# Patient Record
Sex: Female | Born: 1935 | Race: White | Hispanic: No | Smoking: Never smoker
Health system: Southern US, Community
[De-identification: ages and names within clinical notes are randomized; demographics above are authoritative.]

## PROBLEM LIST (undated history)

## (undated) DIAGNOSIS — R519 Headache, unspecified: Secondary | ICD-10-CM

## (undated) DIAGNOSIS — F419 Anxiety disorder, unspecified: Secondary | ICD-10-CM

## (undated) DIAGNOSIS — I10 Essential (primary) hypertension: Secondary | ICD-10-CM

## (undated) DIAGNOSIS — R51 Headache: Secondary | ICD-10-CM

## (undated) DIAGNOSIS — M1990 Unspecified osteoarthritis, unspecified site: Secondary | ICD-10-CM

## (undated) DIAGNOSIS — E039 Hypothyroidism, unspecified: Secondary | ICD-10-CM

## (undated) DIAGNOSIS — N189 Chronic kidney disease, unspecified: Secondary | ICD-10-CM

## (undated) DIAGNOSIS — R0600 Dyspnea, unspecified: Secondary | ICD-10-CM

## (undated) DIAGNOSIS — D649 Anemia, unspecified: Secondary | ICD-10-CM

## (undated) HISTORY — PX: APPENDECTOMY: SHX54

## (undated) HISTORY — PX: EYE SURGERY: SHX253

## (undated) HISTORY — PX: TONSILLECTOMY: SUR1361

---

## 1998-09-25 ENCOUNTER — Other Ambulatory Visit: Admission: RE | Admit: 1998-09-25 | Discharge: 1998-09-25 | Payer: Self-pay | Admitting: Family Medicine

## 1999-04-28 ENCOUNTER — Other Ambulatory Visit: Admission: RE | Admit: 1999-04-28 | Discharge: 1999-04-28 | Payer: Self-pay | Admitting: Obstetrics and Gynecology

## 1999-07-18 ENCOUNTER — Other Ambulatory Visit: Admission: RE | Admit: 1999-07-18 | Discharge: 1999-07-18 | Payer: Self-pay | Admitting: Obstetrics and Gynecology

## 1999-07-18 ENCOUNTER — Encounter (INDEPENDENT_AMBULATORY_CARE_PROVIDER_SITE_OTHER): Payer: Self-pay

## 1999-10-08 ENCOUNTER — Other Ambulatory Visit: Admission: RE | Admit: 1999-10-08 | Discharge: 1999-10-08 | Payer: Self-pay | Admitting: Family Medicine

## 2000-01-28 ENCOUNTER — Encounter: Admission: RE | Admit: 2000-01-28 | Discharge: 2000-01-28 | Payer: Self-pay | Admitting: Family Medicine

## 2000-01-28 ENCOUNTER — Encounter: Payer: Self-pay | Admitting: Family Medicine

## 2000-10-08 ENCOUNTER — Encounter: Admission: RE | Admit: 2000-10-08 | Discharge: 2000-10-08 | Payer: Self-pay | Admitting: Family Medicine

## 2000-10-08 ENCOUNTER — Other Ambulatory Visit: Admission: RE | Admit: 2000-10-08 | Discharge: 2000-10-08 | Payer: Self-pay | Admitting: Family Medicine

## 2000-10-08 ENCOUNTER — Encounter: Payer: Self-pay | Admitting: Family Medicine

## 2000-12-16 ENCOUNTER — Ambulatory Visit (HOSPITAL_COMMUNITY): Admission: RE | Admit: 2000-12-16 | Discharge: 2000-12-16 | Payer: Self-pay | Admitting: *Deleted

## 2001-01-28 ENCOUNTER — Encounter: Payer: Self-pay | Admitting: Family Medicine

## 2001-01-28 ENCOUNTER — Encounter: Admission: RE | Admit: 2001-01-28 | Discharge: 2001-01-28 | Payer: Self-pay | Admitting: Family Medicine

## 2001-11-14 ENCOUNTER — Other Ambulatory Visit: Admission: RE | Admit: 2001-11-14 | Discharge: 2001-11-14 | Payer: Self-pay | Admitting: Family Medicine

## 2002-01-31 ENCOUNTER — Encounter: Admission: RE | Admit: 2002-01-31 | Discharge: 2002-01-31 | Payer: Self-pay | Admitting: Family Medicine

## 2002-01-31 ENCOUNTER — Encounter: Payer: Self-pay | Admitting: Family Medicine

## 2002-04-13 ENCOUNTER — Encounter: Admission: RE | Admit: 2002-04-13 | Discharge: 2002-04-13 | Payer: Self-pay | Admitting: Family Medicine

## 2002-04-13 ENCOUNTER — Encounter: Payer: Self-pay | Admitting: Family Medicine

## 2002-12-06 ENCOUNTER — Other Ambulatory Visit: Admission: RE | Admit: 2002-12-06 | Discharge: 2002-12-06 | Payer: Self-pay | Admitting: Family Medicine

## 2003-04-18 ENCOUNTER — Encounter: Payer: Self-pay | Admitting: Family Medicine

## 2003-04-18 ENCOUNTER — Encounter: Admission: RE | Admit: 2003-04-18 | Discharge: 2003-04-18 | Payer: Self-pay | Admitting: Family Medicine

## 2004-04-24 ENCOUNTER — Encounter: Admission: RE | Admit: 2004-04-24 | Discharge: 2004-04-24 | Payer: Self-pay | Admitting: Family Medicine

## 2004-07-14 ENCOUNTER — Other Ambulatory Visit: Admission: RE | Admit: 2004-07-14 | Discharge: 2004-07-14 | Payer: Self-pay | Admitting: Family Medicine

## 2005-01-02 ENCOUNTER — Emergency Department (HOSPITAL_COMMUNITY): Admission: EM | Admit: 2005-01-02 | Discharge: 2005-01-02 | Payer: Self-pay

## 2005-05-11 ENCOUNTER — Encounter: Admission: RE | Admit: 2005-05-11 | Discharge: 2005-05-11 | Payer: Self-pay | Admitting: Family Medicine

## 2005-05-19 ENCOUNTER — Encounter: Admission: RE | Admit: 2005-05-19 | Discharge: 2005-05-19 | Payer: Self-pay | Admitting: Family Medicine

## 2006-06-10 ENCOUNTER — Encounter: Admission: RE | Admit: 2006-06-10 | Discharge: 2006-06-10 | Payer: Self-pay | Admitting: Family Medicine

## 2007-03-24 ENCOUNTER — Ambulatory Visit (HOSPITAL_COMMUNITY): Admission: RE | Admit: 2007-03-24 | Discharge: 2007-03-24 | Payer: Self-pay | Admitting: Orthopedic Surgery

## 2007-04-15 ENCOUNTER — Encounter: Admission: RE | Admit: 2007-04-15 | Discharge: 2007-07-14 | Payer: Self-pay | Admitting: Orthopedic Surgery

## 2007-06-13 ENCOUNTER — Encounter: Admission: RE | Admit: 2007-06-13 | Discharge: 2007-06-13 | Payer: Self-pay | Admitting: Family Medicine

## 2008-06-21 ENCOUNTER — Encounter: Admission: RE | Admit: 2008-06-21 | Discharge: 2008-06-21 | Payer: Self-pay | Admitting: Family Medicine

## 2008-06-29 ENCOUNTER — Encounter: Admission: RE | Admit: 2008-06-29 | Discharge: 2008-06-29 | Payer: Self-pay | Admitting: Family Medicine

## 2009-07-18 ENCOUNTER — Encounter: Admission: RE | Admit: 2009-07-18 | Discharge: 2009-07-18 | Payer: Self-pay | Admitting: Family Medicine

## 2010-07-21 ENCOUNTER — Encounter: Admission: RE | Admit: 2010-07-21 | Discharge: 2010-07-21 | Payer: Self-pay | Admitting: Family Medicine

## 2010-07-24 ENCOUNTER — Encounter: Admission: RE | Admit: 2010-07-24 | Discharge: 2010-07-24 | Payer: Self-pay | Admitting: Family Medicine

## 2010-12-28 ENCOUNTER — Encounter: Payer: Self-pay | Admitting: Family Medicine

## 2010-12-29 ENCOUNTER — Encounter: Payer: Self-pay | Admitting: Family Medicine

## 2011-04-24 NOTE — Op Note (Signed)
NAMESHELAGH, BROUSE                 ACCOUNT NO.:  [id number]   MEDICAL RECORD NO.:  [id number]          PATIENT TYPE:  AMB   LOCATION:  DAY                          FACILITY:  [facility]   PHYSICIAN:  Pietro Cassis. Alvan Dame, M.D.  DATE OF BIRTH:  1929-09-05   DATE OF PROCEDURE:  03/24/2007  DATE OF DISCHARGE:                               OPERATIVE REPORT   PREOPERATIVE DIAGNOSIS:  Right knee medial and lateral meniscal tear  associated with chondromalacia medially.   POSTOPERATIVE DIAGNOSES/FINDINGS:  1. Some grade 2-3 changes noted to the distal weightbearing surface of      the medial femoral condyle, not extensive and no evidence of      eburnated bone, tibial plateau surface appeared to be relatively      intact, perhaps some grade 2 changes at best.  2. Posterior horn medial meniscal tear.  3. Anterior horn mid body junction lateral meniscal tear.  4. Anterior medial synovitis.   PROCEDURES:  Right knee diagnostic and operative arthroscopy with a  1. Medial and lateral partial meniscectomies.  2. Limited synovectomy.  3. Medial chondroplasty.   SURGEON:  Pietro Cassis. Alvan Dame, M.D.   ASSISTANT:  None.   ANESTHESIA:  General plus locally administered lidocaine preoperatively  and postop Marcaine.   BLOOD LOSS:  None.   COMPLICATIONS:  None.   INDICATIONS:  Alicia Sellers is a very pleasant 77 year old female who I had  seen over year ago with right knee discomfort.  Based on radiographic  appearance of preserved joint spaces, she wished to identify the certain  pathology and this did indicate meniscal pathology.  We reviewed this  treatment options and she had tried conservative measures for some time  but had persistent and recurring mechanical type symptoms.  Given the  MRI findings, she wished to proceed with surgical intervention.  We  reviewed arthroscopic surgery versus knee replacement as an option.  She  wished to proceed that way.  Risks of persistent or progression of her  arthritis as well as recurring tears or symptoms inside the knee were  all reviewed as well as the  postop course and expectations, consent  obtained.   PROCEDURE IN DETAIL:  The patient was brought to the operative theater.  Once adequate anesthesia and appropriate preoperative antibiotics  administered, the patient was positioned supine with the right leg in a  leg holder, right lower extremity was prepped and draped in sterile  fashion.  Standard inferomedial, superomedial, and inferolateral portals  were utilized.  Diagnostic evaluation of the knee was carried out  revealing the above-noted findings.  Probe examination of the posterior  horn medial meniscal tear was identified.  Small biting basket was then  used to debride this area and then further contoured with a 3.5 shaver.  The 3.5 shaver was also used to debride the anterior medial synovium,  further exposing some anteromedial degenerative changes noted on the  femoral condyle.  This was debrided back primarily some of these flaps.  There was no microfracture carried out due to the fact there was some  preserved cartilage underlying this.  Laterally, the 3.5 shaver was used for debridement initially but I then  used a biting basket to trim out some of the junction of the mid body  anterior horn to further stabilize this.  The fragments were removed  with the shaver.  Anterior medial synovectomy was carried out revealing  a relatively intact patellofemoral compartment.   Final diagnosis for Alicia Sellers included relatively intact patellofemoral  lateral compartment, great 2-3 changes over the distal weightbearing  aspect and the anterior aspect of the femoral condyle with a relatively  intact tibial plateau surface at this time.   Following these findings, the instrumentation was removed.  Portals were  reapproximated using a 4-0 nylon.  I then injected the knee with 0.25%  Marcaine with epinephrine.  The patient entered had  her knee cleaned,  dried and dressed sterilely with a sterile bulky wrap.  She was brought  to the recovery room in stable condition.   I will see her back in the office in 10 days for routine follow-up.      Pietro Cassis Alvan Dame, M.D.  Electronically Signed     MDO/MEDQ  D:  03/24/2007  T:  03/24/2007  Job:  [id number]

## 2011-08-14 ENCOUNTER — Other Ambulatory Visit: Payer: Self-pay | Admitting: Family Medicine

## 2011-08-14 DIAGNOSIS — Z1231 Encounter for screening mammogram for malignant neoplasm of breast: Secondary | ICD-10-CM

## 2011-08-28 ENCOUNTER — Ambulatory Visit
Admission: RE | Admit: 2011-08-28 | Discharge: 2011-08-28 | Disposition: A | Discharge status: Home / Self Care | Payer: Managed Care, Other (non HMO) | Source: Ambulatory Visit | Attending: Family Medicine | Admitting: Family Medicine

## 2011-08-28 DIAGNOSIS — Z1231 Encounter for screening mammogram for malignant neoplasm of breast: Secondary | ICD-10-CM

## 2012-01-06 ENCOUNTER — Ambulatory Visit (INDEPENDENT_AMBULATORY_CARE_PROVIDER_SITE_OTHER): Payer: Managed Care, Other (non HMO) | Admitting: Family Medicine

## 2012-01-06 VITALS — BP 138/70 | Ht 61.0 in | Wt 163.0 lb

## 2012-01-06 DIAGNOSIS — M129 Arthropathy, unspecified: Secondary | ICD-10-CM

## 2012-01-06 DIAGNOSIS — M1990 Unspecified osteoarthritis, unspecified site: Secondary | ICD-10-CM | POA: Insufficient documentation

## 2012-01-06 MED ORDER — KETOPROFEN POWD: Status: DC

## 2012-01-06 NOTE — Patient Instructions (Signed)
1.  You can pick prescription at [facility] in [facility].  2.  You can use this gel on any of the places that you have arthritis, including your knee.  3. Follow up with me in 3-4 weeks.  4. Don't forget to do your hand squeezes everyday.

## 2012-01-06 NOTE — Progress Notes (Signed)
  Subjective:    Patient ID: Alicia Sellers, female    DOB: 1929-01-03, 78 y.o.   MRN: [id number]  HPI 78 y/o female is here c/o intermittent pain in the right thumb since October.  She has a history of arthritis in her hand but has not had pain in this particular area.  She is wondering if she injured herself using an exercise band last October because that is when the pain started.  She hasn't tried anything in particular for treatment.  She doesn't take any medication specifically for her arthritis because it doesn't bother her much over all    Review of Systems     Objective:   Physical Exam  There are changes c/w arthritis on multiple IP joints of both hands There is squaring of the CMC bilaterally, right greater than left. There is tenderness to palpation of the [facility] on the right that reproduces the pain in her chief complaint She has good range of motion at the wrist and hand The strength is normal PNVI  Ultrasound shows calcifications and spurring at the [facility].  There is a small fluid collection noted as well      Assessment & Plan:

## 2012-01-06 NOTE — Assessment & Plan Note (Signed)
We will start with topical ketoprofen.  Discussed other treatment options including oral anti-inflammatory meds and injection therapy.  She would like to try topicals first.  Additionally she will do hand ROM daily.

## 2012-02-01 ENCOUNTER — Ambulatory Visit (INDEPENDENT_AMBULATORY_CARE_PROVIDER_SITE_OTHER): Payer: Medicare Other | Admitting: Sports Medicine

## 2012-02-01 VITALS — BP 128/70

## 2012-02-01 DIAGNOSIS — M129 Arthropathy, unspecified: Secondary | ICD-10-CM

## 2012-02-01 DIAGNOSIS — M1990 Unspecified osteoarthritis, unspecified site: Secondary | ICD-10-CM

## 2012-02-01 NOTE — Progress Notes (Signed)
  Subjective:    Patient ID: Alicia Sellers, female    DOB: [birth year], 78 y.o.   MRN: [id number]  HPI 78 y/o female is here for follow up for arthritis.  She is improved with the ketaprofen gel and ROM HEP.  She has more good days than bad.   She is able to do normal activity  Pain is basically over thumbs and 1st CMC - distal hand feels OK  Not using heat although this feels good to her   Review of Systems     Objective:   Physical Exam Pleasant, NAD  There are changes c/w arthritis on multiple IP joints of both hands  There is squaring of the CMC bilaterally, right greater than left.  There is tenderness to palpation of the [facility] on the right that reproduces the pain in her chief complaint  She has good range of motion at the wrist and hand considering her arthritic changes        Assessment & Plan:

## 2012-02-01 NOTE — Assessment & Plan Note (Signed)
We will continue with ketoprofen gel and ROM, adding once daily hand therapy with warm water.  If her pain worsens we can consider other treatment options.

## 2012-02-01 NOTE — Patient Instructions (Signed)
1. Keep doing your hand motion with your stuffed toy.  2. Once a day do your hand motion in warm water.  3. You can use gel on any joint that bothers you.  4. Come back to see [place] if you have any other joint or muscle problems.

## 2012-08-17 ENCOUNTER — Other Ambulatory Visit: Payer: Self-pay | Admitting: Family Medicine

## 2012-08-17 DIAGNOSIS — Z1231 Encounter for screening mammogram for malignant neoplasm of breast: Secondary | ICD-10-CM

## 2012-08-29 ENCOUNTER — Ambulatory Visit: Payer: Medicare Other

## 2012-09-05 ENCOUNTER — Ambulatory Visit
Admission: RE | Admit: 2012-09-05 | Discharge: 2012-09-05 | Disposition: A | Discharge status: Home / Self Care | Payer: Medicare Other | Source: Ambulatory Visit | Attending: Family Medicine | Admitting: Family Medicine

## 2012-09-05 DIAGNOSIS — Z1231 Encounter for screening mammogram for malignant neoplasm of breast: Secondary | ICD-10-CM

## 2012-12-13 DIAGNOSIS — L608 Other nail disorders: Secondary | ICD-10-CM | POA: Diagnosis not present

## 2012-12-14 DIAGNOSIS — M949 Disorder of cartilage, unspecified: Secondary | ICD-10-CM | POA: Diagnosis not present

## 2012-12-14 DIAGNOSIS — N184 Chronic kidney disease, stage 4 (severe): Secondary | ICD-10-CM | POA: Diagnosis not present

## 2012-12-14 DIAGNOSIS — I10 Essential (primary) hypertension: Secondary | ICD-10-CM | POA: Diagnosis not present

## 2012-12-14 DIAGNOSIS — Z1331 Encounter for screening for depression: Secondary | ICD-10-CM | POA: Diagnosis not present

## 2012-12-14 DIAGNOSIS — E7800 Pure hypercholesterolemia, unspecified: Secondary | ICD-10-CM | POA: Diagnosis not present

## 2012-12-14 DIAGNOSIS — Z79899 Other long term (current) drug therapy: Secondary | ICD-10-CM | POA: Diagnosis not present

## 2012-12-14 DIAGNOSIS — R42 Dizziness and giddiness: Secondary | ICD-10-CM | POA: Diagnosis not present

## 2012-12-14 DIAGNOSIS — M899 Disorder of bone, unspecified: Secondary | ICD-10-CM | POA: Diagnosis not present

## 2012-12-16 DIAGNOSIS — E875 Hyperkalemia: Secondary | ICD-10-CM | POA: Diagnosis not present

## 2012-12-19 ENCOUNTER — Other Ambulatory Visit: Payer: Self-pay | Admitting: Family Medicine

## 2012-12-19 ENCOUNTER — Ambulatory Visit
Admission: RE | Admit: 2012-12-19 | Discharge: 2012-12-19 | Disposition: A | Discharge status: Home / Self Care | Payer: Medicare Other | Source: Ambulatory Visit | Attending: Family Medicine | Admitting: Family Medicine

## 2012-12-19 DIAGNOSIS — N184 Chronic kidney disease, stage 4 (severe): Secondary | ICD-10-CM

## 2012-12-19 DIAGNOSIS — N281 Cyst of kidney, acquired: Secondary | ICD-10-CM | POA: Diagnosis not present

## 2012-12-19 DIAGNOSIS — I129 Hypertensive chronic kidney disease with stage 1 through stage 4 chronic kidney disease, or unspecified chronic kidney disease: Secondary | ICD-10-CM | POA: Diagnosis not present

## 2012-12-20 ENCOUNTER — Other Ambulatory Visit: Payer: Medicare Other

## 2013-03-14 DIAGNOSIS — L608 Other nail disorders: Secondary | ICD-10-CM | POA: Diagnosis not present

## 2013-03-20 DIAGNOSIS — R109 Unspecified abdominal pain: Secondary | ICD-10-CM | POA: Diagnosis not present

## 2013-05-19 DIAGNOSIS — I10 Essential (primary) hypertension: Secondary | ICD-10-CM | POA: Diagnosis not present

## 2013-05-22 DIAGNOSIS — R350 Frequency of micturition: Secondary | ICD-10-CM | POA: Diagnosis not present

## 2013-05-22 DIAGNOSIS — I10 Essential (primary) hypertension: Secondary | ICD-10-CM | POA: Diagnosis not present

## 2013-06-08 DIAGNOSIS — D485 Neoplasm of uncertain behavior of skin: Secondary | ICD-10-CM | POA: Diagnosis not present

## 2013-06-08 DIAGNOSIS — M899 Disorder of bone, unspecified: Secondary | ICD-10-CM | POA: Diagnosis not present

## 2013-06-08 DIAGNOSIS — N184 Chronic kidney disease, stage 4 (severe): Secondary | ICD-10-CM | POA: Diagnosis not present

## 2013-06-08 DIAGNOSIS — Z79899 Other long term (current) drug therapy: Secondary | ICD-10-CM | POA: Diagnosis not present

## 2013-06-08 DIAGNOSIS — I10 Essential (primary) hypertension: Secondary | ICD-10-CM | POA: Diagnosis not present

## 2013-06-08 DIAGNOSIS — D492 Neoplasm of unspecified behavior of bone, soft tissue, and skin: Secondary | ICD-10-CM | POA: Diagnosis not present

## 2013-06-08 DIAGNOSIS — E7800 Pure hypercholesterolemia, unspecified: Secondary | ICD-10-CM | POA: Diagnosis not present

## 2013-06-13 DIAGNOSIS — L608 Other nail disorders: Secondary | ICD-10-CM | POA: Diagnosis not present

## 2013-07-10 DIAGNOSIS — H2510 Age-related nuclear cataract, unspecified eye: Secondary | ICD-10-CM | POA: Diagnosis not present

## 2013-08-31 ENCOUNTER — Other Ambulatory Visit: Payer: Self-pay

## 2013-08-31 DIAGNOSIS — Z1231 Encounter for screening mammogram for malignant neoplasm of breast: Secondary | ICD-10-CM

## 2013-09-15 DIAGNOSIS — Z23 Encounter for immunization: Secondary | ICD-10-CM | POA: Diagnosis not present

## 2013-09-18 ENCOUNTER — Ambulatory Visit
Admission: RE | Admit: 2013-09-18 | Discharge: 2013-09-18 | Disposition: A | Discharge status: Home / Self Care | Payer: Medicare Other | Source: Ambulatory Visit

## 2013-09-18 DIAGNOSIS — Z1231 Encounter for screening mammogram for malignant neoplasm of breast: Secondary | ICD-10-CM | POA: Diagnosis not present

## 2013-09-24 DIAGNOSIS — L2089 Other atopic dermatitis: Secondary | ICD-10-CM | POA: Diagnosis not present

## 2013-10-18 DIAGNOSIS — R51 Headache: Secondary | ICD-10-CM | POA: Diagnosis not present

## 2013-10-18 DIAGNOSIS — N184 Chronic kidney disease, stage 4 (severe): Secondary | ICD-10-CM | POA: Diagnosis not present

## 2013-10-18 DIAGNOSIS — I129 Hypertensive chronic kidney disease with stage 1 through stage 4 chronic kidney disease, or unspecified chronic kidney disease: Secondary | ICD-10-CM | POA: Diagnosis not present

## 2013-10-24 DIAGNOSIS — H8110 Benign paroxysmal vertigo, unspecified ear: Secondary | ICD-10-CM | POA: Diagnosis not present

## 2013-10-24 DIAGNOSIS — H6120 Impacted cerumen, unspecified ear: Secondary | ICD-10-CM | POA: Diagnosis not present

## 2013-10-31 DIAGNOSIS — H6980 Other specified disorders of Eustachian tube, unspecified ear: Secondary | ICD-10-CM | POA: Diagnosis not present

## 2013-10-31 DIAGNOSIS — H6120 Impacted cerumen, unspecified ear: Secondary | ICD-10-CM | POA: Diagnosis not present

## 2013-11-14 DIAGNOSIS — H6980 Other specified disorders of Eustachian tube, unspecified ear: Secondary | ICD-10-CM | POA: Diagnosis not present

## 2013-12-07 HISTORY — PX: BREAST BIOPSY: SHX20

## 2013-12-15 DIAGNOSIS — M949 Disorder of cartilage, unspecified: Secondary | ICD-10-CM | POA: Diagnosis not present

## 2013-12-15 DIAGNOSIS — Z79899 Other long term (current) drug therapy: Secondary | ICD-10-CM | POA: Diagnosis not present

## 2013-12-15 DIAGNOSIS — E7800 Pure hypercholesterolemia, unspecified: Secondary | ICD-10-CM | POA: Diagnosis not present

## 2013-12-15 DIAGNOSIS — M899 Disorder of bone, unspecified: Secondary | ICD-10-CM | POA: Diagnosis not present

## 2013-12-15 DIAGNOSIS — N184 Chronic kidney disease, stage 4 (severe): Secondary | ICD-10-CM | POA: Diagnosis not present

## 2013-12-15 DIAGNOSIS — I129 Hypertensive chronic kidney disease with stage 1 through stage 4 chronic kidney disease, or unspecified chronic kidney disease: Secondary | ICD-10-CM | POA: Diagnosis not present

## 2013-12-26 DIAGNOSIS — M949 Disorder of cartilage, unspecified: Secondary | ICD-10-CM | POA: Diagnosis not present

## 2013-12-26 DIAGNOSIS — M899 Disorder of bone, unspecified: Secondary | ICD-10-CM | POA: Diagnosis not present

## 2014-01-01 DIAGNOSIS — E875 Hyperkalemia: Secondary | ICD-10-CM | POA: Diagnosis not present

## 2014-01-17 DIAGNOSIS — D631 Anemia in chronic kidney disease: Secondary | ICD-10-CM | POA: Diagnosis not present

## 2014-01-17 DIAGNOSIS — N2581 Secondary hyperparathyroidism of renal origin: Secondary | ICD-10-CM | POA: Diagnosis not present

## 2014-01-17 DIAGNOSIS — N183 Chronic kidney disease, stage 3 unspecified: Secondary | ICD-10-CM | POA: Diagnosis not present

## 2014-01-17 DIAGNOSIS — N039 Chronic nephritic syndrome with unspecified morphologic changes: Secondary | ICD-10-CM | POA: Diagnosis not present

## 2014-02-05 DIAGNOSIS — E875 Hyperkalemia: Secondary | ICD-10-CM | POA: Diagnosis not present

## 2014-03-23 DIAGNOSIS — M76899 Other specified enthesopathies of unspecified lower limb, excluding foot: Secondary | ICD-10-CM | POA: Diagnosis not present

## 2014-04-24 DIAGNOSIS — N039 Chronic nephritic syndrome with unspecified morphologic changes: Secondary | ICD-10-CM | POA: Diagnosis not present

## 2014-04-24 DIAGNOSIS — N183 Chronic kidney disease, stage 3 unspecified: Secondary | ICD-10-CM | POA: Diagnosis not present

## 2014-04-24 DIAGNOSIS — D631 Anemia in chronic kidney disease: Secondary | ICD-10-CM | POA: Diagnosis not present

## 2014-04-24 DIAGNOSIS — N2581 Secondary hyperparathyroidism of renal origin: Secondary | ICD-10-CM | POA: Diagnosis not present

## 2014-06-14 DIAGNOSIS — I129 Hypertensive chronic kidney disease with stage 1 through stage 4 chronic kidney disease, or unspecified chronic kidney disease: Secondary | ICD-10-CM | POA: Diagnosis not present

## 2014-06-14 DIAGNOSIS — I998 Other disorder of circulatory system: Secondary | ICD-10-CM | POA: Diagnosis not present

## 2014-06-14 DIAGNOSIS — M899 Disorder of bone, unspecified: Secondary | ICD-10-CM | POA: Diagnosis not present

## 2014-06-14 DIAGNOSIS — Z23 Encounter for immunization: Secondary | ICD-10-CM | POA: Diagnosis not present

## 2014-06-14 DIAGNOSIS — M949 Disorder of cartilage, unspecified: Secondary | ICD-10-CM | POA: Diagnosis not present

## 2014-06-14 DIAGNOSIS — Z1331 Encounter for screening for depression: Secondary | ICD-10-CM | POA: Diagnosis not present

## 2014-06-14 DIAGNOSIS — E7800 Pure hypercholesterolemia, unspecified: Secondary | ICD-10-CM | POA: Diagnosis not present

## 2014-06-14 DIAGNOSIS — N184 Chronic kidney disease, stage 4 (severe): Secondary | ICD-10-CM | POA: Diagnosis not present

## 2014-08-01 DIAGNOSIS — M461 Sacroiliitis, not elsewhere classified: Secondary | ICD-10-CM | POA: Diagnosis not present

## 2014-08-09 DIAGNOSIS — M533 Sacrococcygeal disorders, not elsewhere classified: Secondary | ICD-10-CM | POA: Diagnosis not present

## 2014-08-10 DIAGNOSIS — M533 Sacrococcygeal disorders, not elsewhere classified: Secondary | ICD-10-CM | POA: Diagnosis not present

## 2014-08-15 DIAGNOSIS — M533 Sacrococcygeal disorders, not elsewhere classified: Secondary | ICD-10-CM | POA: Diagnosis not present

## 2014-08-17 DIAGNOSIS — M533 Sacrococcygeal disorders, not elsewhere classified: Secondary | ICD-10-CM | POA: Diagnosis not present

## 2014-08-20 DIAGNOSIS — M533 Sacrococcygeal disorders, not elsewhere classified: Secondary | ICD-10-CM | POA: Diagnosis not present

## 2014-08-24 ENCOUNTER — Other Ambulatory Visit: Payer: Self-pay

## 2014-08-24 DIAGNOSIS — M533 Sacrococcygeal disorders, not elsewhere classified: Secondary | ICD-10-CM | POA: Diagnosis not present

## 2014-08-24 DIAGNOSIS — Z1231 Encounter for screening mammogram for malignant neoplasm of breast: Secondary | ICD-10-CM

## 2014-08-29 DIAGNOSIS — M533 Sacrococcygeal disorders, not elsewhere classified: Secondary | ICD-10-CM | POA: Diagnosis not present

## 2014-08-31 DIAGNOSIS — M533 Sacrococcygeal disorders, not elsewhere classified: Secondary | ICD-10-CM | POA: Diagnosis not present

## 2014-09-03 DIAGNOSIS — M533 Sacrococcygeal disorders, not elsewhere classified: Secondary | ICD-10-CM | POA: Diagnosis not present

## 2014-09-07 DIAGNOSIS — M545 Low back pain: Secondary | ICD-10-CM | POA: Diagnosis not present

## 2014-09-12 DIAGNOSIS — M545 Low back pain: Secondary | ICD-10-CM | POA: Diagnosis not present

## 2014-09-18 DIAGNOSIS — M545 Low back pain: Secondary | ICD-10-CM | POA: Diagnosis not present

## 2014-09-20 DIAGNOSIS — H2513 Age-related nuclear cataract, bilateral: Secondary | ICD-10-CM | POA: Diagnosis not present

## 2014-09-21 ENCOUNTER — Ambulatory Visit
Admission: RE | Admit: 2014-09-21 | Discharge: 2014-09-21 | Disposition: A | Discharge status: Home / Self Care | Payer: Medicare Other | Source: Ambulatory Visit

## 2014-09-21 ENCOUNTER — Encounter (INDEPENDENT_AMBULATORY_CARE_PROVIDER_SITE_OTHER): Payer: Self-pay

## 2014-09-21 DIAGNOSIS — Z1231 Encounter for screening mammogram for malignant neoplasm of breast: Secondary | ICD-10-CM | POA: Diagnosis not present

## 2014-09-21 DIAGNOSIS — Z23 Encounter for immunization: Secondary | ICD-10-CM | POA: Diagnosis not present

## 2014-09-26 ENCOUNTER — Other Ambulatory Visit: Payer: Self-pay | Admitting: Family Medicine

## 2014-09-26 DIAGNOSIS — R928 Other abnormal and inconclusive findings on diagnostic imaging of breast: Secondary | ICD-10-CM

## 2014-10-10 ENCOUNTER — Ambulatory Visit
Admission: RE | Admit: 2014-10-10 | Discharge: 2014-10-10 | Disposition: A | Discharge status: Home / Self Care | Payer: Medicare Other | Source: Ambulatory Visit | Attending: Family Medicine | Admitting: Family Medicine

## 2014-10-10 ENCOUNTER — Other Ambulatory Visit: Payer: Self-pay | Admitting: Family Medicine

## 2014-10-10 DIAGNOSIS — R928 Other abnormal and inconclusive findings on diagnostic imaging of breast: Secondary | ICD-10-CM

## 2014-10-10 DIAGNOSIS — R921 Mammographic calcification found on diagnostic imaging of breast: Secondary | ICD-10-CM

## 2014-10-19 ENCOUNTER — Ambulatory Visit
Admission: RE | Admit: 2014-10-19 | Discharge: 2014-10-19 | Disposition: A | Discharge status: Home / Self Care | Payer: Medicare Other | Source: Ambulatory Visit | Attending: Family Medicine | Admitting: Family Medicine

## 2014-10-19 DIAGNOSIS — R921 Mammographic calcification found on diagnostic imaging of breast: Secondary | ICD-10-CM | POA: Diagnosis not present

## 2014-10-19 DIAGNOSIS — N6012 Diffuse cystic mastopathy of left breast: Secondary | ICD-10-CM | POA: Diagnosis not present

## 2014-12-18 DIAGNOSIS — M1990 Unspecified osteoarthritis, unspecified site: Secondary | ICD-10-CM | POA: Diagnosis not present

## 2014-12-18 DIAGNOSIS — R634 Abnormal weight loss: Secondary | ICD-10-CM | POA: Diagnosis not present

## 2014-12-18 DIAGNOSIS — D649 Anemia, unspecified: Secondary | ICD-10-CM | POA: Diagnosis not present

## 2014-12-18 DIAGNOSIS — N184 Chronic kidney disease, stage 4 (severe): Secondary | ICD-10-CM | POA: Diagnosis not present

## 2014-12-18 DIAGNOSIS — I129 Hypertensive chronic kidney disease with stage 1 through stage 4 chronic kidney disease, or unspecified chronic kidney disease: Secondary | ICD-10-CM | POA: Diagnosis not present

## 2014-12-18 DIAGNOSIS — E780 Pure hypercholesterolemia: Secondary | ICD-10-CM | POA: Diagnosis not present

## 2015-03-20 DIAGNOSIS — E039 Hypothyroidism, unspecified: Secondary | ICD-10-CM | POA: Diagnosis not present

## 2015-05-22 DIAGNOSIS — D631 Anemia in chronic kidney disease: Secondary | ICD-10-CM | POA: Diagnosis not present

## 2015-05-22 DIAGNOSIS — N189 Chronic kidney disease, unspecified: Secondary | ICD-10-CM | POA: Diagnosis not present

## 2015-05-22 DIAGNOSIS — N183 Chronic kidney disease, stage 3 (moderate): Secondary | ICD-10-CM | POA: Diagnosis not present

## 2015-05-22 DIAGNOSIS — N2581 Secondary hyperparathyroidism of renal origin: Secondary | ICD-10-CM | POA: Diagnosis not present

## 2015-05-23 DIAGNOSIS — D631 Anemia in chronic kidney disease: Secondary | ICD-10-CM | POA: Diagnosis not present

## 2015-08-28 DIAGNOSIS — E780 Pure hypercholesterolemia: Secondary | ICD-10-CM | POA: Diagnosis not present

## 2015-08-28 DIAGNOSIS — Z0000 Encounter for general adult medical examination without abnormal findings: Secondary | ICD-10-CM | POA: Diagnosis not present

## 2015-08-28 DIAGNOSIS — E039 Hypothyroidism, unspecified: Secondary | ICD-10-CM | POA: Diagnosis not present

## 2015-08-28 DIAGNOSIS — N184 Chronic kidney disease, stage 4 (severe): Secondary | ICD-10-CM | POA: Diagnosis not present

## 2015-08-28 DIAGNOSIS — D692 Other nonthrombocytopenic purpura: Secondary | ICD-10-CM | POA: Diagnosis not present

## 2015-08-28 DIAGNOSIS — M1990 Unspecified osteoarthritis, unspecified site: Secondary | ICD-10-CM | POA: Diagnosis not present

## 2015-08-28 DIAGNOSIS — I129 Hypertensive chronic kidney disease with stage 1 through stage 4 chronic kidney disease, or unspecified chronic kidney disease: Secondary | ICD-10-CM | POA: Diagnosis not present

## 2015-08-28 DIAGNOSIS — M8588 Other specified disorders of bone density and structure, other site: Secondary | ICD-10-CM | POA: Diagnosis not present

## 2015-09-26 ENCOUNTER — Other Ambulatory Visit: Payer: Self-pay

## 2015-09-26 DIAGNOSIS — Z1231 Encounter for screening mammogram for malignant neoplasm of breast: Secondary | ICD-10-CM

## 2015-10-21 DIAGNOSIS — H2512 Age-related nuclear cataract, left eye: Secondary | ICD-10-CM | POA: Diagnosis not present

## 2015-10-21 DIAGNOSIS — H2513 Age-related nuclear cataract, bilateral: Secondary | ICD-10-CM | POA: Diagnosis not present

## 2015-10-23 DIAGNOSIS — H2512 Age-related nuclear cataract, left eye: Secondary | ICD-10-CM | POA: Diagnosis not present

## 2015-10-25 ENCOUNTER — Ambulatory Visit
Admission: RE | Admit: 2015-10-25 | Discharge: 2015-10-25 | Disposition: A | Discharge status: Home / Self Care | Payer: Medicare Other | Source: Ambulatory Visit

## 2015-10-25 DIAGNOSIS — Z1231 Encounter for screening mammogram for malignant neoplasm of breast: Secondary | ICD-10-CM

## 2015-11-06 DIAGNOSIS — H2512 Age-related nuclear cataract, left eye: Secondary | ICD-10-CM | POA: Diagnosis not present

## 2015-11-11 DIAGNOSIS — H2511 Age-related nuclear cataract, right eye: Secondary | ICD-10-CM | POA: Diagnosis not present

## 2015-11-13 DIAGNOSIS — H2511 Age-related nuclear cataract, right eye: Secondary | ICD-10-CM | POA: Diagnosis not present

## 2015-11-13 DIAGNOSIS — H5703 Miosis: Secondary | ICD-10-CM | POA: Diagnosis not present

## 2015-11-13 DIAGNOSIS — H21561 Pupillary abnormality, right eye: Secondary | ICD-10-CM | POA: Diagnosis not present

## 2016-02-24 DIAGNOSIS — E039 Hypothyroidism, unspecified: Secondary | ICD-10-CM | POA: Diagnosis not present

## 2016-02-24 DIAGNOSIS — N184 Chronic kidney disease, stage 4 (severe): Secondary | ICD-10-CM | POA: Diagnosis not present

## 2016-02-24 DIAGNOSIS — M8580 Other specified disorders of bone density and structure, unspecified site: Secondary | ICD-10-CM | POA: Diagnosis not present

## 2016-02-24 DIAGNOSIS — M1990 Unspecified osteoarthritis, unspecified site: Secondary | ICD-10-CM | POA: Diagnosis not present

## 2016-02-24 DIAGNOSIS — I129 Hypertensive chronic kidney disease with stage 1 through stage 4 chronic kidney disease, or unspecified chronic kidney disease: Secondary | ICD-10-CM | POA: Diagnosis not present

## 2016-02-24 DIAGNOSIS — E7800 Pure hypercholesterolemia, unspecified: Secondary | ICD-10-CM | POA: Diagnosis not present

## 2016-02-24 DIAGNOSIS — D692 Other nonthrombocytopenic purpura: Secondary | ICD-10-CM | POA: Diagnosis not present

## 2016-03-19 DIAGNOSIS — Z961 Presence of intraocular lens: Secondary | ICD-10-CM | POA: Diagnosis not present

## 2016-03-31 DIAGNOSIS — M810 Age-related osteoporosis without current pathological fracture: Secondary | ICD-10-CM | POA: Diagnosis not present

## 2016-04-09 DIAGNOSIS — M810 Age-related osteoporosis without current pathological fracture: Secondary | ICD-10-CM | POA: Diagnosis not present

## 2016-04-22 DIAGNOSIS — E039 Hypothyroidism, unspecified: Secondary | ICD-10-CM | POA: Diagnosis not present

## 2016-04-30 DIAGNOSIS — M810 Age-related osteoporosis without current pathological fracture: Secondary | ICD-10-CM | POA: Diagnosis not present

## 2016-07-29 DIAGNOSIS — D631 Anemia in chronic kidney disease: Secondary | ICD-10-CM | POA: Diagnosis not present

## 2016-07-29 DIAGNOSIS — N183 Chronic kidney disease, stage 3 (moderate): Secondary | ICD-10-CM | POA: Diagnosis not present

## 2016-07-29 DIAGNOSIS — N2581 Secondary hyperparathyroidism of renal origin: Secondary | ICD-10-CM | POA: Diagnosis not present

## 2016-07-29 DIAGNOSIS — N189 Chronic kidney disease, unspecified: Secondary | ICD-10-CM | POA: Diagnosis not present

## 2016-07-30 DIAGNOSIS — Z23 Encounter for immunization: Secondary | ICD-10-CM | POA: Diagnosis not present

## 2016-09-01 DIAGNOSIS — N183 Chronic kidney disease, stage 3 (moderate): Secondary | ICD-10-CM | POA: Diagnosis not present

## 2016-09-11 DIAGNOSIS — E7800 Pure hypercholesterolemia, unspecified: Secondary | ICD-10-CM | POA: Diagnosis not present

## 2016-09-11 DIAGNOSIS — D692 Other nonthrombocytopenic purpura: Secondary | ICD-10-CM | POA: Diagnosis not present

## 2016-09-11 DIAGNOSIS — E039 Hypothyroidism, unspecified: Secondary | ICD-10-CM | POA: Diagnosis not present

## 2016-09-11 DIAGNOSIS — M1990 Unspecified osteoarthritis, unspecified site: Secondary | ICD-10-CM | POA: Diagnosis not present

## 2016-09-11 DIAGNOSIS — M810 Age-related osteoporosis without current pathological fracture: Secondary | ICD-10-CM | POA: Diagnosis not present

## 2016-09-11 DIAGNOSIS — D492 Neoplasm of unspecified behavior of bone, soft tissue, and skin: Secondary | ICD-10-CM | POA: Diagnosis not present

## 2016-09-11 DIAGNOSIS — Z1389 Encounter for screening for other disorder: Secondary | ICD-10-CM | POA: Diagnosis not present

## 2016-09-11 DIAGNOSIS — I129 Hypertensive chronic kidney disease with stage 1 through stage 4 chronic kidney disease, or unspecified chronic kidney disease: Secondary | ICD-10-CM | POA: Diagnosis not present

## 2016-09-11 DIAGNOSIS — N184 Chronic kidney disease, stage 4 (severe): Secondary | ICD-10-CM | POA: Diagnosis not present

## 2016-09-30 ENCOUNTER — Other Ambulatory Visit: Payer: Self-pay | Admitting: Family Medicine

## 2016-09-30 DIAGNOSIS — Z1231 Encounter for screening mammogram for malignant neoplasm of breast: Secondary | ICD-10-CM

## 2016-10-01 ENCOUNTER — Other Ambulatory Visit: Payer: Self-pay | Admitting: Family Medicine

## 2016-10-01 DIAGNOSIS — D0462 Carcinoma in situ of skin of left upper limb, including shoulder: Secondary | ICD-10-CM | POA: Diagnosis not present

## 2016-10-21 ENCOUNTER — Other Ambulatory Visit: Payer: Self-pay | Admitting: Family Medicine

## 2016-10-21 DIAGNOSIS — D0462 Carcinoma in situ of skin of left upper limb, including shoulder: Secondary | ICD-10-CM | POA: Diagnosis not present

## 2016-10-21 DIAGNOSIS — L905 Scar conditions and fibrosis of skin: Secondary | ICD-10-CM | POA: Diagnosis not present

## 2016-10-26 ENCOUNTER — Ambulatory Visit
Admission: RE | Admit: 2016-10-26 | Discharge: 2016-10-26 | Disposition: A | Discharge status: Home / Self Care | Payer: Medicare Other | Source: Ambulatory Visit | Attending: Family Medicine | Admitting: Family Medicine

## 2016-10-26 DIAGNOSIS — Z1231 Encounter for screening mammogram for malignant neoplasm of breast: Secondary | ICD-10-CM | POA: Diagnosis not present

## 2016-11-02 DIAGNOSIS — D049 Carcinoma in situ of skin, unspecified: Secondary | ICD-10-CM | POA: Diagnosis not present

## 2016-11-02 DIAGNOSIS — M810 Age-related osteoporosis without current pathological fracture: Secondary | ICD-10-CM | POA: Diagnosis not present

## 2017-03-22 DIAGNOSIS — H524 Presbyopia: Secondary | ICD-10-CM | POA: Diagnosis not present

## 2017-03-22 DIAGNOSIS — Z961 Presence of intraocular lens: Secondary | ICD-10-CM | POA: Diagnosis not present

## 2017-04-02 DIAGNOSIS — E7800 Pure hypercholesterolemia, unspecified: Secondary | ICD-10-CM | POA: Diagnosis not present

## 2017-04-02 DIAGNOSIS — N184 Chronic kidney disease, stage 4 (severe): Secondary | ICD-10-CM | POA: Diagnosis not present

## 2017-04-02 DIAGNOSIS — M1990 Unspecified osteoarthritis, unspecified site: Secondary | ICD-10-CM | POA: Diagnosis not present

## 2017-04-02 DIAGNOSIS — I129 Hypertensive chronic kidney disease with stage 1 through stage 4 chronic kidney disease, or unspecified chronic kidney disease: Secondary | ICD-10-CM | POA: Diagnosis not present

## 2017-04-02 DIAGNOSIS — M810 Age-related osteoporosis without current pathological fracture: Secondary | ICD-10-CM | POA: Diagnosis not present

## 2017-04-02 DIAGNOSIS — E039 Hypothyroidism, unspecified: Secondary | ICD-10-CM | POA: Diagnosis not present

## 2017-04-02 DIAGNOSIS — D692 Other nonthrombocytopenic purpura: Secondary | ICD-10-CM | POA: Diagnosis not present

## 2017-04-15 DIAGNOSIS — I129 Hypertensive chronic kidney disease with stage 1 through stage 4 chronic kidney disease, or unspecified chronic kidney disease: Secondary | ICD-10-CM | POA: Diagnosis not present

## 2017-04-15 DIAGNOSIS — N184 Chronic kidney disease, stage 4 (severe): Secondary | ICD-10-CM | POA: Diagnosis not present

## 2017-04-20 DIAGNOSIS — N183 Chronic kidney disease, stage 3 (moderate): Secondary | ICD-10-CM | POA: Diagnosis not present

## 2017-04-20 DIAGNOSIS — D631 Anemia in chronic kidney disease: Secondary | ICD-10-CM | POA: Diagnosis not present

## 2017-04-20 DIAGNOSIS — N2581 Secondary hyperparathyroidism of renal origin: Secondary | ICD-10-CM | POA: Diagnosis not present

## 2017-05-11 DIAGNOSIS — M810 Age-related osteoporosis without current pathological fracture: Secondary | ICD-10-CM | POA: Diagnosis not present

## 2017-05-11 DIAGNOSIS — I129 Hypertensive chronic kidney disease with stage 1 through stage 4 chronic kidney disease, or unspecified chronic kidney disease: Secondary | ICD-10-CM | POA: Diagnosis not present

## 2017-06-22 DIAGNOSIS — I129 Hypertensive chronic kidney disease with stage 1 through stage 4 chronic kidney disease, or unspecified chronic kidney disease: Secondary | ICD-10-CM | POA: Diagnosis not present

## 2017-07-16 DIAGNOSIS — N184 Chronic kidney disease, stage 4 (severe): Secondary | ICD-10-CM | POA: Diagnosis not present

## 2017-08-03 DIAGNOSIS — Z23 Encounter for immunization: Secondary | ICD-10-CM | POA: Diagnosis not present

## 2017-09-21 ENCOUNTER — Other Ambulatory Visit: Payer: Self-pay | Admitting: Family Medicine

## 2017-09-21 DIAGNOSIS — Z1231 Encounter for screening mammogram for malignant neoplasm of breast: Secondary | ICD-10-CM

## 2017-10-07 DIAGNOSIS — M810 Age-related osteoporosis without current pathological fracture: Secondary | ICD-10-CM | POA: Diagnosis not present

## 2017-10-07 DIAGNOSIS — Z1389 Encounter for screening for other disorder: Secondary | ICD-10-CM | POA: Diagnosis not present

## 2017-10-07 DIAGNOSIS — M1990 Unspecified osteoarthritis, unspecified site: Secondary | ICD-10-CM | POA: Diagnosis not present

## 2017-10-07 DIAGNOSIS — N184 Chronic kidney disease, stage 4 (severe): Secondary | ICD-10-CM | POA: Diagnosis not present

## 2017-10-07 DIAGNOSIS — E039 Hypothyroidism, unspecified: Secondary | ICD-10-CM | POA: Diagnosis not present

## 2017-10-07 DIAGNOSIS — E7800 Pure hypercholesterolemia, unspecified: Secondary | ICD-10-CM | POA: Diagnosis not present

## 2017-10-07 DIAGNOSIS — I129 Hypertensive chronic kidney disease with stage 1 through stage 4 chronic kidney disease, or unspecified chronic kidney disease: Secondary | ICD-10-CM | POA: Diagnosis not present

## 2017-10-26 DIAGNOSIS — E039 Hypothyroidism, unspecified: Secondary | ICD-10-CM | POA: Diagnosis not present

## 2017-10-26 DIAGNOSIS — M1990 Unspecified osteoarthritis, unspecified site: Secondary | ICD-10-CM | POA: Diagnosis not present

## 2017-10-26 DIAGNOSIS — N184 Chronic kidney disease, stage 4 (severe): Secondary | ICD-10-CM | POA: Diagnosis not present

## 2017-10-26 DIAGNOSIS — I129 Hypertensive chronic kidney disease with stage 1 through stage 4 chronic kidney disease, or unspecified chronic kidney disease: Secondary | ICD-10-CM | POA: Diagnosis not present

## 2017-10-26 DIAGNOSIS — M810 Age-related osteoporosis without current pathological fracture: Secondary | ICD-10-CM | POA: Diagnosis not present

## 2017-11-03 DIAGNOSIS — J069 Acute upper respiratory infection, unspecified: Secondary | ICD-10-CM | POA: Diagnosis not present

## 2017-11-11 ENCOUNTER — Ambulatory Visit
Admission: RE | Admit: 2017-11-11 | Discharge: 2017-11-11 | Disposition: A | Discharge status: Home / Self Care | Payer: Medicare Other | Source: Ambulatory Visit | Attending: Family Medicine | Admitting: Family Medicine

## 2017-11-11 DIAGNOSIS — M810 Age-related osteoporosis without current pathological fracture: Secondary | ICD-10-CM | POA: Diagnosis not present

## 2017-11-11 DIAGNOSIS — Z1231 Encounter for screening mammogram for malignant neoplasm of breast: Secondary | ICD-10-CM | POA: Diagnosis not present

## 2017-11-24 DIAGNOSIS — N184 Chronic kidney disease, stage 4 (severe): Secondary | ICD-10-CM | POA: Diagnosis not present

## 2017-11-24 DIAGNOSIS — I129 Hypertensive chronic kidney disease with stage 1 through stage 4 chronic kidney disease, or unspecified chronic kidney disease: Secondary | ICD-10-CM | POA: Diagnosis not present

## 2017-11-24 DIAGNOSIS — E039 Hypothyroidism, unspecified: Secondary | ICD-10-CM | POA: Diagnosis not present

## 2017-11-24 DIAGNOSIS — M1990 Unspecified osteoarthritis, unspecified site: Secondary | ICD-10-CM | POA: Diagnosis not present

## 2017-11-24 DIAGNOSIS — M810 Age-related osteoporosis without current pathological fracture: Secondary | ICD-10-CM | POA: Diagnosis not present

## 2018-03-17 DIAGNOSIS — Z9622 Myringotomy tube(s) status: Secondary | ICD-10-CM | POA: Diagnosis not present

## 2018-03-17 DIAGNOSIS — H6122 Impacted cerumen, left ear: Secondary | ICD-10-CM | POA: Diagnosis not present

## 2018-03-17 DIAGNOSIS — H9113 Presbycusis, bilateral: Secondary | ICD-10-CM | POA: Diagnosis not present

## 2018-03-31 DIAGNOSIS — H6522 Chronic serous otitis media, left ear: Secondary | ICD-10-CM | POA: Diagnosis not present

## 2018-03-31 DIAGNOSIS — H918X3 Other specified hearing loss, bilateral: Secondary | ICD-10-CM | POA: Diagnosis not present

## 2018-03-31 DIAGNOSIS — H6983 Other specified disorders of Eustachian tube, bilateral: Secondary | ICD-10-CM | POA: Diagnosis not present

## 2018-03-31 DIAGNOSIS — H90A12 Conductive hearing loss, unilateral, left ear with restricted hearing on the contralateral side: Secondary | ICD-10-CM | POA: Diagnosis not present

## 2018-04-04 DIAGNOSIS — H524 Presbyopia: Secondary | ICD-10-CM | POA: Diagnosis not present

## 2018-04-04 DIAGNOSIS — H52201 Unspecified astigmatism, right eye: Secondary | ICD-10-CM | POA: Diagnosis not present

## 2018-04-04 DIAGNOSIS — H5211 Myopia, right eye: Secondary | ICD-10-CM | POA: Diagnosis not present

## 2018-04-04 DIAGNOSIS — Z961 Presence of intraocular lens: Secondary | ICD-10-CM | POA: Diagnosis not present

## 2018-04-04 DIAGNOSIS — H538 Other visual disturbances: Secondary | ICD-10-CM | POA: Diagnosis not present

## 2018-04-15 ENCOUNTER — Other Ambulatory Visit: Payer: Self-pay | Admitting: Family Medicine

## 2018-04-15 DIAGNOSIS — Z0000 Encounter for general adult medical examination without abnormal findings: Secondary | ICD-10-CM | POA: Diagnosis not present

## 2018-04-15 DIAGNOSIS — R0989 Other specified symptoms and signs involving the circulatory and respiratory systems: Secondary | ICD-10-CM

## 2018-04-15 DIAGNOSIS — G720 Drug-induced myopathy: Secondary | ICD-10-CM | POA: Diagnosis not present

## 2018-04-15 DIAGNOSIS — M1990 Unspecified osteoarthritis, unspecified site: Secondary | ICD-10-CM | POA: Diagnosis not present

## 2018-04-15 DIAGNOSIS — E039 Hypothyroidism, unspecified: Secondary | ICD-10-CM | POA: Diagnosis not present

## 2018-04-15 DIAGNOSIS — E7800 Pure hypercholesterolemia, unspecified: Secondary | ICD-10-CM | POA: Diagnosis not present

## 2018-04-15 DIAGNOSIS — N184 Chronic kidney disease, stage 4 (severe): Secondary | ICD-10-CM | POA: Diagnosis not present

## 2018-04-15 DIAGNOSIS — M810 Age-related osteoporosis without current pathological fracture: Secondary | ICD-10-CM | POA: Diagnosis not present

## 2018-04-15 DIAGNOSIS — I129 Hypertensive chronic kidney disease with stage 1 through stage 4 chronic kidney disease, or unspecified chronic kidney disease: Secondary | ICD-10-CM | POA: Diagnosis not present

## 2018-04-19 ENCOUNTER — Ambulatory Visit
Admission: RE | Admit: 2018-04-19 | Discharge: 2018-04-19 | Disposition: A | Discharge status: Home / Self Care | Payer: Medicare Other | Source: Ambulatory Visit | Attending: Family Medicine | Admitting: Family Medicine

## 2018-04-19 DIAGNOSIS — I6523 Occlusion and stenosis of bilateral carotid arteries: Secondary | ICD-10-CM | POA: Diagnosis not present

## 2018-04-19 DIAGNOSIS — R0989 Other specified symptoms and signs involving the circulatory and respiratory systems: Secondary | ICD-10-CM

## 2018-05-13 DIAGNOSIS — M810 Age-related osteoporosis without current pathological fracture: Secondary | ICD-10-CM | POA: Diagnosis not present

## 2018-05-31 DIAGNOSIS — M8588 Other specified disorders of bone density and structure, other site: Secondary | ICD-10-CM | POA: Diagnosis not present

## 2018-05-31 DIAGNOSIS — M810 Age-related osteoporosis without current pathological fracture: Secondary | ICD-10-CM | POA: Diagnosis not present

## 2018-06-24 DIAGNOSIS — R69 Illness, unspecified: Secondary | ICD-10-CM | POA: Diagnosis not present

## 2018-06-24 DIAGNOSIS — R110 Nausea: Secondary | ICD-10-CM | POA: Diagnosis not present

## 2018-06-24 DIAGNOSIS — R05 Cough: Secondary | ICD-10-CM | POA: Diagnosis not present

## 2018-07-15 ENCOUNTER — Other Ambulatory Visit: Payer: Self-pay | Admitting: Family Medicine

## 2018-07-15 DIAGNOSIS — R69 Illness, unspecified: Secondary | ICD-10-CM | POA: Diagnosis not present

## 2018-07-15 DIAGNOSIS — R110 Nausea: Secondary | ICD-10-CM

## 2018-07-19 DIAGNOSIS — D631 Anemia in chronic kidney disease: Secondary | ICD-10-CM | POA: Diagnosis not present

## 2018-07-19 DIAGNOSIS — E039 Hypothyroidism, unspecified: Secondary | ICD-10-CM | POA: Diagnosis not present

## 2018-07-19 DIAGNOSIS — N183 Chronic kidney disease, stage 3 (moderate): Secondary | ICD-10-CM | POA: Diagnosis not present

## 2018-07-19 DIAGNOSIS — I129 Hypertensive chronic kidney disease with stage 1 through stage 4 chronic kidney disease, or unspecified chronic kidney disease: Secondary | ICD-10-CM | POA: Diagnosis not present

## 2018-07-19 DIAGNOSIS — N2581 Secondary hyperparathyroidism of renal origin: Secondary | ICD-10-CM | POA: Diagnosis not present

## 2018-07-19 DIAGNOSIS — E785 Hyperlipidemia, unspecified: Secondary | ICD-10-CM | POA: Diagnosis not present

## 2018-07-19 DIAGNOSIS — N189 Chronic kidney disease, unspecified: Secondary | ICD-10-CM | POA: Diagnosis not present

## 2018-07-19 DIAGNOSIS — N184 Chronic kidney disease, stage 4 (severe): Secondary | ICD-10-CM | POA: Diagnosis not present

## 2018-07-20 DIAGNOSIS — M810 Age-related osteoporosis without current pathological fracture: Secondary | ICD-10-CM | POA: Diagnosis not present

## 2018-07-20 DIAGNOSIS — N184 Chronic kidney disease, stage 4 (severe): Secondary | ICD-10-CM | POA: Diagnosis not present

## 2018-07-20 DIAGNOSIS — J309 Allergic rhinitis, unspecified: Secondary | ICD-10-CM | POA: Diagnosis not present

## 2018-07-20 DIAGNOSIS — G8929 Other chronic pain: Secondary | ICD-10-CM | POA: Diagnosis not present

## 2018-07-20 DIAGNOSIS — I129 Hypertensive chronic kidney disease with stage 1 through stage 4 chronic kidney disease, or unspecified chronic kidney disease: Secondary | ICD-10-CM | POA: Diagnosis not present

## 2018-07-20 DIAGNOSIS — E785 Hyperlipidemia, unspecified: Secondary | ICD-10-CM | POA: Diagnosis not present

## 2018-07-20 DIAGNOSIS — R69 Illness, unspecified: Secondary | ICD-10-CM | POA: Diagnosis not present

## 2018-07-20 DIAGNOSIS — K08409 Partial loss of teeth, unspecified cause, unspecified class: Secondary | ICD-10-CM | POA: Diagnosis not present

## 2018-07-20 DIAGNOSIS — E039 Hypothyroidism, unspecified: Secondary | ICD-10-CM | POA: Diagnosis not present

## 2018-07-20 DIAGNOSIS — M1990 Unspecified osteoarthritis, unspecified site: Secondary | ICD-10-CM | POA: Diagnosis not present

## 2018-07-22 ENCOUNTER — Ambulatory Visit
Admission: RE | Admit: 2018-07-22 | Discharge: 2018-07-22 | Disposition: A | Discharge status: Home / Self Care | Payer: Medicare HMO | Source: Ambulatory Visit | Attending: Family Medicine | Admitting: Family Medicine

## 2018-07-22 DIAGNOSIS — R110 Nausea: Secondary | ICD-10-CM | POA: Diagnosis not present

## 2018-07-28 DIAGNOSIS — D649 Anemia, unspecified: Secondary | ICD-10-CM | POA: Diagnosis not present

## 2018-07-28 DIAGNOSIS — R69 Illness, unspecified: Secondary | ICD-10-CM | POA: Diagnosis not present

## 2018-07-28 DIAGNOSIS — R110 Nausea: Secondary | ICD-10-CM | POA: Diagnosis not present

## 2018-08-11 DIAGNOSIS — N189 Chronic kidney disease, unspecified: Secondary | ICD-10-CM | POA: Diagnosis not present

## 2018-09-12 DIAGNOSIS — R69 Illness, unspecified: Secondary | ICD-10-CM | POA: Diagnosis not present

## 2018-10-03 ENCOUNTER — Other Ambulatory Visit: Payer: Self-pay | Admitting: Family Medicine

## 2018-10-03 DIAGNOSIS — Z1231 Encounter for screening mammogram for malignant neoplasm of breast: Secondary | ICD-10-CM

## 2018-10-18 DIAGNOSIS — M8580 Other specified disorders of bone density and structure, unspecified site: Secondary | ICD-10-CM | POA: Diagnosis not present

## 2018-10-18 DIAGNOSIS — E7800 Pure hypercholesterolemia, unspecified: Secondary | ICD-10-CM | POA: Diagnosis not present

## 2018-10-18 DIAGNOSIS — M1990 Unspecified osteoarthritis, unspecified site: Secondary | ICD-10-CM | POA: Diagnosis not present

## 2018-10-18 DIAGNOSIS — D649 Anemia, unspecified: Secondary | ICD-10-CM | POA: Diagnosis not present

## 2018-10-18 DIAGNOSIS — I129 Hypertensive chronic kidney disease with stage 1 through stage 4 chronic kidney disease, or unspecified chronic kidney disease: Secondary | ICD-10-CM | POA: Diagnosis not present

## 2018-10-18 DIAGNOSIS — N184 Chronic kidney disease, stage 4 (severe): Secondary | ICD-10-CM | POA: Diagnosis not present

## 2018-10-18 DIAGNOSIS — E039 Hypothyroidism, unspecified: Secondary | ICD-10-CM | POA: Diagnosis not present

## 2018-10-18 DIAGNOSIS — R0989 Other specified symptoms and signs involving the circulatory and respiratory systems: Secondary | ICD-10-CM | POA: Diagnosis not present

## 2018-10-18 DIAGNOSIS — G720 Drug-induced myopathy: Secondary | ICD-10-CM | POA: Diagnosis not present

## 2018-10-18 DIAGNOSIS — R69 Illness, unspecified: Secondary | ICD-10-CM | POA: Diagnosis not present

## 2018-11-09 ENCOUNTER — Other Ambulatory Visit (HOSPITAL_COMMUNITY): Payer: Self-pay | Admitting: *Deleted

## 2018-11-10 ENCOUNTER — Ambulatory Visit (HOSPITAL_COMMUNITY)
Admission: RE | Admit: 2018-11-10 | Discharge: 2018-11-10 | Disposition: A | Discharge status: Home / Self Care | Payer: Medicare HMO | Source: Ambulatory Visit | Attending: Nephrology | Admitting: Nephrology

## 2018-11-10 DIAGNOSIS — Z79899 Other long term (current) drug therapy: Secondary | ICD-10-CM | POA: Insufficient documentation

## 2018-11-10 DIAGNOSIS — N184 Chronic kidney disease, stage 4 (severe): Secondary | ICD-10-CM | POA: Diagnosis present

## 2018-11-10 DIAGNOSIS — D631 Anemia in chronic kidney disease: Secondary | ICD-10-CM | POA: Diagnosis not present

## 2018-11-10 DIAGNOSIS — Z5181 Encounter for therapeutic drug level monitoring: Secondary | ICD-10-CM | POA: Insufficient documentation

## 2018-11-10 LAB — POCT HEMOGLOBIN-HEMACUE: Hemoglobin: 7.8 g/dL — ABNORMAL LOW (ref 12.0–15.0)

## 2018-11-10 MED ORDER — EPOETIN ALFA-EPBX 10000 UNIT/ML IJ SOLN: 5000.0000 [IU] | Freq: Once | INTRAMUSCULAR | Status: DC

## 2018-11-10 MED ORDER — EPOETIN ALFA-EPBX 3000 UNIT/ML IJ SOLN
3000.0000 [IU] | Freq: Once | INTRAMUSCULAR | Status: AC
  Administered 2018-11-10: 3000 [IU] via SUBCUTANEOUS
  Filled 2018-11-10: qty 1

## 2018-11-10 MED ORDER — EPOETIN ALFA-EPBX 2000 UNIT/ML IJ SOLN
2000.0000 [IU] | Freq: Once | INTRAMUSCULAR | Status: AC
  Administered 2018-11-10: 2000 [IU] via SUBCUTANEOUS
  Filled 2018-11-10: qty 1

## 2018-11-10 NOTE — Discharge Instructions (Signed)
Epoetin Alfa injection °What is this medicine? °EPOETIN ALFA (e POE e tin AL fa) helps your body make more red blood cells. This medicine is used to treat anemia caused by chronic kidney failure, cancer chemotherapy, or HIV-therapy. It may also be used before surgery if you have anemia. °This medicine may be used for other purposes; ask your health care provider or pharmacist if you have questions. °COMMON BRAND NAME(S): Epogen, Procrit °What should I tell my health care provider before I take this medicine? °They need to know if you have any of these conditions: °-blood clotting disorders °-cancer patient not on chemotherapy °-cystic fibrosis °-heart disease, such as angina or heart failure °-hemoglobin level of 12 g/dL or greater °-high blood pressure °-low levels of folate, iron, or vitamin B12 °-seizures °-an unusual or allergic reaction to erythropoietin, albumin, benzyl alcohol, hamster proteins, other medicines, foods, dyes, or preservatives °-pregnant or trying to get pregnant °-breast-feeding °How should I use this medicine? °This medicine is for injection into a vein or under the skin. It is usually given by a health care professional in a hospital or clinic setting. °If you get this medicine at home, you will be taught how to prepare and give this medicine. Use exactly as directed. Take your medicine at regular intervals. Do not take your medicine more often than directed. °It is important that you put your used needles and syringes in a special sharps container. Do not put them in a trash can. If you do not have a sharps container, call your pharmacist or healthcare provider to get one. °A special MedGuide will be given to you by the pharmacist with each prescription and refill. Be sure to read this information carefully each time. °Talk to your pediatrician regarding the use of this medicine in children. While this drug may be prescribed for selected conditions, precautions do apply. °Overdosage: If you  think you have taken too much of this medicine contact a poison control center or emergency room at once. °NOTE: This medicine is only for you. Do not share this medicine with others. °What if I miss a dose? °If you miss a dose, take it as soon as you can. If it is almost time for your next dose, take only that dose. Do not take double or extra doses. °What may interact with this medicine? °Do not take this medicine with any of the following medications: °-darbepoetin alfa °This list may not describe all possible interactions. Give your health care provider a list of all the medicines, herbs, non-prescription drugs, or dietary supplements you use. Also tell them if you smoke, drink alcohol, or use illegal drugs. Some items may interact with your medicine. °What should I watch for while using this medicine? °Your condition will be monitored carefully while you are receiving this medicine. °You may need blood work done while you are taking this medicine. °What side effects may I notice from receiving this medicine? °Side effects that you should report to your doctor or health care professional as soon as possible: °-allergic reactions like skin rash, itching or hives, swelling of the face, lips, or tongue °-breathing problems °-changes in vision °-chest pain °-confusion, trouble speaking or understanding °-feeling faint or lightheaded, falls °-high blood pressure °-muscle aches or pains °-pain, swelling, warmth in the leg °-rapid weight gain °-severe headaches °-sudden numbness or weakness of the face, arm or leg °-trouble walking, dizziness, loss of balance or coordination °-seizures (convulsions) °-swelling of the ankles, feet, hands °-unusually weak or tired °  Side effects that usually do not require medical attention (report to your doctor or health care professional if they continue or are bothersome): °-diarrhea °-fever, chills (flu-like symptoms) °-headaches °-nausea, vomiting °-redness, stinging, or swelling at  site where injected °This list may not describe all possible side effects. Call your doctor for medical advice about side effects. You may report side effects to FDA at 1-800-FDA-1088. °Where should I keep my medicine? °Keep out of the reach of children. °Store in a refrigerator between 2 and 8 degrees C (36 and 46 degrees F). Do not freeze or shake. Throw away any unused portion if using a single-dose vial. Multi-dose vials can be kept in the refrigerator for up to 21 days after the initial dose. Throw away unused medicine. °NOTE: This sheet is a summary. It may not cover all possible information. If you have questions about this medicine, talk to your doctor, pharmacist, or health care provider. °© 2018 Elsevier/Gold Standard (2016-07-13 19:42:31) ° °

## 2018-11-14 DIAGNOSIS — M810 Age-related osteoporosis without current pathological fracture: Secondary | ICD-10-CM | POA: Diagnosis not present

## 2018-11-15 ENCOUNTER — Ambulatory Visit
Admission: RE | Admit: 2018-11-15 | Discharge: 2018-11-15 | Disposition: A | Discharge status: Home / Self Care | Payer: Medicare HMO | Source: Ambulatory Visit | Attending: Family Medicine | Admitting: Family Medicine

## 2018-11-15 DIAGNOSIS — Z1231 Encounter for screening mammogram for malignant neoplasm of breast: Secondary | ICD-10-CM | POA: Diagnosis not present

## 2018-11-17 ENCOUNTER — Ambulatory Visit (HOSPITAL_COMMUNITY)
Admission: RE | Admit: 2018-11-17 | Discharge: 2018-11-17 | Disposition: A | Discharge status: Home / Self Care | Payer: Medicare HMO | Source: Ambulatory Visit | Attending: Nephrology | Admitting: Nephrology

## 2018-11-17 DIAGNOSIS — N184 Chronic kidney disease, stage 4 (severe): Secondary | ICD-10-CM | POA: Insufficient documentation

## 2018-11-17 DIAGNOSIS — D631 Anemia in chronic kidney disease: Secondary | ICD-10-CM | POA: Insufficient documentation

## 2018-11-17 LAB — POCT HEMOGLOBIN-HEMACUE: Hemoglobin: 7.9 g/dL — ABNORMAL LOW (ref 12.0–15.0)

## 2018-11-17 MED ORDER — EPOETIN ALFA-EPBX 10000 UNIT/ML IJ SOLN: 5000.0000 [IU] | INTRAMUSCULAR | Status: DC

## 2018-11-17 MED ORDER — EPOETIN ALFA-EPBX 2000 UNIT/ML IJ SOLN
2000.0000 [IU] | Freq: Once | INTRAMUSCULAR | Status: AC
  Administered 2018-11-17: 2000 [IU] via SUBCUTANEOUS
  Filled 2018-11-17: qty 1

## 2018-11-17 MED ORDER — EPOETIN ALFA-EPBX 3000 UNIT/ML IJ SOLN
3000.0000 [IU] | Freq: Once | INTRAMUSCULAR | Status: AC
  Administered 2018-11-17: 3000 [IU] via SUBCUTANEOUS
  Filled 2018-11-17: qty 1

## 2018-11-24 ENCOUNTER — Ambulatory Visit (HOSPITAL_COMMUNITY)
Admission: RE | Admit: 2018-11-24 | Discharge: 2018-11-24 | Disposition: A | Discharge status: Home / Self Care | Payer: Medicare HMO | Source: Ambulatory Visit | Attending: Nephrology | Admitting: Nephrology

## 2018-11-24 DIAGNOSIS — N184 Chronic kidney disease, stage 4 (severe): Secondary | ICD-10-CM | POA: Insufficient documentation

## 2018-11-24 DIAGNOSIS — D631 Anemia in chronic kidney disease: Secondary | ICD-10-CM | POA: Diagnosis present

## 2018-11-24 DIAGNOSIS — B349 Viral infection, unspecified: Secondary | ICD-10-CM | POA: Diagnosis not present

## 2018-11-24 LAB — POCT HEMOGLOBIN-HEMACUE: Hemoglobin: 7.9 g/dL — ABNORMAL LOW (ref 12.0–15.0)

## 2018-11-24 MED ORDER — EPOETIN ALFA-EPBX 10000 UNIT/ML IJ SOLN: 5000.0000 [IU] | Freq: Once | INTRAMUSCULAR | Status: DC

## 2018-11-24 MED ORDER — EPOETIN ALFA-EPBX 3000 UNIT/ML IJ SOLN
3000.0000 [IU] | Freq: Once | INTRAMUSCULAR | Status: AC
  Administered 2018-11-24: 3000 [IU] via SUBCUTANEOUS
  Filled 2018-11-24: qty 1

## 2018-11-24 MED ORDER — EPOETIN ALFA-EPBX 2000 UNIT/ML IJ SOLN
2000.0000 [IU] | Freq: Once | INTRAMUSCULAR | Status: AC
  Administered 2018-11-24: 2000 [IU] via SUBCUTANEOUS
  Filled 2018-11-24: qty 1

## 2018-12-01 ENCOUNTER — Inpatient Hospital Stay (HOSPITAL_COMMUNITY)
Admission: EM | Admit: 2018-12-01 | Discharge: 2018-12-05 | DRG: 291 | Disposition: A | Discharge status: Home / Self Care | Payer: Medicare HMO | Attending: Internal Medicine | Admitting: Internal Medicine

## 2018-12-01 ENCOUNTER — Emergency Department (HOSPITAL_COMMUNITY): Payer: Medicare HMO

## 2018-12-01 ENCOUNTER — Other Ambulatory Visit: Payer: Self-pay

## 2018-12-01 ENCOUNTER — Other Ambulatory Visit (HOSPITAL_COMMUNITY): Payer: Medicare HMO

## 2018-12-01 ENCOUNTER — Encounter (HOSPITAL_COMMUNITY): Payer: Medicare HMO

## 2018-12-01 ENCOUNTER — Encounter (HOSPITAL_COMMUNITY): Payer: Self-pay

## 2018-12-01 DIAGNOSIS — E039 Hypothyroidism, unspecified: Secondary | ICD-10-CM | POA: Diagnosis not present

## 2018-12-01 DIAGNOSIS — I130 Hypertensive heart and chronic kidney disease with heart failure and stage 1 through stage 4 chronic kidney disease, or unspecified chronic kidney disease: Principal | ICD-10-CM | POA: Diagnosis present

## 2018-12-01 DIAGNOSIS — D631 Anemia in chronic kidney disease: Secondary | ICD-10-CM | POA: Diagnosis present

## 2018-12-01 DIAGNOSIS — Z79890 Hormone replacement therapy (postmenopausal): Secondary | ICD-10-CM

## 2018-12-01 DIAGNOSIS — F419 Anxiety disorder, unspecified: Secondary | ICD-10-CM | POA: Diagnosis not present

## 2018-12-01 DIAGNOSIS — D464 Refractory anemia, unspecified: Secondary | ICD-10-CM | POA: Diagnosis not present

## 2018-12-01 DIAGNOSIS — Z7982 Long term (current) use of aspirin: Secondary | ICD-10-CM

## 2018-12-01 DIAGNOSIS — N179 Acute kidney failure, unspecified: Secondary | ICD-10-CM | POA: Diagnosis present

## 2018-12-01 DIAGNOSIS — Z9049 Acquired absence of other specified parts of digestive tract: Secondary | ICD-10-CM

## 2018-12-01 DIAGNOSIS — Z841 Family history of disorders of kidney and ureter: Secondary | ICD-10-CM

## 2018-12-01 DIAGNOSIS — J189 Pneumonia, unspecified organism: Secondary | ICD-10-CM | POA: Diagnosis present

## 2018-12-01 DIAGNOSIS — R0602 Shortness of breath: Secondary | ICD-10-CM

## 2018-12-01 DIAGNOSIS — R0609 Other forms of dyspnea: Secondary | ICD-10-CM | POA: Diagnosis not present

## 2018-12-01 DIAGNOSIS — I340 Nonrheumatic mitral (valve) insufficiency: Secondary | ICD-10-CM | POA: Diagnosis not present

## 2018-12-01 DIAGNOSIS — E875 Hyperkalemia: Secondary | ICD-10-CM | POA: Diagnosis not present

## 2018-12-01 DIAGNOSIS — E872 Acidosis, unspecified: Secondary | ICD-10-CM | POA: Diagnosis present

## 2018-12-01 DIAGNOSIS — N184 Chronic kidney disease, stage 4 (severe): Secondary | ICD-10-CM | POA: Diagnosis present

## 2018-12-01 DIAGNOSIS — I700 Atherosclerosis of aorta: Secondary | ICD-10-CM | POA: Diagnosis present

## 2018-12-01 DIAGNOSIS — R05 Cough: Secondary | ICD-10-CM | POA: Diagnosis not present

## 2018-12-01 DIAGNOSIS — E034 Atrophy of thyroid (acquired): Secondary | ICD-10-CM | POA: Diagnosis not present

## 2018-12-01 DIAGNOSIS — Z91040 Latex allergy status: Secondary | ICD-10-CM

## 2018-12-01 DIAGNOSIS — R0600 Dyspnea, unspecified: Secondary | ICD-10-CM | POA: Diagnosis present

## 2018-12-01 DIAGNOSIS — N189 Chronic kidney disease, unspecified: Secondary | ICD-10-CM | POA: Diagnosis present

## 2018-12-01 DIAGNOSIS — I10 Essential (primary) hypertension: Secondary | ICD-10-CM | POA: Diagnosis present

## 2018-12-01 DIAGNOSIS — I5041 Acute combined systolic (congestive) and diastolic (congestive) heart failure: Secondary | ICD-10-CM | POA: Diagnosis present

## 2018-12-01 DIAGNOSIS — M1990 Unspecified osteoarthritis, unspecified site: Secondary | ICD-10-CM | POA: Diagnosis present

## 2018-12-01 DIAGNOSIS — Z9089 Acquired absence of other organs: Secondary | ICD-10-CM

## 2018-12-01 DIAGNOSIS — R69 Illness, unspecified: Secondary | ICD-10-CM | POA: Diagnosis not present

## 2018-12-01 DIAGNOSIS — Z79891 Long term (current) use of opiate analgesic: Secondary | ICD-10-CM

## 2018-12-01 DIAGNOSIS — I5021 Acute systolic (congestive) heart failure: Secondary | ICD-10-CM | POA: Diagnosis not present

## 2018-12-01 DIAGNOSIS — I272 Pulmonary hypertension, unspecified: Secondary | ICD-10-CM | POA: Diagnosis present

## 2018-12-01 DIAGNOSIS — Z880 Allergy status to penicillin: Secondary | ICD-10-CM

## 2018-12-01 DIAGNOSIS — I083 Combined rheumatic disorders of mitral, aortic and tricuspid valves: Secondary | ICD-10-CM | POA: Diagnosis present

## 2018-12-01 DIAGNOSIS — I447 Left bundle-branch block, unspecified: Secondary | ICD-10-CM | POA: Diagnosis present

## 2018-12-01 DIAGNOSIS — Z79899 Other long term (current) drug therapy: Secondary | ICD-10-CM

## 2018-12-01 HISTORY — DX: Headache: R51

## 2018-12-01 HISTORY — DX: Dyspnea, unspecified: R06.00

## 2018-12-01 HISTORY — DX: Anxiety disorder, unspecified: F41.9

## 2018-12-01 HISTORY — DX: Hypothyroidism, unspecified: E03.9

## 2018-12-01 HISTORY — DX: Unspecified osteoarthritis, unspecified site: M19.90

## 2018-12-01 HISTORY — DX: Essential (primary) hypertension: I10

## 2018-12-01 HISTORY — DX: Anemia, unspecified: D64.9

## 2018-12-01 HISTORY — DX: Chronic kidney disease, unspecified: N18.9

## 2018-12-01 HISTORY — DX: Headache, unspecified: R51.9

## 2018-12-01 LAB — CBC WITH DIFFERENTIAL/PLATELET
Abs Immature Granulocytes: 0.01 10*3/uL (ref 0.00–0.07)
Basophils Absolute: 0 10*3/uL (ref 0.0–0.1)
Basophils Relative: 1 %
Eosinophils Absolute: 0 10*3/uL (ref 0.0–0.5)
Eosinophils Relative: 1 %
HCT: 28.5 % — ABNORMAL LOW (ref 36.0–46.0)
Hemoglobin: 9.4 g/dL — ABNORMAL LOW (ref 12.0–15.0)
Immature Granulocytes: 0 %
Lymphocytes Relative: 10 %
Lymphs Abs: 0.5 10*3/uL — ABNORMAL LOW (ref 0.7–4.0)
MCH: 32.5 pg (ref 26.0–34.0)
MCHC: 33 g/dL (ref 30.0–36.0)
MCV: 98.6 fL (ref 80.0–100.0)
Monocytes Absolute: 0.3 10*3/uL (ref 0.1–1.0)
Monocytes Relative: 7 %
Neutro Abs: 3.6 10*3/uL (ref 1.7–7.7)
Neutrophils Relative %: 81 %
Platelets: 314 10*3/uL (ref 150–400)
RBC: 2.89 MIL/uL — ABNORMAL LOW (ref 3.87–5.11)
RDW: 15.4 % (ref 11.5–15.5)
WBC: 4.4 10*3/uL (ref 4.0–10.5)
nRBC: 0 % (ref 0.0–0.2)

## 2018-12-01 LAB — TROPONIN I: Troponin I: <0.03 ng/mL (ref <0.03)

## 2018-12-01 LAB — MRSA PCR SCREENING: MRSA by PCR: NEGATIVE

## 2018-12-01 LAB — IRON AND TIBC
Iron: 68 ug/dL (ref 28–170)
Saturation Ratios: 20 % (ref 10.4–31.8)
TIBC: 335 ug/dL (ref 250–450)
UIBC: 267 ug/dL

## 2018-12-01 LAB — COMPREHENSIVE METABOLIC PANEL
ALT: 19 U/L (ref 0–44)
AST: 24 U/L (ref 15–41)
Albumin: 3.4 g/dL — ABNORMAL LOW (ref 3.5–5.0)
Alkaline Phosphatase: 88 U/L (ref 38–126)
Anion gap: 13 (ref 5–15)
BUN: 34 mg/dL — ABNORMAL HIGH (ref 8–23)
CO2: 15 mmol/L — ABNORMAL LOW (ref 22–32)
Calcium: 8.7 mg/dL — ABNORMAL LOW (ref 8.9–10.3)
Chloride: 102 mmol/L (ref 98–111)
Creatinine, Ser: 2.25 mg/dL — ABNORMAL HIGH (ref 0.44–1.00)
GFR calc Af Amer: 22 mL/min — ABNORMAL LOW (ref >60)
GFR calc non Af Amer: 19 mL/min — ABNORMAL LOW (ref >60)
Glucose, Bld: 135 mg/dL — ABNORMAL HIGH (ref 70–99)
Potassium: 4.2 mmol/L (ref 3.5–5.1)
Sodium: 130 mmol/L — ABNORMAL LOW (ref 135–145)
Total Bilirubin: 1.4 mg/dL — ABNORMAL HIGH (ref 0.3–1.2)
Total Protein: 6.8 g/dL (ref 6.5–8.1)

## 2018-12-01 LAB — BRAIN NATRIURETIC PEPTIDE: B Natriuretic Peptide: 4053.3 pg/mL — ABNORMAL HIGH (ref 0.0–100.0)

## 2018-12-01 LAB — FERRITIN: Ferritin: 48 ng/mL (ref 11–307)

## 2018-12-01 LAB — INFLUENZA PANEL BY PCR (TYPE A & B)
Influenza A By PCR: NEGATIVE
Influenza B By PCR: NEGATIVE

## 2018-12-01 LAB — TSH: TSH: 2.769 u[IU]/mL (ref 0.350–4.500)

## 2018-12-01 MED ORDER — SENNOSIDES-DOCUSATE SODIUM 8.6-50 MG PO TABS
1.0000 | ORAL_TABLET | Freq: Every evening | ORAL | Status: DC | PRN
  Administered 2018-12-03: 1 via ORAL
  Filled 2018-12-01: qty 1

## 2018-12-01 MED ORDER — CITALOPRAM HYDROBROMIDE 20 MG PO TABS
10.0000 mg | ORAL_TABLET | Freq: Every day | ORAL | Status: DC
  Administered 2018-12-01 – 2018-12-05 (×5): 10 mg via ORAL
  Filled 2018-12-01 (×5): qty 1

## 2018-12-01 MED ORDER — FERROUS SULFATE 325 (65 FE) MG PO TABS
325.0000 mg | ORAL_TABLET | Freq: Every day | ORAL | Status: DC
  Administered 2018-12-02 – 2018-12-05 (×4): 325 mg via ORAL
  Filled 2018-12-01 (×4): qty 1

## 2018-12-01 MED ORDER — ONDANSETRON HCL 4 MG/2ML IJ SOLN
4.0000 mg | Freq: Four times a day (QID) | INTRAMUSCULAR | Status: DC | PRN
  Administered 2018-12-01: 4 mg via INTRAVENOUS
  Filled 2018-12-01: qty 2

## 2018-12-01 MED ORDER — TRAMADOL HCL 50 MG PO TABS
50.0000 mg | ORAL_TABLET | Freq: Four times a day (QID) | ORAL | Status: DC | PRN
  Administered 2018-12-04: 50 mg via ORAL
  Filled 2018-12-01: qty 1

## 2018-12-01 MED ORDER — MECLIZINE HCL 25 MG PO TABS: 25.0000 mg | ORAL_TABLET | Freq: Three times a day (TID) | ORAL | Status: DC | PRN

## 2018-12-01 MED ORDER — SODIUM CHLORIDE 0.9 % IV SOLN
500.0000 mg | INTRAVENOUS | Status: DC
  Administered 2018-12-02 – 2018-12-04 (×3): 500 mg via INTRAVENOUS
  Filled 2018-12-01 (×4): qty 500

## 2018-12-01 MED ORDER — SODIUM CHLORIDE 0.9 % IV SOLN
1.0000 g | INTRAVENOUS | Status: DC
  Administered 2018-12-02 – 2018-12-04 (×3): 1 g via INTRAVENOUS
  Filled 2018-12-01 (×4): qty 10

## 2018-12-01 MED ORDER — ASPIRIN 81 MG PO CHEW
81.0000 mg | CHEWABLE_TABLET | Freq: Every day | ORAL | Status: DC
  Administered 2018-12-01 – 2018-12-04 (×4): 81 mg via ORAL
  Filled 2018-12-01 (×4): qty 1

## 2018-12-01 MED ORDER — METOPROLOL SUCCINATE ER 25 MG PO TB24
25.0000 mg | ORAL_TABLET | Freq: Every day | ORAL | Status: DC
  Administered 2018-12-01 – 2018-12-04 (×4): 25 mg via ORAL
  Filled 2018-12-01 (×4): qty 1

## 2018-12-01 MED ORDER — LEVOFLOXACIN IN D5W 750 MG/150ML IV SOLN
750.0000 mg | Freq: Once | INTRAVENOUS | Status: AC
  Administered 2018-12-01: 750 mg via INTRAVENOUS
  Filled 2018-12-01: qty 150

## 2018-12-01 MED ORDER — HYDRALAZINE HCL 25 MG PO TABS
25.0000 mg | ORAL_TABLET | Freq: Three times a day (TID) | ORAL | Status: DC
  Administered 2018-12-01 – 2018-12-05 (×12): 25 mg via ORAL
  Filled 2018-12-01 (×13): qty 1

## 2018-12-01 MED ORDER — AMLODIPINE BESYLATE 5 MG PO TABS
5.0000 mg | ORAL_TABLET | Freq: Every day | ORAL | Status: DC
  Administered 2018-12-02 – 2018-12-05 (×4): 5 mg via ORAL
  Filled 2018-12-01 (×4): qty 1

## 2018-12-01 MED ORDER — LEVOTHYROXINE SODIUM 50 MCG PO TABS
50.0000 ug | ORAL_TABLET | Freq: Every day | ORAL | Status: DC
  Administered 2018-12-02 – 2018-12-05 (×4): 50 ug via ORAL
  Filled 2018-12-01 (×4): qty 1

## 2018-12-01 MED ORDER — ONDANSETRON HCL 4 MG/2ML IJ SOLN
4.0000 mg | Freq: Once | INTRAMUSCULAR | Status: AC
  Administered 2018-12-01: 4 mg via INTRAVENOUS
  Filled 2018-12-01: qty 2

## 2018-12-01 MED ORDER — ONDANSETRON HCL 4 MG PO TABS: 4.0000 mg | ORAL_TABLET | Freq: Four times a day (QID) | ORAL | Status: DC | PRN

## 2018-12-01 MED ORDER — LORAZEPAM 0.5 MG PO TABS: 0.5000 mg | ORAL_TABLET | Freq: Two times a day (BID) | ORAL | Status: DC | PRN

## 2018-12-01 MED ORDER — FUROSEMIDE 10 MG/ML IJ SOLN
40.0000 mg | Freq: Every day | INTRAMUSCULAR | Status: DC
  Administered 2018-12-02: 40 mg via INTRAVENOUS
  Filled 2018-12-01: qty 4

## 2018-12-01 MED ORDER — AMLODIPINE BENZOATE 1 MG/ML PO SUSP: 5.0000 mg | Freq: Every day | ORAL | Status: DC

## 2018-12-01 MED ORDER — HEPARIN SODIUM (PORCINE) 5000 UNIT/ML IJ SOLN
5000.0000 [IU] | Freq: Three times a day (TID) | INTRAMUSCULAR | Status: DC
  Administered 2018-12-01 – 2018-12-05 (×12): 5000 [IU] via SUBCUTANEOUS
  Filled 2018-12-01 (×12): qty 1

## 2018-12-01 MED ORDER — GEMFIBROZIL 600 MG PO TABS
600.0000 mg | ORAL_TABLET | Freq: Two times a day (BID) | ORAL | Status: DC
  Administered 2018-12-01 – 2018-12-05 (×8): 600 mg via ORAL
  Filled 2018-12-01 (×10): qty 1

## 2018-12-01 MED ORDER — FUROSEMIDE 10 MG/ML IJ SOLN
40.0000 mg | Freq: Once | INTRAMUSCULAR | Status: AC
  Administered 2018-12-01: 40 mg via INTRAVENOUS
  Filled 2018-12-01: qty 4

## 2018-12-01 NOTE — H&P (Signed)
[facility] History and Physical  Alicia Sellers [id number] DOB: November 11, 1929 DOA: 12/01/2018  Referring physician: Dr. Laverta Baltimore PCP: Gaynelle Arabian, MD   Chief Complaint: worsening shortness of breath  HPI: Alicia Sellers is a 84 y.o. female with medical history significant for CKD, Stage 4, HTN, anxiety, hypothyroidism who presents on 12/01/2018 with worsening dyspnea on exertion that has progressed to dyspnea at rest.      Dry cough x 3 weeks. Worsening shortness of breath. Three weeks ago could walk from admissionsarea to treatment area ( for prokit) but now to do the same distance she needs a wheel chair.  Last two days got worse with worsening cough and now dyspnea with minimal exertion ( putting on her shoes or even sitting in chair) denies any sick contacts, recent illness.  Denies any PND, orthopnea or edema.      CKD. Dr. Edrick Oh is her nephrologist ([facility]). Last saw about 4 months ago. At that time she reports she was moved from stage 3 to stage 4. No problems with urination, no hematuria.   HTN. Takes amlodipine and hydralazine adherently.  It typically runs 150s/50-60s.       ED Course:  In the ED she was afebrile, hemodynamically stable with blood pressure range 136/60-154/61.  CMP was notable for creatinine of 2.25, GFR 19, BUN 34, CO2 15.  Hemoglobin 9.4.  BNP 4053.3.  Troponin less than 0.03. Chest x-ray showed left lower lobe opacity consistent with pneumonia and small left pleural effusion Cardiomegaly with mild pulmonary vascular congestion.  Patient was given IV Levaquin IV Lasix 40 mg x 1. [facility] was called for further management   Review of Systems:  Constitutional:  No weight loss, night sweats, Fevers, chills, fatigue.  HEENT:  No headaches, Difficulty swallowing,Tooth/dental problems,Sore throat,  No sneezing, itching, ear ache, nasal congestion, post nasal drip,  Cardio-vascular:  No chest pain, Orthopnea, PND, swelling in  lower extremities, anasarca, dizziness, palpitations  GI:  No heartburn, indigestion, abdominal pain, nausea, vomiting, diarrhea, change in bowel habits, loss of appetite  Resp:  shortness of breath with exertion and at rest. No excess mucus, no productive cough, No non-productive cough, No coughing up of blood.No change in color of mucus.No wheezing.No chest wall deformity  Skin:  no rash or lesions.  GU:  no dysuria, change in color of urine, no urgency or frequency. No flank pain.  Musculoskeletal:  No joint pain or swelling. No decreased range of motion. No back pain.  Psych:  No change in mood or affect. No depression or anxiety. No memory loss.   No past medical history on file. Past Surgical History:  Procedure Laterality Date  . BREAST BIOPSY Left 2015   benign   Social History:  has no history on file for tobacco, alcohol, and drug.  Allergies  Allergen Reactions  . Latex Rash  . Penicillins Rash    DID THE REACTION INVOLVE: Swelling of the face/tongue/throat, SOB, or low BP? No Sudden or severe rash/hives, skin peeling, or the inside of the mouth or nose? No Did it require medical treatment? No When did it last happen? If all above answers are "NO", may proceed with cephalosporin use.    Family History  Problem Relation Age of Onset  . Breast cancer Neg Hx       Prior to Admission medications   Medication Sig Start Date End Date Taking? Authorizing Provider  AMLODIPINE BENZOATE PO Take 5 mg by mouth daily.   Yes  [provider]  aspirin 81 MG chewable tablet Chew 81 mg by mouth daily.   Yes [provider]  Calcium Carbonate-Vitamin D (CALCIUM 500 + D) 500-125 MG-UNIT TABS Take 1 tablet by mouth 2 (two) times daily. Taking 600-400   Yes [provider]  citalopram (CELEXA) 10 MG tablet Take 10 mg by mouth daily.   Yes [provider]  denosumab (PROLIA) 60 MG/ML SOSY injection Inject 60 mg into the skin every 6 (six)  months.   Yes [provider]  epoetin alfa-epbx (RETACRIT) 22979 UNIT/ML injection Inject 10,000 Units into the skin once a week.   Yes [provider]  fexofenadine (ALLEGRA) 180 MG tablet Take 180 mg by mouth daily.   Yes [provider]  gemfibrozil (LOPID) 600 MG tablet Take 600 mg by mouth 2 (two) times daily before a meal.   Yes [provider]  hydrALAZINE (APRESOLINE) 25 MG tablet Take 25 mg by mouth 3 (three) times daily.   Yes [provider]  Iron Combinations (IRON COMPLEX) CAPS Take 1 capsule by mouth daily.   Yes [provider]  levothyroxine (SYNTHROID, LEVOTHROID) 50 MCG tablet Take 50 mcg by mouth daily before breakfast.   Yes [provider]  LORazepam (ATIVAN) 0.5 MG tablet Take 0.5 mg by mouth 2 (two) times daily.   Yes [provider]  meclizine (ANTIVERT) 25 MG tablet Take 25 mg by mouth 3 (three) times daily as needed for dizziness.   Yes [provider]  metaxalone (SKELAXIN) 800 MG tablet Take 800 mg by mouth 2 (two) times daily.   Yes [provider]  metoprolol succinate (TOPROL-XL) 25 MG 24 hr tablet Take 25 mg by mouth daily.   Yes [provider]  Multiple Vitamins-Minerals (MULTI COMPLETE) CAPS Take 1 tablet by mouth daily.   Yes [provider]  Omega-3 Fatty Acids (FISH OIL) 1000 MG CAPS Take 1,000 capsules by mouth 2 (two) times daily with a meal.   Yes [provider]  Red Yeast Rice 600 MG TABS Take 600 tablets by mouth 2 (two) times daily.   Yes [provider]  traMADol (ULTRAM) 50 MG tablet Take by mouth every 6 (six) hours as needed.   Yes [provider]  vitamin E 400 UNIT capsule Take 400 Units by mouth daily.   Yes [provider]  Ketoprofen POWD Ketoprofen 20% gel aaa tid-qid Patient not taking: Reported on 12/01/2018 01/06/12   Claris Gower, MD   Physical Exam: Vitals:   12/01/18 0946 12/01/18 1000  12/01/18 1208  BP: (!) 159/60 (!) 159/64 (!) 154/61  Pulse: 71 72 65  Resp: 20 18 17   Temp: (!) 97.3 F (36.3 C)    TempSrc: Oral    SpO2: 99% 98% 95%    Wt Readings from Last 3 Encounters:  01/06/12 73.9 kg    Constitutional normal appearing elderly female, no distress Eyes: EOMI, anicteric, normal conjunctivae ENMT: Oropharynx with moist mucous membranes, Neck: FROM,  Cardiovascular: RRR no MRGs, with no peripheral edema Respiratory: Normal respiratory effort on room air, diffuse crackles up to mid lung bilaterally Abdomen: Soft,non-tender, normal bowel sounds Musculoskeletal: no clubbing / cyanosis. No joint deformity upper and lower extremities. Good ROM, no contractures. Normal muscle tone. Skin: No rash ulcers, or lesions. Without skin tenting  Neurologic: Grossly no focal neuro deficit. Psychiatric:Appropriate affect, and mood. Mental status AAOx3          Labs on Admission:  Basic Metabolic Panel: Recent Labs  Lab 12/01/18 1014  NA 130*  K 4.2  CL 102  CO2 15*  GLUCOSE 135*  BUN 34*  CREATININE 2.25*  CALCIUM 8.7*   Liver Function Tests: Recent Labs  Lab 12/01/18 1014  AST 24  ALT 19  ALKPHOS 88  BILITOT 1.4*  PROT 6.8  ALBUMIN 3.4*   No results for input(s): LIPASE, AMYLASE in the last 168 hours. No results for input(s): AMMONIA in the last 168 hours. CBC: Recent Labs  Lab 12/01/18 1014  WBC 4.4  NEUTROABS 3.6  HGB 9.4*  HCT 28.5*  MCV 98.6  PLT 314   Cardiac Enzymes: Recent Labs  Lab 12/01/18 1014  TROPONINI <0.03    BNP (last 3 results) Recent Labs    12/01/18 1014  BNP 4,053.3*    ProBNP (last 3 results) No results for input(s): PROBNP in the last 8760 hours.  CBG: No results for input(s): GLUCAP in the last 168 hours.  Radiological Exams on Admission: Dg Chest 2 View  Result Date: 12/01/2018 CLINICAL DATA:  Short of breath, cough for 2 days EXAM: CHEST - 2 VIEW COMPARISON:  Chest x-ray 03/24/2007 FINDINGS: There is  opacity at the left lung base posteriorly most consistent with patchy left lower lobe pneumonia and small left pleural effusion. A small right pleural effusion and be excluded. There is cardiomegaly present and there may be superimposed mild pulmonary vascular congestion. Curvature of the thoracic spine is unchanged. IMPRESSION: 1. Left lower lobe opacity most consistent with pneumonia and small left pleural effusion. 2. Cardiomegaly. Can not exclude very mild pulmonary vascular congestion. Electronically Signed   By: Ivar Drape M.D.   On: 12/01/2018 11:00    EKG: Independently reviewed.  normal sinus rhythm, unchanged from previous tracings, normal sinus rhythm, RBBB.  Assessment/Plan Active Problems:   Dyspnea   HTN (hypertension)   CKD (chronic kidney disease), stage IV (HCC)   Hypothyroidism   Anxiety   1. Dyspnea with exertion and at rest.  Progressive dyspnea over several weeks with chest x-ray showing cardiomegaly, mild pulmonary vascular congestion, elevated BNP most concerning for CHF, plan to check TTE, strict intake and output, continue IV Lasix.  It also mentions left lower lobe opacity, I am less concerned for infection given patient has remained afebrile, with no white count, no fevers or chills at home, no recent sick contacts and symptoms seem more consistent with a potential CHF exacerbation; however, given elderly age and opacity mentioned on CXR agree with empiric Levaquin (switched to IV ceftriaxone and azithromycin on 12/27), monitor blood cultures.  Another consideration is her CKD stage IV aside from vascular congestion on chest x-ray she has no signs of hypervolemia on exam.  Will need to obtain recent blood work from her outpatient nephrologist ([place]) for comparison.  2. Hypertension, not quite at goal.  SBP's in the 150s on arrival.  Continue home amlodipine, Toprol, hydralazine 3 times daily.  Monitor on telemetry.  IV hydralazine PRN.   3. CKD stage IV with non anion gap  metabolic acidosis.  Unclear baseline.  Followed by Dr. Edrick Oh as outpatient, will need to obtain recent blood work for comparison.  Creatinine on admission 2.25.  Avoid nephrotoxins, monitor BMP  4. Anemia of chronic disease, most likely due to CKD.  Patient also takes oral iron this can also be contributing factor.  Will check iron panel, closely monitor CBC.  5. Hypothyroidism, stable.  Continue Synthroid, check TSH  6. Anxiety.  Continue PRN lorazepam.   Consultants: None Code Status: Full code, discussed on day of admission DVT Prophylaxis: Heparin subcutaneous Family Communication: Family updated at bedside Disposition Plan: Admitted as observation, patient currently not requiring any oxygen and brought in for observation.  Due to significant dyspnea presume related to potential CHF flare which will need further evaluation.  Has been given IV Lasix but may be able to transition to oral the next 24 hours.    Desiree Hane MD [facility]  Pager [phone]  If 7PM-7AM, please contact night-coverage [email or web address] Password [email or web address]  12/01/2018, 1:00 PM

## 2018-12-01 NOTE — ED Notes (Signed)
Patient transported to X-ray 

## 2018-12-01 NOTE — Progress Notes (Signed)
Patient-reported Beta-Lactam Allergy Assessment  Specific drug that caused reaction: Penicillin Reaction(s) that occurred: Rash, self-limiting   Antibiotics tolerated in the past: No cephalosporins or other beta-lactams documented in past  Due to reported unknown reaction, the following questions were asked about the Penicillin allergy:  How long after taking the drug did the reaction occur?  Unknown/ cannot recall  How long ago did the reaction occur?  Over 10 years ago  What was done to manage the reaction?  No management was needed, mild rash per Pt report  Based on the above interview, it has been deemed appropriate for patient to be administered Ceftriaxone as the risk for cross-reactivity to documented reaction is low.  Bertis Ruddy, PharmD Clinical Pharmacist Please check AMION for all [place] numbers 12/01/2018 12:49 PM

## 2018-12-01 NOTE — ED Triage Notes (Signed)
Pt reports increased shortness of breath over the last two days. Pt does have a dry cough and states her shortness of breath is worth with exertions. Pt also reports nausea with no vomiting.

## 2018-12-01 NOTE — ED Provider Notes (Signed)
Emergency Department Provider Note   I have reviewed the triage vital signs and the nursing notes.   HISTORY  Chief Complaint Shortness of Breath   HPI Alicia Sellers is a 84 y.o. female with PMH of CKD, anemia, and OA presents to the emergency department for evaluation of progressively worsening fatigue, shortness of breath.  Times have been progressively worsening over the past 2 to 3 days.  Patient has followed with her primary care physician with no clear diagnosis at that time.  She is followed by nephrology, Dr. Justin Mend, for anemia and chronic kidney disease.  Patient has been taking iron supplementation and following with hemoglobin checks frequently. No blood transfusions. Patient denies significant lower extremity swelling.  She denies chest pain, congestion, body aches, fevers.  No productive cough.  Symptoms are significantly worse with exertion.  She does not smoke tobacco or live with a smoker.    No past medical history on file.  Patient Active Problem List   Diagnosis Date Noted  . Dyspnea 12/01/2018  . HTN (hypertension) 12/01/2018  . CKD (chronic kidney disease), stage IV ([place]) 12/01/2018  . Hypothyroidism 12/01/2018  . Anxiety 12/01/2018  . Arthritis 01/06/2012    Past Surgical History:  Procedure Laterality Date  . BREAST BIOPSY Left 2015   benign   Allergies Latex and Penicillins  Family History  Problem Relation Age of Onset  . Breast cancer Neg Hx     Social History Social History   Tobacco Use  . Smoking status: Not on file  Substance Use Topics  . Alcohol use: Not on file  . Drug use: Not on file    Review of Systems  Constitutional: No fever/chills. Positive fatigue.  Eyes: No visual changes. ENT: No sore throat. Cardiovascular: Denies chest pain. Respiratory: Positive shortness of breath. Gastrointestinal: No abdominal pain.  No nausea, no vomiting.  No diarrhea.  No constipation. Genitourinary: Negative for  dysuria. Musculoskeletal: Negative for back pain. Skin: Negative for rash. Neurological: Negative for headaches, focal weakness or numbness.  10-point ROS otherwise negative.  ____________________________________________   PHYSICAL EXAM:  VITAL SIGNS: ED Triage Vitals [12/01/18 0946]  Enc Vitals Group     BP (!) 159/60     Pulse Rate 71     Resp 20     Temp (!) 97.3 F (36.3 C)     Temp Source Oral     SpO2 99 %   Constitutional: Alert and oriented. Well appearing and in no acute distress. Eyes: Conjunctivae are normal.  Head: Atraumatic. Nose: No congestion/rhinnorhea. Mouth/Throat: Mucous membranes are moist.  Oropharynx non-erythematous. Neck: No stridor. Cardiovascular: Normal rate, regular rhythm. Good peripheral circulation. Grossly normal heart sounds.   Respiratory: Normal respiratory effort.  No retractions. Lungs CTAB. Gastrointestinal: Soft and nontender. No distention.  Musculoskeletal: No lower extremity tenderness with trace edema. No gross deformities of extremities. Neurologic:  Normal speech and language. No gross focal neurologic deficits are appreciated.  Skin:  Skin is warm, dry and intact. No rash noted.  ____________________________________________   LABS (all labs ordered are listed, but only abnormal results are displayed)  Labs Reviewed  COMPREHENSIVE METABOLIC PANEL - Abnormal; Notable for the following components:      Result Value   Sodium 130 (*)    CO2 15 (*)    Glucose, Bld 135 (*)    BUN 34 (*)    Creatinine, Ser 2.25 (*)    Calcium 8.7 (*)    Albumin 3.4 (*)  Total Bilirubin 1.4 (*)    GFR calc non Af Amer 19 (*)    GFR calc Af Amer 22 (*)    All other components within normal limits  CBC WITH DIFFERENTIAL/PLATELET - Abnormal; Notable for the following components:   RBC 2.89 (*)    Hemoglobin 9.4 (*)    HCT 28.5 (*)    Lymphs Abs 0.5 (*)    All other components within normal limits  BRAIN NATRIURETIC PEPTIDE - Abnormal;  Notable for the following components:   B Natriuretic Peptide 4,053.3 (*)    All other components within normal limits  CULTURE, BLOOD (ROUTINE X 2)  CULTURE, BLOOD (ROUTINE X 2)  TROPONIN I  INFLUENZA PANEL BY PCR (TYPE A & B)  POC OCCULT BLOOD, ED   ____________________________________________  EKG   EKG Interpretation  Date/Time:  Thursday December 01 2018 09:46:11 EST Ventricular Rate:  71 PR Interval:    QRS Duration: 149 QT Interval:  462 QTC Calculation: 503 R Axis:   -45 Text Interpretation:  Sinus rhythm Prolonged PR interval RBBB and LAFB No STEMI.  No recent EKG for comparison.  Confirmed by Nanda Quinton [phone]) on 12/01/2018 10:13:10 AM       ____________________________________________  RADIOLOGY  Dg Chest 2 View  Result Date: 12/01/2018 CLINICAL DATA:  Short of breath, cough for 2 days EXAM: CHEST - 2 VIEW COMPARISON:  Chest x-ray 03/24/2007 FINDINGS: There is opacity at the left lung base posteriorly most consistent with patchy left lower lobe pneumonia and small left pleural effusion. A small right pleural effusion and be excluded. There is cardiomegaly present and there may be superimposed mild pulmonary vascular congestion. Curvature of the thoracic spine is unchanged. IMPRESSION: 1. Left lower lobe opacity most consistent with pneumonia and small left pleural effusion. 2. Cardiomegaly. Can not exclude very mild pulmonary vascular congestion. Electronically Signed   By: Ivar Drape M.D.   On: 12/01/2018 11:00    ____________________________________________   PROCEDURES  Procedure(s) performed:   Procedures  None ____________________________________________   INITIAL IMPRESSION / ASSESSMENT AND PLAN / ED COURSE  Pertinent labs & imaging results that were available during my care of the patient were reviewed by me and considered in my medical decision making (see chart for details).  Patient presents to the emergency department for evaluation of  primarily exertional dyspnea with generalized weakness.  No infection or flulike symptoms.  Patient does have anemia and is followed by nephrology for associated CKD.  She is receiving an unknown injection to increase her hemoglobin levels.  She has had a discussion with nephrology that she is potential to require dialysis.   Labs reviewed with BNP severely elevated. Unknown CKD baseline but Hb is up-trending. Defer rectal exam for now. Patient CXR shows infiltrate. Question PNA vs asymmetric edema. Plan for Lasix and abx coverage. Sending flu swab for testing as well. No hypoxemia at rest but significant SOB with exertion here in the ED.   Discussed patient's case with Hospitalist to request admission. Patient and family (if present) updated with plan. Care transferred to Hospitalist service.  I reviewed all nursing notes, vitals, pertinent old records, EKGs, labs, imaging (as available).  ____________________________________________  FINAL CLINICAL IMPRESSION(S) / ED DIAGNOSES  Final diagnoses:  Community acquired pneumonia, unspecified laterality  SOB (shortness of breath)    MEDICATIONS GIVEN DURING THIS VISIT:  Medications  levofloxacin (LEVAQUIN) IVPB 750 mg (750 mg Intravenous New Bag/Given 12/01/18 1217)  azithromycin (ZITHROMAX) 500 mg in sodium chloride 0.9 %  250 mL IVPB (has no administration in time range)  cefTRIAXone (ROCEPHIN) 1 g in sodium chloride 0.9 % 100 mL IVPB (has no administration in time range)  ondansetron (ZOFRAN) injection 4 mg (4 mg Intravenous Given 12/01/18 1016)  furosemide (LASIX) injection 40 mg (40 mg Intravenous Given 12/01/18 1212)    Note:  This document was prepared using Dragon voice recognition software and may include unintentional dictation errors.  Nanda Quinton, MD Emergency Medicine    Long, [facility], MD 12/01/18 [phone]

## 2018-12-02 ENCOUNTER — Encounter (HOSPITAL_COMMUNITY): Payer: Self-pay

## 2018-12-02 ENCOUNTER — Observation Stay (HOSPITAL_BASED_OUTPATIENT_CLINIC_OR_DEPARTMENT_OTHER): Payer: Medicare HMO

## 2018-12-02 DIAGNOSIS — E872 Acidosis, unspecified: Secondary | ICD-10-CM | POA: Diagnosis present

## 2018-12-02 DIAGNOSIS — R0609 Other forms of dyspnea: Secondary | ICD-10-CM

## 2018-12-02 DIAGNOSIS — I340 Nonrheumatic mitral (valve) insufficiency: Secondary | ICD-10-CM

## 2018-12-02 DIAGNOSIS — F419 Anxiety disorder, unspecified: Secondary | ICD-10-CM

## 2018-12-02 LAB — BASIC METABOLIC PANEL
Anion gap: 10 (ref 5–15)
BUN: 34 mg/dL — ABNORMAL HIGH (ref 8–23)
CO2: 19 mmol/L — ABNORMAL LOW (ref 22–32)
Calcium: 8.5 mg/dL — ABNORMAL LOW (ref 8.9–10.3)
Chloride: 101 mmol/L (ref 98–111)
Creatinine, Ser: 2.44 mg/dL — ABNORMAL HIGH (ref 0.44–1.00)
GFR calc Af Amer: 20 mL/min — ABNORMAL LOW (ref >60)
GFR calc non Af Amer: 17 mL/min — ABNORMAL LOW (ref >60)
Glucose, Bld: 88 mg/dL (ref 70–99)
Potassium: 5.4 mmol/L — ABNORMAL HIGH (ref 3.5–5.1)
Sodium: 130 mmol/L — ABNORMAL LOW (ref 135–145)

## 2018-12-02 LAB — ECHOCARDIOGRAM COMPLETE
Height: 61 in
Weight: 2057.6 oz

## 2018-12-02 LAB — CBC
HCT: 22.9 % — ABNORMAL LOW (ref 36.0–46.0)
Hemoglobin: 7.7 g/dL — ABNORMAL LOW (ref 12.0–15.0)
MCH: 32.8 pg (ref 26.0–34.0)
MCHC: 33.6 g/dL (ref 30.0–36.0)
MCV: 97.4 fL (ref 80.0–100.0)
Platelets: 283 10*3/uL (ref 150–400)
RBC: 2.35 MIL/uL — ABNORMAL LOW (ref 3.87–5.11)
RDW: 15 % (ref 11.5–15.5)
WBC: 3.3 10*3/uL — ABNORMAL LOW (ref 4.0–10.5)
nRBC: 0 % (ref 0.0–0.2)

## 2018-12-02 LAB — GLUCOSE, CAPILLARY: Glucose-Capillary: 84 mg/dL (ref 70–99)

## 2018-12-02 MED ORDER — SODIUM POLYSTYRENE SULFONATE 15 GM/60ML PO SUSP
15.0000 g | Freq: Once | ORAL | Status: AC
  Administered 2018-12-02: 15 g via ORAL
  Filled 2018-12-02: qty 60

## 2018-12-02 NOTE — Plan of Care (Signed)

## 2018-12-02 NOTE — Progress Notes (Signed)
Progress Notes       PROGRESS NOTE  Alicia Sellers EXN:[id number] DOB: 1929/01/03 DOA: 12/01/2018 PCP: Gaynelle Arabian, MD   LOS: 0 days   Brief Narrative / Interim history: 84 year old female with chronic kidney disease stage IV, hypertension, anxiety, hypothyroidism who was admitted on 12/26 with worsening dyspnea on exertion.   Subjective: Feeling a little bit better this morning, less short of breath, was able to ambulate to the bathroom and while still dyspneic felt a better than at home but still far off from her baseline.  No chest pain  Assessment & Plan: Active Problems:   Dyspnea   HTN (hypertension)   CKD (chronic kidney disease), stage IV ([facility])   Hypothyroidism   Anxiety   Anemia due to chronic kidney disease   Metabolic acidosis, normal anion gap (NAG)   Principal Problem Dyspnea on exertion and at rest, multifactorial concern for CHF versus pneumonia -Chest x-ray on admission with fluid overload with cardiomegaly, slight pulmonary vascular congestion elevated BNP -2D echo pending -Has received IV Lasix yesterday, she does not look extremely fluid overloaded this morning, given slight bump in creatinine I will hold further diuresis -Chest x-ray was also concerning for pneumonia, she did have a cough but no fever or chills.  Started on ceftriaxone and azithromycin, continue -Remains quite dyspneic continue to require ongoing inpatient care  Additional Problems Hypertension -Continue Norvasc, hydralazine, metoprolol  Hypothyroidism -Continue levothyroxine  Chronic kidney disease stage IV -I am not clear about baseline, creatinine on admission 2.25, worsening today to 2.44 after receiving Lasix, hold further Lasix for this afternoon and repeat renal function in the morning, if it continues to get worse may need nephrology input  Anemia of chronic disease -Likely in the setting of chronic kidney disease, continue oral iron, downtrending this morning we will  recheck tomorrow morning -No bleeding  Anxiety -Continue PRN lorazepam  Scheduled Meds: . amLODipine  5 mg Oral Daily  . aspirin  81 mg Oral Q2000  . citalopram  10 mg Oral Daily  . ferrous sulfate  325 mg Oral Daily  . gemfibrozil  600 mg Oral BID AC  . heparin  5,000 Units Subcutaneous Q8H  . hydrALAZINE  25 mg Oral TID  . levothyroxine  50 mcg Oral Q0600  . metoprolol succinate  25 mg Oral Q2000   Continuous Infusions: . azithromycin    . cefTRIAXone (ROCEPHIN)  IV     PRN Meds:.LORazepam, meclizine, ondansetron **OR** ondansetron (ZOFRAN) IV, senna-docusate, traMADol  DVT prophylaxis: heparin Code Status: Full code Family Communication: no family at bedside  Disposition Plan: home when ready   Consultants:   None   Procedures:   2D echo: pending  Antimicrobials:  Ceftriaxone / Azithromycin 12/16 >>   Objective: Vitals:   12/01/18 1547 12/01/18 2325 12/02/18 0538 12/02/18 0750  BP: (!) 156/56 (!) 148/62  (!) 169/62  Pulse: 63 65  61  Resp: 12 15  16   Temp: 98 F (36.7 C) 98.4 F (36.9 C)  97.7 F (36.5 C)  TempSrc: Oral Oral  Oral  SpO2: 95% 94%  97%  Weight:   58.3 kg   Height:        Intake/Output Summary (Last 24 hours) at 12/02/2018 1314 Last data filed at 12/02/2018 1143 Gross per 24 hour  Intake 150 ml  Output 1950 ml  Net -1800 ml   Filed Weights   12/01/18 1400 12/02/18 0538  Weight: 73.9 kg 58.3 kg    Examination:  Constitutional:  NAD Eyes:  lids and conjunctivae normal ENMT: Mucous membranes are moist. Neck: normal, supple Respiratory: clear to auscultation bilaterally, no wheezing, diminished sounds at the bases.  Normal respiratory effort at rest Cardiovascular: Regular rate and rhythm, no murmurs / rubs / gallops. No LE edema.  Abdomen: no tenderness. Bowel sounds positive.  Musculoskeletal: no clubbing / cyanosis. Skin: no rashes seen Neurologic: CN 2-12 grossly intact. Strength 5/5 in all 4.  Psychiatric: Normal  judgment and insight. Alert and oriented x 3. Normal mood.    Data Reviewed: I have independently reviewed following labs and imaging studies   CBC: Recent Labs  Lab 12/01/18 1014 12/02/18 0225  WBC 4.4 3.3*  NEUTROABS 3.6  --   HGB 9.4* 7.7*  HCT 28.5* 22.9*  MCV 98.6 97.4  PLT 314 161   Basic Metabolic Panel: Recent Labs  Lab 12/01/18 1014 12/02/18 0225  NA 130* 130*  K 4.2 5.4*  CL 102 101  CO2 15* 19*  GLUCOSE 135* 88  BUN 34* 34*  CREATININE 2.25* 2.44*  CALCIUM 8.7* 8.5*   GFR: Estimated Creatinine Clearance: 13.3 mL/min (A) (by C-G formula based on SCr of 2.44 mg/dL (H)). Liver Function Tests: Recent Labs  Lab 12/01/18 1014  AST 24  ALT 19  ALKPHOS 88  BILITOT 1.4*  PROT 6.8  ALBUMIN 3.4*   No results for input(s): LIPASE, AMYLASE in the last 168 hours. No results for input(s): AMMONIA in the last 168 hours. Coagulation Profile: No results for input(s): INR, PROTIME in the last 168 hours. Cardiac Enzymes: Recent Labs  Lab 12/01/18 1014  TROPONINI <0.03   BNP (last 3 results) No results for input(s): PROBNP in the last 8760 hours. HbA1C: No results for input(s): HGBA1C in the last 72 hours. CBG: Recent Labs  Lab 12/02/18 0750  GLUCAP 84   Lipid Profile: No results for input(s): CHOL, HDL, LDLCALC, TRIG, CHOLHDL, LDLDIRECT in the last 72 hours. Thyroid Function Tests: Recent Labs    12/01/18 1501  TSH 2.769   Anemia Panel: Recent Labs    12/01/18 1501  FERRITIN 48  TIBC 335  IRON 68   Urine analysis: No results found for: COLORURINE, APPEARANCEUR, LABSPEC, PHURINE, GLUCOSEU, HGBUR, BILIRUBINUR, KETONESUR, PROTEINUR, UROBILINOGEN, NITRITE, LEUKOCYTESUR Sepsis Labs: Invalid input(s): PROCALCITONIN, LACTICIDVEN  Recent Results (from the past 240 hour(s))  Culture, blood (routine x 2)     Status: None (Preliminary result)   Collection Time: 12/01/18  3:00 PM  Result Value Ref Range Status   Specimen Description BLOOD RIGHT  ANTECUBITAL  Final   Special Requests   Final    BOTTLES DRAWN AEROBIC ONLY Blood Culture adequate volume   Culture   Final    NO GROWTH < 24 HOURS Performed at [facility], [address], [place], [place] [address]    Report Status PENDING  Incomplete  MRSA PCR Screening     Status: None   Collection Time: 12/01/18  3:00 PM  Result Value Ref Range Status   MRSA by PCR NEGATIVE NEGATIVE Final    Comment:        The GeneXpert MRSA Assay (FDA approved for NASAL specimens only), is one component of a comprehensive MRSA colonization surveillance program. It is not intended to diagnose MRSA infection nor to guide or monitor treatment for MRSA infections. Performed at [facility], [place] [address], [place], [place] [address]   Culture, blood (routine x 2)     Status: None (Preliminary result)  Collection Time: 12/01/18  3:09 PM  Result Value Ref Range Status   Specimen Description BLOOD LEFT ANTECUBITAL  Final   Special Requests   Final    BOTTLES DRAWN AEROBIC ONLY Blood Culture results may not be optimal due to an inadequate volume of blood received in culture bottles   Culture   Final    NO GROWTH < 24 HOURS Performed at [facility] [address], [place], [place] [address]    Report Status PENDING  Incomplete      Radiology Studies: Dg Chest 2 View  Result Date: 12/01/2018 CLINICAL DATA:  Short of breath, cough for 2 days EXAM: CHEST - 2 VIEW COMPARISON:  Chest x-ray 03/24/2007 FINDINGS: There is opacity at the left lung base posteriorly most consistent with patchy left lower lobe pneumonia and small left pleural effusion. A small right pleural effusion and be excluded. There is cardiomegaly present and there may be superimposed mild pulmonary vascular congestion. Curvature of the thoracic spine is unchanged. IMPRESSION: 1. Left lower lobe opacity most consistent with pneumonia and small left pleural effusion. 2. Cardiomegaly. Can not exclude very mild  pulmonary vascular congestion. Electronically Signed   By: Ivar Drape M.D.   On: 12/01/2018 11:00     Marzetta Board, MD, PhD [facility] Pager [phone]  If 7PM-7AM, please contact night-coverage [email or web address] Password [email or web address] 12/02/2018, 1:14 PM

## 2018-12-02 NOTE — Evaluation (Signed)
Physical Therapy Evaluation Patient Details Name: Alicia Sellers MRN: [id number] DOB: 04-16-1929 Today's Date: 12/02/2018   History of Present Illness  84 y.o. female with PMH of CKD, anemia, and OA presented to the emergency department for evaluation of progressively worsening fatigue, shortness of breath.     Clinical Impression  Pt admitted with above diagnosis. Pt currently with functional limitations due to the deficits listed below (see PT Problem List). On eval, pt required supervision transfers and min guard assist ambulation 180 feet without AD. SpO2 95% on RA during mobility.  Pt will benefit from skilled PT to increase their independence and safety with mobility to allow discharge home.  PT to follow acutely. No follow up services indicated.      Follow Up Recommendations No PT follow up;Supervision - Intermittent    Equipment Recommendations  None recommended by PT    Recommendations for Other Services       Precautions / Restrictions Precautions Precautions: Fall Restrictions Weight Bearing Restrictions: No      Mobility  Bed Mobility Overal bed mobility: Modified Independent                Transfers Overall transfer level: Needs assistance Equipment used: None Transfers: Sit to/from Stand;Stand Pivot Transfers Sit to Stand: Supervision Stand pivot transfers: Supervision       General transfer comment: supervision for safety  Ambulation/Gait Ambulation/Gait assistance: Min guard Gait Distance (Feet): 180 Feet Assistive device: None Gait Pattern/deviations: Step-through pattern;Decreased stride length Gait velocity: decreased Gait velocity interpretation: <1.31 ft/sec, indicative of household ambulator General Gait Details: slow, steady gait. Pt with 7.7 Hgb but assymptomatic. SpO2 95% on RA.   Stairs            Wheelchair Mobility    Modified Rankin (Stroke Patients Only)       Balance Overall balance assessment: Mild deficits  observed, not formally tested                                           Pertinent Vitals/Pain Pain Assessment: No/denies pain    Home Living Family/patient expects to be discharged to:: Private residence Living Arrangements: Other relatives(granddaughter) Available Help at Discharge: Family;Available 24 hours/day Type of Home: House Home Access: Ramped entrance     Home Layout: One level Home Equipment: None      Prior Function Level of Independence: Independent         Comments: Drives. Does cooking and cleaning.     Hand Dominance        Extremity/Trunk Assessment   Upper Extremity Assessment Upper Extremity Assessment: Generalized weakness    Lower Extremity Assessment Lower Extremity Assessment: Generalized weakness    Cervical / Trunk Assessment Cervical / Trunk Assessment: Kyphotic  Communication   Communication: No difficulties  Cognition Arousal/Alertness: Awake/alert Behavior During Therapy: WFL for tasks assessed/performed Overall Cognitive Status: Within Functional Limits for tasks assessed                                        General Comments      Exercises     Assessment/Plan    PT Assessment Patient needs continued PT services  PT Problem List Decreased strength;Decreased balance;Decreased mobility;Decreased activity tolerance       PT Treatment Interventions Functional mobility training;Balance  training;Patient/family education;Gait training;Therapeutic activities;Therapeutic exercise    PT Goals (Current goals can be found in the Care Plan section)  Acute Rehab PT Goals Patient Stated Goal: home PT Goal Formulation: With patient Time For Goal Achievement: 12/09/18 Potential to Achieve Goals: Good    Frequency Min 3X/week   Barriers to discharge        Co-evaluation               AM-PAC PT "6 Clicks" Mobility  Outcome Measure Help needed turning from your back to your side  while in a flat bed without using bedrails?: None Help needed moving from lying on your back to sitting on the side of a flat bed without using bedrails?: None Help needed moving to and from a bed to a chair (including a wheelchair)?: None Help needed standing up from a chair using your arms (e.g., wheelchair or bedside chair)?: None Help needed to walk in hospital room?: A Little Help needed climbing 3-5 steps with a railing? : A Little 6 Click Score: 22    End of Session Equipment Utilized During Treatment: Gait belt Activity Tolerance: Patient tolerated treatment well Patient left: in chair;with call bell/phone within reach;with family/visitor present Nurse Communication: Mobility status PT Visit Diagnosis: Muscle weakness (generalized) (M62.81)    Time: 1211-1227 PT Time Calculation (min) (ACUTE ONLY): 16 min   Charges:   PT Evaluation $PT Eval Low Complexity: 1 Low          Lorrin Goodell, PT  Office # [phone] Pager [phone]   Alicia Sellers 12/02/2018, 12:37 PM

## 2018-12-03 DIAGNOSIS — Z9049 Acquired absence of other specified parts of digestive tract: Secondary | ICD-10-CM | POA: Diagnosis not present

## 2018-12-03 DIAGNOSIS — Z79890 Hormone replacement therapy (postmenopausal): Secondary | ICD-10-CM | POA: Diagnosis not present

## 2018-12-03 DIAGNOSIS — I130 Hypertensive heart and chronic kidney disease with heart failure and stage 1 through stage 4 chronic kidney disease, or unspecified chronic kidney disease: Secondary | ICD-10-CM | POA: Diagnosis not present

## 2018-12-03 DIAGNOSIS — R0602 Shortness of breath: Secondary | ICD-10-CM | POA: Diagnosis not present

## 2018-12-03 DIAGNOSIS — E039 Hypothyroidism, unspecified: Secondary | ICD-10-CM | POA: Diagnosis not present

## 2018-12-03 DIAGNOSIS — Z79899 Other long term (current) drug therapy: Secondary | ICD-10-CM | POA: Diagnosis not present

## 2018-12-03 DIAGNOSIS — I10 Essential (primary) hypertension: Secondary | ICD-10-CM | POA: Diagnosis not present

## 2018-12-03 DIAGNOSIS — Z880 Allergy status to penicillin: Secondary | ICD-10-CM | POA: Diagnosis not present

## 2018-12-03 DIAGNOSIS — E872 Acidosis: Secondary | ICD-10-CM | POA: Diagnosis not present

## 2018-12-03 DIAGNOSIS — R0609 Other forms of dyspnea: Secondary | ICD-10-CM | POA: Diagnosis not present

## 2018-12-03 DIAGNOSIS — F419 Anxiety disorder, unspecified: Secondary | ICD-10-CM | POA: Diagnosis present

## 2018-12-03 DIAGNOSIS — N179 Acute kidney failure, unspecified: Secondary | ICD-10-CM | POA: Diagnosis present

## 2018-12-03 DIAGNOSIS — I700 Atherosclerosis of aorta: Secondary | ICD-10-CM | POA: Diagnosis present

## 2018-12-03 DIAGNOSIS — Z841 Family history of disorders of kidney and ureter: Secondary | ICD-10-CM | POA: Diagnosis not present

## 2018-12-03 DIAGNOSIS — Z79891 Long term (current) use of opiate analgesic: Secondary | ICD-10-CM | POA: Diagnosis not present

## 2018-12-03 DIAGNOSIS — I272 Pulmonary hypertension, unspecified: Secondary | ICD-10-CM | POA: Diagnosis present

## 2018-12-03 DIAGNOSIS — D464 Refractory anemia, unspecified: Secondary | ICD-10-CM | POA: Diagnosis present

## 2018-12-03 DIAGNOSIS — Z7982 Long term (current) use of aspirin: Secondary | ICD-10-CM | POA: Diagnosis not present

## 2018-12-03 DIAGNOSIS — I083 Combined rheumatic disorders of mitral, aortic and tricuspid valves: Secondary | ICD-10-CM | POA: Diagnosis present

## 2018-12-03 DIAGNOSIS — N184 Chronic kidney disease, stage 4 (severe): Secondary | ICD-10-CM | POA: Diagnosis not present

## 2018-12-03 DIAGNOSIS — I5041 Acute combined systolic (congestive) and diastolic (congestive) heart failure: Secondary | ICD-10-CM | POA: Diagnosis not present

## 2018-12-03 DIAGNOSIS — E034 Atrophy of thyroid (acquired): Secondary | ICD-10-CM | POA: Diagnosis not present

## 2018-12-03 DIAGNOSIS — M1990 Unspecified osteoarthritis, unspecified site: Secondary | ICD-10-CM | POA: Diagnosis present

## 2018-12-03 DIAGNOSIS — D631 Anemia in chronic kidney disease: Secondary | ICD-10-CM | POA: Diagnosis not present

## 2018-12-03 DIAGNOSIS — I447 Left bundle-branch block, unspecified: Secondary | ICD-10-CM | POA: Diagnosis present

## 2018-12-03 DIAGNOSIS — Z9089 Acquired absence of other organs: Secondary | ICD-10-CM | POA: Diagnosis not present

## 2018-12-03 DIAGNOSIS — R69 Illness, unspecified: Secondary | ICD-10-CM | POA: Diagnosis not present

## 2018-12-03 DIAGNOSIS — J189 Pneumonia, unspecified organism: Secondary | ICD-10-CM | POA: Diagnosis not present

## 2018-12-03 DIAGNOSIS — E875 Hyperkalemia: Secondary | ICD-10-CM | POA: Diagnosis not present

## 2018-12-03 DIAGNOSIS — I5021 Acute systolic (congestive) heart failure: Secondary | ICD-10-CM | POA: Diagnosis not present

## 2018-12-03 LAB — CBC
HCT: 23.1 % — ABNORMAL LOW (ref 36.0–46.0)
Hemoglobin: 7.5 g/dL — ABNORMAL LOW (ref 12.0–15.0)
MCH: 31 pg (ref 26.0–34.0)
MCHC: 32.5 g/dL (ref 30.0–36.0)
MCV: 95.5 fL (ref 80.0–100.0)
Platelets: 262 10*3/uL (ref 150–400)
RBC: 2.42 MIL/uL — ABNORMAL LOW (ref 3.87–5.11)
RDW: 14.7 % (ref 11.5–15.5)
WBC: 2.6 10*3/uL — ABNORMAL LOW (ref 4.0–10.5)
nRBC: 0 % (ref 0.0–0.2)

## 2018-12-03 LAB — BASIC METABOLIC PANEL
Anion gap: 12 (ref 5–15)
BUN: 36 mg/dL — ABNORMAL HIGH (ref 8–23)
CO2: 18 mmol/L — ABNORMAL LOW (ref 22–32)
Calcium: 8.2 mg/dL — ABNORMAL LOW (ref 8.9–10.3)
Chloride: 102 mmol/L (ref 98–111)
Creatinine, Ser: 2.66 mg/dL — ABNORMAL HIGH (ref 0.44–1.00)
GFR calc Af Amer: 18 mL/min — ABNORMAL LOW (ref >60)
GFR calc non Af Amer: 16 mL/min — ABNORMAL LOW (ref >60)
Glucose, Bld: 86 mg/dL (ref 70–99)
Potassium: 4.2 mmol/L (ref 3.5–5.1)
Sodium: 132 mmol/L — ABNORMAL LOW (ref 135–145)

## 2018-12-03 LAB — GLUCOSE, CAPILLARY: Glucose-Capillary: 83 mg/dL (ref 70–99)

## 2018-12-03 LAB — URIC ACID: Uric Acid, Serum: 4.2 mg/dL (ref 2.5–7.1)

## 2018-12-03 MED ORDER — ALLOPURINOL 100 MG PO TABS
100.0000 mg | ORAL_TABLET | Freq: Every day | ORAL | Status: DC
  Administered 2018-12-03 – 2018-12-05 (×3): 100 mg via ORAL
  Filled 2018-12-03 (×2): qty 1

## 2018-12-03 MED ORDER — MILRINONE LACTATE IN DEXTROSE 20-5 MG/100ML-% IV SOLN
0.1250 ug/kg/min | INTRAVENOUS | Status: DC
  Administered 2018-12-03: 0.125 ug/kg/min via INTRAVENOUS
  Filled 2018-12-03: qty 100

## 2018-12-03 NOTE — Progress Notes (Signed)
Progress Notes       PROGRESS NOTE  Alicia Sellers VCB:[id number] DOB: [birth year] DOA: 12/01/2018 PCP: Gaynelle Arabian, MD   LOS: 0 days   Brief Narrative / Interim history: 84 year old female with chronic kidney disease stage IV, hypertension, anxiety, hypothyroidism who was admitted on 12/26 with worsening dyspnea on exertion.  She was found to have evidence of fluid overload on the chest x-ray and was given Lasix x1 on admission.  She was also placed on antibiotics out of concern for pneumonia  Subjective: -Improved, still dyspneic with activities but much better than when she was at home  Assessment & Plan: Active Problems:   Dyspnea   HTN (hypertension)   CKD (chronic kidney disease), stage IV (HCC)   Hypothyroidism   Anxiety   Anemia due to chronic kidney disease   Metabolic acidosis, normal anion gap (NAG)   Principal Problem Dyspnea on exertion and at rest, multifactorial concern for CHF versus pneumonia -Chest x-ray on admission with fluid overload with cardiomegaly, slight pulmonary vascular congestion elevated BNP -Has received IV Lasix admission, she appears euvolemic clinically and hold further diuresis -Chest x-ray was also concerning for pneumonia, she did have a cough but no fever or chills.  Started on ceftriaxone and azithromycin, continue, will narrow on discharge  Additional Problems Acute combined systolic and diastolic CHF -2D echo done 12/27 showed an EF of 40-45% with global hypokinesis, grade 3 diastolic dysfunction.  This is new, she has no history of CHF, I have consulted cardiology, appreciate input  Hypertension -Continue Norvasc, hydralazine, metoprolol.  Blood pressure stable  Hypothyroidism -Continue levothyroxine  Chronic kidney disease stage IV -I am not clear about baseline, creatinine on admission 2.25, worsening to 2.4 now 2.6 after just 1 dose of Lasix.  She is also been complaining of intermittent nausea over the last 1 to 2 weeks.   She and her family tells me that her kidney function has gradually gotten worse in the last month and she is now stage IV, prior to this month being stage III. -Given presentation with fluid overload, hyperkalemia yesterday, I have consulted nephrology, appreciate input.  I wonder whether she needs diuretics at home  Anemia of chronic disease -Likely in the setting of chronic kidney disease, continue oral iron, no bleeding  Anxiety -Continue PRN lorazepam  Scheduled Meds: . amLODipine  5 mg Oral Daily  . aspirin  81 mg Oral Q2000  . citalopram  10 mg Oral Daily  . ferrous sulfate  325 mg Oral Daily  . gemfibrozil  600 mg Oral BID AC  . heparin  5,000 Units Subcutaneous Q8H  . hydrALAZINE  25 mg Oral TID  . levothyroxine  50 mcg Oral Q0600  . metoprolol succinate  25 mg Oral Q2000   Continuous Infusions: . azithromycin 500 mg (12/03/18 1311)  . cefTRIAXone (ROCEPHIN)  IV 1 g (12/02/18 1336)   PRN Meds:.LORazepam, meclizine, ondansetron **OR** ondansetron (ZOFRAN) IV, senna-docusate, traMADol  DVT prophylaxis: heparin Code Status: Full code Family Communication: no family at bedside  Disposition Plan: home when ready   Consultants:   None   Procedures:   2D echo Impressions: - LVEF 40-45%, global hypokinesis, moderate LVH, grade 3 DD, very high LV filling pressure, aortic sclerosis, trivial AI, mild to moderate MR, severe LAE, mild RVE with mild systolic dysfunction, moderate TR, RVSP 56 mmHg, dilated IVC.  Antimicrobials:  Ceftriaxone / Azithromycin 12/16 >>   Objective: Vitals:   12/02/18 1711 12/02/18 2324 12/03/18 0500 12/03/18 0818  BP: (!) 160/60 (!) 144/58  (!) 154/56  Pulse: 67 61  61  Resp: 15 18  12   Temp: (!) 97.5 F (36.4 C) 97.9 F (36.6 C)  98.3 F (36.8 C)  TempSrc: Oral Oral  Oral  SpO2: 99% 96%  98%  Weight:   60.4 kg   Height:        Intake/Output Summary (Last 24 hours) at 12/03/2018 1317 Last data filed at 12/03/2018 1200 Gross per 24  hour  Intake 590 ml  Output 1100 ml  Net -510 ml   Filed Weights   12/01/18 1400 12/02/18 0538 12/03/18 0500  Weight: 73.9 kg 58.3 kg 60.4 kg    Examination:  Constitutional: No distress Eyes: No scleral icterus ENMT: Moist mucous membranes Neck: normal, supple Respiratory: Clear bilaterally without wheezing or significant crackles, diminished sounds at the bases, normal respiratory effort Cardiovascular: Regular rate and rhythm, no murmurs appreciated.  No peripheral edema Abdomen: Soft, nontender, nondistended, bowel sounds positive Musculoskeletal: no clubbing / cyanosis. Skin: No rashes appreciated Neurologic: No focal deficits Psychiatric: Normal judgment and insight. Alert and oriented x 3. Normal mood.    Data Reviewed: I have independently reviewed following labs and imaging studies   CBC: Recent Labs  Lab 12/01/18 1014 12/02/18 0225 12/03/18 0304  WBC 4.4 3.3* 2.6*  NEUTROABS 3.6  --   --   HGB 9.4* 7.7* 7.5*  HCT 28.5* 22.9* 23.1*  MCV 98.6 97.4 95.5  PLT 314 283 476   Basic Metabolic Panel: Recent Labs  Lab 12/01/18 1014 12/02/18 0225 12/03/18 0304  NA 130* 130* 132*  K 4.2 5.4* 4.2  CL 102 101 102  CO2 15* 19* 18*  GLUCOSE 135* 88 86  BUN 34* 34* 36*  CREATININE 2.25* 2.44* 2.66*  CALCIUM 8.7* 8.5* 8.2*   GFR: Estimated Creatinine Clearance: 12.4 mL/min (A) (by C-G formula based on SCr of 2.66 mg/dL (H)). Liver Function Tests: Recent Labs  Lab 12/01/18 1014  AST 24  ALT 19  ALKPHOS 88  BILITOT 1.4*  PROT 6.8  ALBUMIN 3.4*   No results for input(s): LIPASE, AMYLASE in the last 168 hours. No results for input(s): AMMONIA in the last 168 hours. Coagulation Profile: No results for input(s): INR, PROTIME in the last 168 hours. Cardiac Enzymes: Recent Labs  Lab 12/01/18 1014  TROPONINI <0.03   BNP (last 3 results) No results for input(s): PROBNP in the last 8760 hours. HbA1C: No results for input(s): HGBA1C in the last 72  hours. CBG: Recent Labs  Lab 12/02/18 0750 12/03/18 0801  GLUCAP 84 83   Lipid Profile: No results for input(s): CHOL, HDL, LDLCALC, TRIG, CHOLHDL, LDLDIRECT in the last 72 hours. Thyroid Function Tests: Recent Labs    12/01/18 1501  TSH 2.769   Anemia Panel: Recent Labs    12/01/18 1501  FERRITIN 48  TIBC 335  IRON 68   Urine analysis: No results found for: COLORURINE, APPEARANCEUR, LABSPEC, PHURINE, GLUCOSEU, HGBUR, BILIRUBINUR, KETONESUR, PROTEINUR, UROBILINOGEN, NITRITE, LEUKOCYTESUR Sepsis Labs: Invalid input(s): PROCALCITONIN, LACTICIDVEN  Recent Results (from the past 240 hour(s))  Culture, blood (routine x 2)     Status: None (Preliminary result)   Collection Time: 12/01/18  3:00 PM  Result Value Ref Range Status   Specimen Description BLOOD RIGHT ANTECUBITAL  Final   Special Requests   Final    BOTTLES DRAWN AEROBIC ONLY Blood Culture adequate volume   Culture   Final    NO GROWTH 2 DAYS Performed at  [facility], [place] [address], [place], [place] [address]    Report Status PENDING  Incomplete  MRSA PCR Screening     Status: None   Collection Time: 12/01/18  3:00 PM  Result Value Ref Range Status   MRSA by PCR NEGATIVE NEGATIVE Final    Comment:        The GeneXpert MRSA Assay (FDA approved for NASAL specimens only), is one component of a comprehensive MRSA colonization surveillance program. It is not intended to diagnose MRSA infection nor to guide or monitor treatment for MRSA infections. Performed at [facility], [place] [address] [place], [place] [address]   Culture, blood (routine x 2)     Status: None (Preliminary result)   Collection Time: 12/01/18  3:09 PM  Result Value Ref Range Status   Specimen Description BLOOD LEFT ANTECUBITAL  Final   Special Requests   Final    BOTTLES DRAWN AEROBIC ONLY Blood Culture results may not be optimal due to an inadequate volume of blood received in culture bottles   Culture   Final    NO  GROWTH 2 DAYS Performed at [facility], [place] [address] [place], [place] [address]    Report Status PENDING  Incomplete      Radiology Studies: No results found.   Marzetta Board, MD, PhD [facility] Pager [phone]  If 7PM-7AM, please contact night-coverage [email or web address] Password [email or web address] 12/03/2018, 1:17 PM

## 2018-12-03 NOTE — Consult Note (Signed)
Referring Physician: Jimmye Norman, MD  Alicia Sellers is an 84 y.o. female.                       Chief Complaint: Shortness of breath on exertion  HPI: 84 year old female with hypertension, CKD, hypothyroidism and anxiety had increasing exertional shortness of breath. She had one dose of IV lasix with significant improvement. She is on antibiotics for possible pneumonia. Her recent echocardiogram showed moderate LVH, Decreased LV systolic function to 07-37 %, grade 3 diastolic dysfunction, moderate MR and moderate pulmonary systolic hypertension. She denies chest pain. Her BNP is markedly elevated at 4053.3 and creatinine is now 2.66 from 2.25 on admission. Her Hgb is down to 7.5 g. With normal iron level.  Past Medical History:  Diagnosis Date  . Anemia   . Anxiety   . Arthritis   . Chronic kidney disease   . Dyspnea   . Headache   . Hypertension   . Hypothyroidism       Past Surgical History:  Procedure Laterality Date  . APPENDECTOMY    . BREAST BIOPSY Left 2015   benign  . EYE SURGERY    . TONSILLECTOMY      Family History  Problem Relation Age of Onset  . Breast cancer Neg Hx    Social History:  reports that she has never smoked. She has never used smokeless tobacco. She reports that she does not drink alcohol or use drugs.  Allergies:  Allergies  Allergen Reactions  . Latex Rash  . Penicillins Rash    DID THE REACTION INVOLVE: Swelling of the face/tongue/throat, SOB, or low BP? No Sudden or severe rash/hives, skin peeling, or the inside of the mouth or nose? No Did it require medical treatment? No When did it last happen? If all above answers are "NO", may proceed with cephalosporin use.    Medications Prior to Admission  Medication Sig Dispense Refill  . AMLODIPINE BENZOATE PO Take 5 mg by mouth daily.    Marland Kitchen aspirin 81 MG chewable tablet Chew 81 mg by mouth daily.    . Calcium Carbonate-Vitamin D (CALCIUM 500 + D) 500-125 MG-UNIT TABS  Take 1 tablet by mouth 2 (two) times daily. Taking 600-400    . citalopram (CELEXA) 10 MG tablet Take 10 mg by mouth daily.    Marland Kitchen denosumab (PROLIA) 60 MG/ML SOSY injection Inject 60 mg into the skin every 6 (six) months.    Marland Kitchen epoetin alfa-epbx (RETACRIT) 10626 UNIT/ML injection Inject 10,000 Units into the skin once a week.    . fexofenadine (ALLEGRA) 180 MG tablet Take 180 mg by mouth daily.    Marland Kitchen gemfibrozil (LOPID) 600 MG tablet Take 600 mg by mouth 2 (two) times daily before a meal.    . hydrALAZINE (APRESOLINE) 25 MG tablet Take 25 mg by mouth 3 (three) times daily.    . Iron Combinations (IRON COMPLEX) CAPS Take 1 capsule by mouth daily.    Marland Kitchen levothyroxine (SYNTHROID, LEVOTHROID) 50 MCG tablet Take 50 mcg by mouth daily before breakfast.    . LORazepam (ATIVAN) 0.5 MG tablet Take 0.5 mg by mouth 2 (two) times daily.    . meclizine (ANTIVERT) 25 MG tablet Take 25 mg by mouth 3 (three) times daily as needed for dizziness.    . metaxalone (SKELAXIN) 800 MG tablet Take 800 mg by mouth 2 (two) times daily.    . metoprolol succinate (TOPROL-XL) 25 MG 24 hr  tablet Take 25 mg by mouth daily.    . Multiple Vitamins-Minerals (MULTI COMPLETE) CAPS Take 1 tablet by mouth daily.    . Omega-3 Fatty Acids (FISH OIL) 1000 MG CAPS Take 1,000 capsules by mouth 2 (two) times daily with a meal.    . Red Yeast Rice 600 MG TABS Take 600 tablets by mouth 2 (two) times daily.    . traMADol (ULTRAM) 50 MG tablet Take by mouth every 6 (six) hours as needed.    . vitamin E 400 UNIT capsule Take 400 Units by mouth daily.    . Ketoprofen POWD Ketoprofen 20% gel aaa tid-qid (Patient not taking: Reported on 12/01/2018) 60 g 1    Results for orders placed or performed during the hospital encounter of 12/01/18 (from the past 48 hour(s))  Influenza panel by PCR (type A & B)     Status: None   Collection Time: 12/01/18  2:15 PM  Result Value Ref Range   Influenza A By PCR NEGATIVE NEGATIVE   Influenza B By PCR NEGATIVE  NEGATIVE    Comment: (NOTE) The Xpert Xpress Flu assay is intended as an aid in the diagnosis of  influenza and should not be used as a sole basis for treatment.  This  assay is FDA approved for nasopharyngeal swab specimens only. Nasal  washings and aspirates are unacceptable for Xpert Xpress Flu testing. Performed at [facility], [place] [place], [place] [address]   Culture, blood (routine x 2)     Status: None (Preliminary result)   Collection Time: 12/01/18  3:00 PM  Result Value Ref Range   Specimen Description BLOOD RIGHT ANTECUBITAL    Special Requests      BOTTLES DRAWN AEROBIC ONLY Blood Culture adequate volume   Culture      NO GROWTH 2 DAYS Performed at [place] [address] [place], [place] [address]    Report Status PENDING   MRSA PCR Screening     Status: None   Collection Time: 12/01/18  3:00 PM  Result Value Ref Range   MRSA by PCR NEGATIVE NEGATIVE    Comment:        The GeneXpert MRSA Assay (FDA approved for NASAL specimens only), is one component of a comprehensive MRSA colonization surveillance program. It is not intended to diagnose MRSA infection nor to guide or monitor treatment for MRSA infections. Performed at [facility], [place] [address], [place], [place] [address]   TSH     Status: None   Collection Time: 12/01/18  3:01 PM  Result Value Ref Range   TSH 2.769 0.350 - 4.500 uIU/mL    Comment: Performed by a 3rd Generation assay with a functional sensitivity of <=0.01 uIU/mL. Performed at [facility], [place] [address], [place], [place] [address]   Iron and TIBC     Status: None   Collection Time: 12/01/18  3:01 PM  Result Value Ref Range   Iron 68 28 - 170 ug/dL   TIBC 335 250 - 450 ug/dL   Saturation Ratios 20 10.4 - 31.8 %   UIBC 267 ug/dL    Comment: Performed at [facility], [place] [address], [place], [place] [address]  Ferritin     Status: None   Collection Time: 12/01/18  3:01 PM   Result Value Ref Range   Ferritin 48 11 - 307 ng/mL    Comment: Performed at [facility], [place]  [address] [place], [place] [address]  Culture, blood (routine x 2)     Status: None (Preliminary result)   Collection Time: 12/01/18  3:09 PM  Result Value Ref Range   Specimen Description BLOOD LEFT ANTECUBITAL    Special Requests      BOTTLES DRAWN AEROBIC ONLY Blood Culture results may not be optimal due to an inadequate volume of blood received in culture bottles   Culture      NO GROWTH 2 DAYS Performed at [facility] [address], [place], [place] [address]    Report Status PENDING   Basic metabolic panel     Status: Abnormal   Collection Time: 12/02/18  2:25 AM  Result Value Ref Range   Sodium 130 (L) 135 - 145 mmol/L   Potassium 5.4 (H) 3.5 - 5.1 mmol/L    Comment: DELTA CHECK NOTED   Chloride 101 98 - 111 mmol/L   CO2 19 (L) 22 - 32 mmol/L   Glucose, Bld 88 70 - 99 mg/dL   BUN 34 (H) 8 - 23 mg/dL   Creatinine, Ser 2.44 (H) 0.44 - 1.00 mg/dL   Calcium 8.5 (L) 8.9 - 10.3 mg/dL   GFR calc non Af Amer 17 (L) >60 mL/min   GFR calc Af Amer 20 (L) >60 mL/min   Anion gap 10 5 - 15    Comment: Performed at [facility], [place] [address], [place], [place] [address]  CBC     Status: Abnormal   Collection Time: 12/02/18  2:25 AM  Result Value Ref Range   WBC 3.3 (L) 4.0 - 10.5 K/uL   RBC 2.35 (L) 3.87 - 5.11 MIL/uL   Hemoglobin 7.7 (L) 12.0 - 15.0 g/dL   HCT 22.9 (L) 36.0 - 46.0 %   MCV 97.4 80.0 - 100.0 fL   MCH 32.8 26.0 - 34.0 pg   MCHC 33.6 30.0 - 36.0 g/dL   RDW 15.0 11.5 - 15.5 %   Platelets 283 150 - 400 K/uL   nRBC 0.0 0.0 - 0.2 %    Comment: Performed at [facility], [place] [address], [place], [place] [address]  Glucose, capillary     Status: None   Collection Time: 12/02/18  7:50 AM  Result Value Ref Range   Glucose-Capillary 84 70 - 99 mg/dL  CBC     Status: Abnormal   Collection Time: 12/03/18  3:04 AM  Result Value Ref Range   WBC 2.6  (L) 4.0 - 10.5 K/uL   RBC 2.42 (L) 3.87 - 5.11 MIL/uL   Hemoglobin 7.5 (L) 12.0 - 15.0 g/dL   HCT 23.1 (L) 36.0 - 46.0 %   MCV 95.5 80.0 - 100.0 fL   MCH 31.0 26.0 - 34.0 pg   MCHC 32.5 30.0 - 36.0 g/dL   RDW 14.7 11.5 - 15.5 %   Platelets 262 150 - 400 K/uL   nRBC 0.0 0.0 - 0.2 %    Comment: Performed at [facility], [place] [address] [place],  [address]  Basic metabolic panel     Status: Abnormal   Collection Time: 12/03/18  3:04 AM  Result Value Ref Range   Sodium 132 (L) 135 - 145 mmol/L   Potassium 4.2 3.5 - 5.1 mmol/L    Comment: DELTA CHECK NOTED   Chloride 102 98 - 111 mmol/L   CO2 18 (L) 22 - 32 mmol/L   Glucose, Bld 86 70 - 99 mg/dL  BUN 36 (H) 8 - 23 mg/dL   Creatinine, Ser 2.66 (H) 0.44 - 1.00 mg/dL   Calcium 8.2 (L) 8.9 - 10.3 mg/dL   GFR calc non Af Amer 16 (L) >60 mL/min   GFR calc Af Amer 18 (L) >60 mL/min   Anion gap 12 5 - 15    Comment: Performed at [facility] [address] [place], [place] [address]  Glucose, capillary     Status: None   Collection Time: 12/03/18  8:01 AM  Result Value Ref Range   Glucose-Capillary 83 70 - 99 mg/dL   No results found.  Review Of Systems Constitutional: No fever, chills, weight loss or gain. Eyes: No vision change, wears glasses. No discharge or pain. Ears: No hearing loss, No tinnitus. Respiratory: No asthma, COPD, Positive pneumonias. Positive shortness of breath. No hemoptysis. Cardiovascular: No chest pain, palpitation, leg edema. Gastrointestinal: No nausea, vomiting, diarrhea, constipation. No GI bleed. No hepatitis. Genitourinary: No dysuria, hematuria, kidney stone. No incontinance. Neurological: No headache, stroke, seizures.  Psychiatry: No psych facility admission for anxiety, depression, suicide. No detox. Skin: No rash. Musculoskeletal: Positive joint pain, no fibromyalgia. No neck pain, back pain. Lymphadenopathy: No lymphadenopathy. Hematology: Positive anemia or easy  bruising.   Blood pressure (!) 154/56, pulse 61, temperature 98.3 F (36.8 C), temperature source Oral, resp. rate 12, height 5\' 1"  (1.549 m), weight 60.4 kg, SpO2 98 %. Body mass index is 25.16 kg/m. General appearance: alert, cooperative, appears stated age and no distress Head: Normocephalic, atraumatic. Eyes: Blue eyes, pale conjunctiva, corneas clear. PERRL, EOM's intact. Neck: No adenopathy, no carotid bruit, no JVD, supple, symmetrical, trachea midline and thyroid not enlarged. Resp: Clear to auscultation bilaterally. Cardio: Regular rate and rhythm, S1, S2 normal, III/VI systolic murmur, no click, rub or gallop GI: Soft, non-tender; bowel sounds normal; no organomegaly. Extremities: No edema, cyanosis or clubbing. Skin: Warm and dry.  Neurologic: Alert and oriented X 3, normal strength. Normal coordination.  Assessment/Plan Acute systolic left heart failure, HFrEF R/O CAD HTN CKD, stage IV Hypothyroidism Anemia due to chronic disease  Discussed right and left heart catheterization V/S medical treatment. Patient agrees to medical therapy for now. She is not a candidate for ACE inhibitor or ARB. Consider low dose inotropic agent with B-blocker and vasodilators. Check uric acid level.  Birdie Riddle, MD  12/03/2018, 2:01 PM

## 2018-12-03 NOTE — Consult Note (Signed)
Alicia Sellers Renal Consultation Note  Requesting MD:  Indication for Consultation:  AKI  Chief complaint: fluid overload  HPI:  Alicia Sellers is a 84 y.o. female with a history of CKD and HTN who presented with dyspnea on exertion and fluid overload.  She received lasix IV x 2 doses and creatinine has increased from 2.25 to 2.66 at time of consult.  Cardiology has been consulted and note the addition of milrinone.  On review of clinic records, the patient follows with Dr. Justin Mend and has known CKD stage IV.  She was last seen in August 2019 in our office and she was referred for treatment options/CKD education class at that time.  Also CKD related, she has been receiving retacrit injections for anemia through our office.      Lab trends:  07/19/2018 Cr 2.26 with a GFR of 19.   07/15/18 - Creatinine 2.15 with a potassium of 5.1  04/2018 - Cr 2.13  08/2016 - Cr 1.8 08/2016 with a GFR of 25  (also note Hemoglobin was 7.9 on 11/24/2018)   Creatinine, Ser  Date/Time Value Ref Range Status  12/03/2018 03:04 AM 2.66 (H) 0.44 - 1.00 mg/dL Final  12/02/2018 02:25 AM 2.44 (H) 0.44 - 1.00 mg/dL Final  12/01/2018 10:14 AM 2.25 (H) 0.44 - 1.00 mg/dL Final     PMHx:   Past Medical History:  Diagnosis Date  . Anemia   . Anxiety   . Arthritis   . Chronic kidney disease   . Dyspnea   . Headache   . Hypertension   . Hypothyroidism     Past Surgical History:  Procedure Laterality Date  . APPENDECTOMY    . BREAST BIOPSY Left 2015   benign  . EYE SURGERY    . TONSILLECTOMY      Family Hx:  Family History  Problem Relation Age of Onset  . Breast cancer Neg Hx   sister has CKD stage IV - she is unsure of etiology  Social History:  reports that she has never smoked. She has never used smokeless tobacco. She reports that she does not drink alcohol or use drugs.  Allergies:  Allergies  Allergen Reactions  . Latex Rash  . Penicillins Rash    DID THE REACTION INVOLVE:  Swelling of the face/tongue/throat, SOB, or low BP? No Sudden or severe rash/hives, skin peeling, or the inside of the mouth or nose? No Did it require medical treatment? No When did it last happen? If all above answers are "NO", may proceed with cephalosporin use.    Medications: Prior to Admission medications   Medication Sig Start Date End Date Taking? Authorizing Provider  AMLODIPINE BENZOATE PO Take 5 mg by mouth daily.   Yes [provider]  aspirin 81 MG chewable tablet Chew 81 mg by mouth daily.   Yes [provider]  Calcium Carbonate-Vitamin D (CALCIUM 500 + D) 500-125 MG-UNIT TABS Take 1 tablet by mouth 2 (two) times daily. Taking 600-400   Yes [provider]  citalopram (CELEXA) 10 MG tablet Take 10 mg by mouth daily.   Yes [provider]  denosumab (PROLIA) 60 MG/ML SOSY injection Inject 60 mg into the skin every 6 (six) months.   Yes [provider]  epoetin alfa-epbx (RETACRIT) 98119 UNIT/ML injection Inject 10,000 Units into the skin once a week.   Yes [provider]  fexofenadine (ALLEGRA) 180 MG tablet Take 180 mg by mouth daily.   Yes [provider]  gemfibrozil (LOPID) 600 MG tablet Take 600 mg by mouth 2 (two) times daily before a meal.   Yes [provider]  hydrALAZINE (APRESOLINE) 25 MG tablet Take 25 mg by mouth 3 (three) times daily.   Yes [provider]  Iron Combinations (IRON COMPLEX) CAPS Take 1 capsule by mouth daily.   Yes [provider]  levothyroxine (SYNTHROID, LEVOTHROID) 50 MCG tablet Take 50 mcg by mouth daily before breakfast.   Yes [provider]  LORazepam (ATIVAN) 0.5 MG tablet Take 0.5 mg by mouth 2 (two) times daily.   Yes [provider]  meclizine (ANTIVERT) 25 MG tablet Take 25 mg by mouth 3 (three) times daily as needed for dizziness.   Yes [provider]  metaxalone (SKELAXIN) 800 MG tablet Take 800 mg by mouth 2  (two) times daily.   Yes [provider]  metoprolol succinate (TOPROL-XL) 25 MG 24 hr tablet Take 25 mg by mouth daily.   Yes [provider]  Multiple Vitamins-Minerals (MULTI COMPLETE) CAPS Take 1 tablet by mouth daily.   Yes [provider]  Omega-3 Fatty Acids (FISH OIL) 1000 MG CAPS Take 1,000 capsules by mouth 2 (two) times daily with a meal.   Yes [provider]  Red Yeast Rice 600 MG TABS Take 600 tablets by mouth 2 (two) times daily.   Yes [provider]  traMADol (ULTRAM) 50 MG tablet Take by mouth every 6 (six) hours as needed.   Yes [provider]  vitamin E 400 UNIT capsule Take 400 Units by mouth daily.   Yes [provider]  Ketoprofen POWD Ketoprofen 20% gel aaa tid-qid Patient not taking: Reported on 12/01/2018 01/06/12   Claris Gower, MD    I have reviewed the patient's current medications.  Labs:  BMP Latest Ref Rng & Units 12/03/2018 12/02/2018 12/01/2018  Glucose 70 - 99 mg/dL 86 88 135(H)  BUN 8 - 23 mg/dL 36(H) 34(H) 34(H)  Creatinine 0.44 - 1.00 mg/dL 2.66(H) 2.44(H) 2.25(H)  Sodium 135 - 145 mmol/L 132(L) 130(L) 130(L)  Potassium 3.5 - 5.1 mmol/L 4.2 5.4(H) 4.2  Chloride 98 - 111 mmol/L 102 101 102  CO2 22 - 32 mmol/L 18(L) 19(L) 15(L)  Calcium 8.9 - 10.3 mg/dL 8.2(L) 8.5(L) 8.7(L)    Urinalysis No results found for: COLORURINE, APPEARANCEUR, LABSPEC, PHURINE, GLUCOSEU, HGBUR, BILIRUBINUR, KETONESUR, PROTEINUR, UROBILINOGEN, NITRITE, LEUKOCYTESUR   ROS:  Pertinent items noted in HPI and remainder of comprehensive ROS otherwise negative.  Physical Exam: Vitals:   12/03/18 0818 12/03/18 1639  BP: (!) 154/56 (!) 149/81  Pulse: 61 65  Resp: 12 12  Temp: 98.3 F (36.8 C) 98.2 F (36.8 C)  SpO2: 98% 99%     General: Elderly female seated in no acute distress at rest HEENT: Normocephalic atraumatic Eyes: Ocular movements intact sclera anicteric Neck: Supple trachea midline Heart:  S1-S2 regular rate and rhythm Lungs: Clear to auscultation bilaterally normal work of breathing at rest Abdomen: Soft nontender nondistended normal bowel sounds Extremities: No pitting edema appreciated no cyanosis or clubbing Skin: No rash on extremities exposed Neuro: Alert and oriented x3 and provides a history conversant Psych normal mood and affect  Assessment/Plan:  # CKD stage IV  - Secondary to microvascular disease from HTN  - Baseline Cr around 2.2 - She is slightly higher than baseline 2/2 pre-renal insults - Would defer any scheduled lasix for now.  Will hold off on any fluids at this time,  as well - BMP in AM - For discharge planning, discussed with patient that given her CKD we would recommend no further injections of prolia  # Dyspnea - noted empiric coverage for PNA per primary team and s/p diuresis as below for CHF.  Flu negative.  Noted LLL opacity favored to represent PNA per radiology  # Acute systolic and diastolic CHF  - s/p lasix once with improvement in symptoms - noted on milrinone - would defer scheduled lasix at this time   # HTN  - improved to 149/81 most recently  - Would avoid ACE/ARB with history of hyperkalemia  # Anemia 2/2 CKD  - She is on an ESA as an outpatient through our office  Claudia Desanctis 12/03/2018, 4:58 PM

## 2018-12-04 DIAGNOSIS — I5021 Acute systolic (congestive) heart failure: Secondary | ICD-10-CM

## 2018-12-04 LAB — BASIC METABOLIC PANEL
Anion gap: 13 (ref 5–15)
BUN: 34 mg/dL — ABNORMAL HIGH (ref 8–23)
CO2: 18 mmol/L — ABNORMAL LOW (ref 22–32)
Calcium: 8.1 mg/dL — ABNORMAL LOW (ref 8.9–10.3)
Chloride: 103 mmol/L (ref 98–111)
Creatinine, Ser: 2.72 mg/dL — ABNORMAL HIGH (ref 0.44–1.00)
GFR calc Af Amer: 17 mL/min — ABNORMAL LOW (ref >60)
GFR calc non Af Amer: 15 mL/min — ABNORMAL LOW (ref >60)
Glucose, Bld: 81 mg/dL (ref 70–99)
Potassium: 3.9 mmol/L (ref 3.5–5.1)
Sodium: 134 mmol/L — ABNORMAL LOW (ref 135–145)

## 2018-12-04 LAB — CBC
HCT: 22.3 % — ABNORMAL LOW (ref 36.0–46.0)
Hemoglobin: 7.4 g/dL — ABNORMAL LOW (ref 12.0–15.0)
MCH: 31.8 pg (ref 26.0–34.0)
MCHC: 33.2 g/dL (ref 30.0–36.0)
MCV: 95.7 fL (ref 80.0–100.0)
Platelets: 263 10*3/uL (ref 150–400)
RBC: 2.33 MIL/uL — ABNORMAL LOW (ref 3.87–5.11)
RDW: 14.8 % (ref 11.5–15.5)
WBC: 3.5 10*3/uL — ABNORMAL LOW (ref 4.0–10.5)
nRBC: 0 % (ref 0.0–0.2)

## 2018-12-04 LAB — OCCULT BLOOD X 1 CARD TO LAB, STOOL: Fecal Occult Bld: NEGATIVE

## 2018-12-04 LAB — PREPARE RBC (CROSSMATCH)

## 2018-12-04 LAB — ABO/RH: ABO/RH(D): O NEG

## 2018-12-04 LAB — GLUCOSE, CAPILLARY: Glucose-Capillary: 84 mg/dL (ref 70–99)

## 2018-12-04 MED ORDER — SODIUM CHLORIDE 0.9% IV SOLUTION
Freq: Once | INTRAVENOUS | Status: AC
  Administered 2018-12-04: 15:00:00 via INTRAVENOUS

## 2018-12-04 NOTE — Progress Notes (Signed)
[facility] Progress Note  Name: Alicia Sellers MRN: [id number] DOB: Oct 24, 1929   Subjective:  Spoke with patient and her daughter.  Her breathing is much better since admission.  She is about to get a unit of blood.  Strict ins/outs not available.  Per nursing, milrinone to be stopped after current bag is infused.  Review of systems:  Reports shortness of breath better.  She does have a cough  No n/v No cp ------------------- Background on consult:  Alicia Sellers is a 84 y.o. female with a history of CKD and HTN who presented with dyspnea on exertion and fluid overload.  She received lasix IV x 2 doses and creatinine has increased from 2.25 to 2.66 at time of consult.  Cardiology has been consulted and note the addition of milrinone.  On review of clinic records, the patient follows with Dr. Justin Mend and has known CKD stage IV.  She was last seen in August 2019 in our office and she was referred for treatment options/CKD education class at that time.  Also CKD related, she has been receiving retacrit injections for anemia through our office.      Lab trends:  07/19/2018 Cr 2.26 with a GFR of 19.   07/15/18 - Creatinine 2.15 with a potassium of 5.1  04/2018 - Cr 2.13  08/2016 - Cr 1.8 08/2016 with a GFR of 25  (also note Hemoglobin was 7.9 on 11/24/2018)   Intake/Output Summary (Last 24 hours) at 12/04/2018 1503 Last data filed at 12/04/2018 0835 Gross per 24 hour  Intake 335.47 ml  Output -  Net 335.47 ml    Vitals:  Vitals:   12/03/18 2025 12/04/18 0021 12/04/18 0757 12/04/18 1455  BP: (!) 135/48 (!) 139/45 (!) 146/42 (!) 123/53  Pulse: 68 73 64 73  Resp: 16 12 18 16   Temp: 98.2 F (36.8 C) 98.3 F (36.8 C) 98.2 F (36.8 C) 97.9 F (36.6 C)  TempSrc: Oral Oral Oral Oral  SpO2: 94% 98% 96% 97%  Weight:      Height:         Physical Exam:  General elderly female in bed in no acute distress HEENT normocephalic atraumatic extraocular movements intact  sclera anicteric Neck supple trachea midline Lungs clear to auscultation bilaterally normal work of breathing at rest  Heart regular rate and rhythm no rubs or gallops appreciated Abdomen soft nontender nondistended Extremities no edema  Psych normal mood and affect   Medications reviewed   Labs:  BMP Latest Ref Rng & Units 12/04/2018 12/03/2018 12/02/2018  Glucose 70 - 99 mg/dL 81 86 88  BUN 8 - 23 mg/dL 34(H) 36(H) 34(H)  Creatinine 0.44 - 1.00 mg/dL 2.72(H) 2.66(H) 2.44(H)  Sodium 135 - 145 mmol/L 134(L) 132(L) 130(L)  Potassium 3.5 - 5.1 mmol/L 3.9 4.2 5.4(H)  Chloride 98 - 111 mmol/L 103 102 101  CO2 22 - 32 mmol/L 18(L) 18(L) 19(L)  Calcium 8.9 - 10.3 mg/dL 8.1(L) 8.2(L) 8.5(L)     Assessment/Plan:   # CKD stage IV  - Secondary to microvascular disease from HTN  - Baseline Cr around 2.2 - She is slightly higher than baseline 2/2 pre-renal insults.  Defer any lasix as euvolemic on exam  - For discharge planning, discussed with patient that given her CKD we would recommend no further injections of prolia  # Dyspnea - noted empiric coverage for PNA per primary team and s/p diuresis as below for CHF.  Flu negative.  Noted LLL opacity  favored to represent PNA per radiology  # Acute systolic and diastolic CHF  - would defer scheduled lasix at this time  - milrinone being discontinued - improved  # HTN  - Acceptable  - Would avoid ACE/ARB with history of hyperkalemia  # Anemia 2/2 CKD  - She is on an ESA as an outpatient through our office - Discussed with hospitalist.  Agree with PRBC's with refractory anemia  Claudia Desanctis, MD 12/04/2018 3:03 PM

## 2018-12-04 NOTE — Progress Notes (Signed)
Progress Notes       PROGRESS NOTE  Alicia Sellers KZS:[id number] DOB: November 22, 1929 DOA: 12/01/2018 PCP: Gaynelle Arabian, MD   LOS: 1 day   Brief Narrative / Interim history: 84 year old female with chronic kidney disease stage IV, hypertension, anxiety, hypothyroidism who was admitted on 12/26 with worsening dyspnea on exertion.  She was found to have evidence of fluid overload on the chest x-ray and was given Lasix x1 on admission.  She was also placed on antibiotics out of concern for pneumonia  Subjective: -feeling well, able to move in the room without significant dyspnea  Assessment & Plan: Active Problems:   Dyspnea   HTN (hypertension)   CKD (chronic kidney disease), stage IV (HCC)   Hypothyroidism   Anxiety   Anemia due to chronic kidney disease   Metabolic acidosis, normal anion gap (NAG)   Principal Problem Dyspnea on exertion and at rest, multifactorial concern for CHF versus pneumonia -Chest x-ray on admission with fluid overload with cardiomegaly, slight pulmonary vascular congestion elevated BNP -Has received IV Lasix admission, she appears euvolemic clinically and hold further diuresis -Chest x-ray was also concerning for pneumonia, she did have a cough but no fever or chills.  Started on ceftriaxone and azithromycin, continue, will narrow on discharge -Clinically improving  Additional Problems Acute combined systolic and diastolic CHF -2D echo done 12/27 showed an EF of 40-45% with global hypokinesis, grade 3 diastolic dysfunction.  This is new, she has no history of CHF, -Cardiology was consulted on 12/28, to augment hemodynamics cardiology recommended milrinone, this is to be discontinued today per Dr. Doylene Canard -He recommends medical management rather than aggressive work-up  Hypertension -Continue Norvasc, hydralazine, metoprolol.  Blood pressure stable  Hypothyroidism -Continue levothyroxine  Acute kidney injury on chronic kidney disease stage  IV -Given presentation with fluid overload, hyperkalemia yesterday, I have consulted nephrology, appreciate input.  -Discussed with Dr. Royce Macadamia, she is higher than her baseline of around 2.1-2.2.  Creatinine continues to climb today and is 2.7.  She will received 1 unit of packed red blood cells for persistent anemia, may help.  Repeat renal function tomorrow morning  Anemia of chronic disease -Likely in the setting of chronic kidney disease, continue oral iron, no bleeding.  Downtrending of her hemoglobin, 7.4 this morning.  Will transfuse 1 unit of packed red blood cells.  Fecal occult is negative  Anxiety -Continue PRN lorazepam  Scheduled Meds: . sodium chloride   Intravenous Once  . allopurinol  100 mg Oral Daily  . amLODipine  5 mg Oral Daily  . aspirin  81 mg Oral Q2000  . citalopram  10 mg Oral Daily  . ferrous sulfate  325 mg Oral Daily  . gemfibrozil  600 mg Oral BID AC  . heparin  5,000 Units Subcutaneous Q8H  . hydrALAZINE  25 mg Oral TID  . levothyroxine  50 mcg Oral Q0600  . metoprolol succinate  25 mg Oral Q2000   Continuous Infusions: . azithromycin 500 mg (12/04/18 1337)  . cefTRIAXone (ROCEPHIN)  IV 1 g (12/04/18 1301)   PRN Meds:.LORazepam, meclizine, ondansetron **OR** ondansetron (ZOFRAN) IV, senna-docusate, traMADol  DVT prophylaxis: heparin Code Status: Full code Family Communication: Daughter present at bedside Disposition Plan: home when ready   Consultants:   None   Procedures:   2D echo Impressions: - LVEF 40-45%, global hypokinesis, moderate LVH, grade 3 DD, very high LV filling pressure, aortic sclerosis, trivial AI, mild to moderate MR, severe LAE, mild RVE with mild systolic  dysfunction, moderate TR, RVSP 56 mmHg, dilated IVC.  Antimicrobials:  Ceftriaxone / Azithromycin 12/26 >> day 4/5  Objective: Vitals:   12/03/18 2025 12/04/18 0021 12/04/18 0757 12/04/18 1455  BP: (!) 135/48 (!) 139/45 (!) 146/42 (!) 123/53  Pulse: 68 73 64 73   Resp: 16 12 18 16   Temp: 98.2 F (36.8 C) 98.3 F (36.8 C) 98.2 F (36.8 C) 97.9 F (36.6 C)  TempSrc: Oral Oral Oral Oral  SpO2: 94% 98% 96% 97%  Weight:      Height:        Intake/Output Summary (Last 24 hours) at 12/04/2018 1506 Last data filed at 12/04/2018 0938 Gross per 24 hour  Intake 335.47 ml  Output -  Net 335.47 ml   Filed Weights   12/01/18 1400 12/02/18 0538 12/03/18 0500  Weight: 73.9 kg 58.3 kg 60.4 kg    Examination:  Constitutional: NAD Eyes: no icterus  ENMT: mmm Respiratory: Clear bilaterally without wheezing or crackles, good air movement Cardiovascular: Regular rate and rhythm, no peripheral edema Abdomen: Soft, nontender, nondistended, bowel sounds positive Musculoskeletal: no clubbing / cyanosis. Skin: No rashes seen Neurologic: No focal deficits   Data Reviewed: I have independently reviewed following labs and imaging studies   CBC: Recent Labs  Lab 12/01/18 1014 12/02/18 0225 12/03/18 0304 12/04/18 0241  WBC 4.4 3.3* 2.6* 3.5*  NEUTROABS 3.6  --   --   --   HGB 9.4* 7.7* 7.5* 7.4*  HCT 28.5* 22.9* 23.1* 22.3*  MCV 98.6 97.4 95.5 95.7  PLT 314 283 262 182   Basic Metabolic Panel: Recent Labs  Lab 12/01/18 1014 12/02/18 0225 12/03/18 0304 12/04/18 0241  NA 130* 130* 132* 134*  K 4.2 5.4* 4.2 3.9  CL 102 101 102 103  CO2 15* 19* 18* 18*  GLUCOSE 135* 88 86 81  BUN 34* 34* 36* 34*  CREATININE 2.25* 2.44* 2.66* 2.72*  CALCIUM 8.7* 8.5* 8.2* 8.1*   GFR: Estimated Creatinine Clearance: 12.1 mL/min (A) (by C-G formula based on SCr of 2.72 mg/dL (H)). Liver Function Tests: Recent Labs  Lab 12/01/18 1014  AST 24  ALT 19  ALKPHOS 88  BILITOT 1.4*  PROT 6.8  ALBUMIN 3.4*   No results for input(s): LIPASE, AMYLASE in the last 168 hours. No results for input(s): AMMONIA in the last 168 hours. Coagulation Profile: No results for input(s): INR, PROTIME in the last 168 hours. Cardiac Enzymes: Recent Labs  Lab  12/01/18 1014  TROPONINI <0.03   BNP (last 3 results) No results for input(s): PROBNP in the last 8760 hours. HbA1C: No results for input(s): HGBA1C in the last 72 hours. CBG: Recent Labs  Lab 12/02/18 0750 12/03/18 0801 12/04/18 0751  GLUCAP 84 83 84   Lipid Profile: No results for input(s): CHOL, HDL, LDLCALC, TRIG, CHOLHDL, LDLDIRECT in the last 72 hours. Thyroid Function Tests: No results for input(s): TSH, T4TOTAL, FREET4, T3FREE, THYROIDAB in the last 72 hours. Anemia Panel: No results for input(s): VITAMINB12, FOLATE, FERRITIN, TIBC, IRON, RETICCTPCT in the last 72 hours. Urine analysis: No results found for: COLORURINE, APPEARANCEUR, LABSPEC, PHURINE, GLUCOSEU, HGBUR, BILIRUBINUR, KETONESUR, PROTEINUR, UROBILINOGEN, NITRITE, LEUKOCYTESUR Sepsis Labs: Invalid input(s): PROCALCITONIN, LACTICIDVEN  Recent Results (from the past 240 hour(s))  Culture, blood (routine x 2)     Status: None (Preliminary result)   Collection Time: 12/01/18  3:00 PM  Result Value Ref Range Status   Specimen Description BLOOD RIGHT ANTECUBITAL  Final   Special Requests   Final  BOTTLES DRAWN AEROBIC ONLY Blood Culture adequate volume   Culture   Final    NO GROWTH 3 DAYS Performed at [facility], [place] [address] [place], [place] [address]    Report Status PENDING  Incomplete  MRSA PCR Screening     Status: None   Collection Time: 12/01/18  3:00 PM  Result Value Ref Range Status   MRSA by PCR NEGATIVE NEGATIVE Final    Comment:        The GeneXpert MRSA Assay (FDA approved for NASAL specimens only), is one component of a comprehensive MRSA colonization surveillance program. It is not intended to diagnose MRSA infection nor to guide or monitor treatment for MRSA infections. Performed at [facility], [place] [address] [place], [place] [address]   Culture, blood (routine x 2)     Status: None (Preliminary result)   Collection Time: 12/01/18  3:09 PM  Result Value Ref  Range Status   Specimen Description BLOOD LEFT ANTECUBITAL  Final   Special Requests   Final    BOTTLES DRAWN AEROBIC ONLY Blood Culture results may not be optimal due to an inadequate volume of blood received in culture bottles   Culture   Final    NO GROWTH 3 DAYS Performed at [facility], [place] [address] [place], [place] [address]    Report Status PENDING  Incomplete      Radiology Studies: No results found.   Marzetta Board, MD, PhD [facility] Pager [phone]  If 7PM-7AM, please contact night-coverage [email or web address] Password [email or web address] 12/04/2018, 3:06 PM

## 2018-12-04 NOTE — Consult Note (Signed)
Ref: Alicia Arabian, MD   Subjective:  Decreasing shortness of breath. Improving sodium level and BUN and Creatinine are holding with IV milrinone use. Appreciate renal consult.  Objective:  Vital Signs in the last 24 hours: Temp:  [98.2 F (36.8 C)-98.3 F (36.8 C)] 98.2 F (36.8 C) (12/29 0757) Pulse Rate:  [64-73] 64 (12/29 0757) Cardiac Rhythm: Heart block (12/29 0751) Resp:  [12-18] 18 (12/29 0757) BP: (135-149)/(42-81) 146/42 (12/29 0757) SpO2:  [94 %-99 %] 96 % (12/29 0757)  Physical Exam: BP Readings from Last 1 Encounters:  12/04/18 (!) 146/42     Wt Readings from Last 1 Encounters:  12/03/18 60.4 kg    Weight change:  Body mass index is 25.16 kg/m. HEENT: [place]/AT, Eyes-Blue, PERL, EOMI, Conjunctiva-Pale, Sclera-Non-icteric Neck: No JVD, No bruit, Trachea midline. Lungs:  Clearing, Bilateral. Cardiac:  Regular rhythm, normal S1 and S2, no S3. II/VI systolic murmur. Abdomen:  Soft, non-tender. BS present. Extremities:  No edema present. No cyanosis. No clubbing. CNS: AxOx3, Cranial nerves grossly intact, moves all 4 extremities.  Skin: Warm and dry.   Intake/Output from previous day: 12/28 0701 - 12/29 0700 In: 575.5 [P.O.:240; IV Piggyback:335.5] Out: 400 [Urine:400]    Lab Results: BMET    Component Value Date/Time   NA 134 (L) 12/04/2018 0241   NA 132 (L) 12/03/2018 0304   NA 130 (L) 12/02/2018 0225   K 3.9 12/04/2018 0241   K 4.2 12/03/2018 0304   K 5.4 (H) 12/02/2018 0225   CL 103 12/04/2018 0241   CL 102 12/03/2018 0304   CL 101 12/02/2018 0225   CO2 18 (L) 12/04/2018 0241   CO2 18 (L) 12/03/2018 0304   CO2 19 (L) 12/02/2018 0225   GLUCOSE 81 12/04/2018 0241   GLUCOSE 86 12/03/2018 0304   GLUCOSE 88 12/02/2018 0225   BUN 34 (H) 12/04/2018 0241   BUN 36 (H) 12/03/2018 0304   BUN 34 (H) 12/02/2018 0225   CREATININE 2.72 (H) 12/04/2018 0241   CREATININE 2.66 (H) 12/03/2018 0304   CREATININE 2.44 (H) 12/02/2018 0225   CALCIUM 8.1 (L)  12/04/2018 0241   CALCIUM 8.2 (L) 12/03/2018 0304   CALCIUM 8.5 (L) 12/02/2018 0225   GFRNONAA 15 (L) 12/04/2018 0241   GFRNONAA 16 (L) 12/03/2018 0304   GFRNONAA 17 (L) 12/02/2018 0225   GFRAA 17 (L) 12/04/2018 0241   GFRAA 18 (L) 12/03/2018 0304   GFRAA 20 (L) 12/02/2018 0225   CBC    Component Value Date/Time   WBC 3.5 (L) 12/04/2018 0241   RBC 2.33 (L) 12/04/2018 0241   HGB 7.4 (L) 12/04/2018 0241   HCT 22.3 (L) 12/04/2018 0241   PLT 263 12/04/2018 0241   MCV 95.7 12/04/2018 0241   MCH 31.8 12/04/2018 0241   MCHC 33.2 12/04/2018 0241   RDW 14.8 12/04/2018 0241   LYMPHSABS 0.5 (L) 12/01/2018 1014   MONOABS 0.3 12/01/2018 1014   EOSABS 0.0 12/01/2018 1014   BASOSABS 0.0 12/01/2018 1014   HEPATIC Function Panel Recent Labs    12/01/18 1014  PROT 6.8   HEMOGLOBIN A1C No components found for: HGA1C,  MPG CARDIAC ENZYMES Lab Results  Component Value Date   TROPONINI <0.03 12/01/2018   BNP No results for input(s): PROBNP in the last 8760 hours. TSH Recent Labs    12/01/18 1501  TSH 2.769   CHOLESTEROL No results for input(s): CHOL in the last 8760 hours.  Scheduled Meds: . allopurinol  100 mg Oral Daily  .  amLODipine  5 mg Oral Daily  . aspirin  81 mg Oral Q2000  . citalopram  10 mg Oral Daily  . ferrous sulfate  325 mg Oral Daily  . gemfibrozil  600 mg Oral BID AC  . heparin  5,000 Units Subcutaneous Q8H  . hydrALAZINE  25 mg Oral TID  . levothyroxine  50 mcg Oral Q0600  . metoprolol succinate  25 mg Oral Q2000   Continuous Infusions: . azithromycin 500 mg (12/03/18 1311)  . cefTRIAXone (ROCEPHIN)  IV 1 g (12/03/18 1441)  . milrinone 0.125 mcg/kg/min (12/03/18 1541)   PRN Meds:.LORazepam, meclizine, ondansetron **OR** ondansetron (ZOFRAN) IV, senna-docusate, traMADol  Assessment/Plan: Acute systolic left heart failure, HFrEF HTN CKD, IV Hypothyroidism Anemia of chronic disease Possible pneumonia  May discontinue milrinone. Discussed daily  weight and reduced salt and fluid intake. PRN lasix use is OK. Increase activity. F/U in 1 week.   LOS: 1 day    [name]  MD  12/04/2018, 11:09 AM

## 2018-12-05 DIAGNOSIS — E872 Acidosis: Secondary | ICD-10-CM

## 2018-12-05 LAB — TYPE AND SCREEN
ABO/RH(D): O NEG
Antibody Screen: NEGATIVE
Unit division: 0

## 2018-12-05 LAB — CBC
HCT: 27 % — ABNORMAL LOW (ref 36.0–46.0)
Hemoglobin: 9 g/dL — ABNORMAL LOW (ref 12.0–15.0)
MCH: 30.6 pg (ref 26.0–34.0)
MCHC: 33.3 g/dL (ref 30.0–36.0)
MCV: 91.8 fL (ref 80.0–100.0)
Platelets: 265 10*3/uL (ref 150–400)
RBC: 2.94 MIL/uL — ABNORMAL LOW (ref 3.87–5.11)
RDW: 17 % — ABNORMAL HIGH (ref 11.5–15.5)
WBC: 3.9 10*3/uL — ABNORMAL LOW (ref 4.0–10.5)
nRBC: 0 % (ref 0.0–0.2)

## 2018-12-05 LAB — BASIC METABOLIC PANEL
Anion gap: 12 (ref 5–15)
BUN: 34 mg/dL — ABNORMAL HIGH (ref 8–23)
CO2: 18 mmol/L — ABNORMAL LOW (ref 22–32)
Calcium: 8.5 mg/dL — ABNORMAL LOW (ref 8.9–10.3)
Chloride: 106 mmol/L (ref 98–111)
Creatinine, Ser: 2.65 mg/dL — ABNORMAL HIGH (ref 0.44–1.00)
GFR calc Af Amer: 18 mL/min — ABNORMAL LOW (ref >60)
GFR calc non Af Amer: 16 mL/min — ABNORMAL LOW (ref >60)
Glucose, Bld: 84 mg/dL (ref 70–99)
Potassium: 4.3 mmol/L (ref 3.5–5.1)
Sodium: 136 mmol/L (ref 135–145)

## 2018-12-05 LAB — BPAM RBC
Blood Product Expiration Date: 202001102359
ISSUE DATE / TIME: 201912291505
Unit Type and Rh: 9500

## 2018-12-05 LAB — GLUCOSE, CAPILLARY: Glucose-Capillary: 80 mg/dL (ref 70–99)

## 2018-12-05 MED ORDER — AZITHROMYCIN 250 MG PO TABS
500.0000 mg | ORAL_TABLET | Freq: Every day | ORAL | Status: DC
  Administered 2018-12-05: 500 mg via ORAL
  Filled 2018-12-05: qty 2

## 2018-12-05 MED ORDER — FISH OIL 1000 MG PO CAPS: 1.0000 | ORAL_CAPSULE | Freq: Two times a day (BID) | ORAL | 0 refills | Status: DC

## 2018-12-05 NOTE — Discharge Summary (Signed)
Physician Discharge Summary  Alicia Sellers QMG:[id number] DOB: Aug 24, 1929 DOA: 12/01/2018  PCP: Gaynelle Arabian, MD  Admit date: 12/01/2018 Discharge date: 12/05/2018  Admitted From: Home Disposition: Home  Recommendations for Outpatient Follow-up:  1. Follow up with PCP in 1-2 weeks 2. Please obtain BMP/CBC in one week 3. Please follow up on the following pending results:  Home Health: None Equipment/Devices: None  Discharge Condition: Stable CODE STATUS: Full code Diet recommendation: Heart healthy, low-salt  HPI: Per Dr. [name] is a 84 y.o. female with medical history significant for CKD, Stage 4, HTN, anxiety, hypothyroidism who presents on 12/01/2018 with worsening dyspnea on exertion that has progressed to dyspnea at rest. Dry cough x 3 weeks. Worsening shortness of breath. Three weeks ago could walk from admissionsarea to treatment area ( for prokit) but now to do the same distance she needs a wheel chair.  Last two days got worse with worsening cough and now dyspnea with minimal exertion ( putting on her shoes or even sitting in chair) denies any sick contacts, recent illness.  Denies any PND, orthopnea or edema.   CKD. Dr. Edrick Oh is her nephrologist ([facility]). Last saw about 4 months ago. At that time she reports she was moved from stage 3 to stage 4. No problems with urination, no hematuria. HTN. Takes amlodipine and hydralazine adherently.  It typically runs 150s/50-60s.    Hospital Course:  Principal Problem Dyspnea on exertion and at rest, multifactorial concern for CHF versus pneumonia -Chest x-ray on admission with fluid overload with cardiomegaly, slight pulmonary vascular congestion elevated BNP. Has received IV Lasix admission with improvement in her volume status.  Her chest x-ray was also concern for pneumonia and she completed an antibiotic course while hospitalized.  She is clinically improved, feeling back to baseline, and will be  discharged home in stable condition  Additional Problems Acute combined systolic and diastolic CHF -2D echo done 12/27 showed an EF of 40-45% with global hypokinesis, grade 3 diastolic dysfunction.  This is new, she has no history of CHF. Cardiology, Dr. Doylene Canard, was consulted on 12/28, to augment hemodynamics cardiology placed patient on milrinone for 24 hours.  He recommends continued medical management rather than aggressive work-up Hypertension -Continue home regimen Hypothyroidism -Continue levothyroxine Acute kidney injury on chronic kidney disease stage IV -patient received IV Lasix and her creatinine continues to climb for couple of days afterwards, nephrology was consulted.  It appears that she may have a new baseline as she has progressed in the last several months as an outpatient as well.  Her creatinine has remained stable, plateaued, and started to improve on discharge.  She will follow-up with Dr. Justin Mend in 2 weeks as an outpatient Anemia of chronic kidney disease -Likely in the setting of chronic kidney disease, continue oral iron, no bleeding, fecal occult negative.  She was transfused 1 unit of packed red blood cells for hemoglobin of 7.4 which is lower than her baseline, and it improved to 9.  Continue to monitor periodically as an outpatient. Anxiety -Continue PRN lorazepam  Discharge Diagnoses:  Active Problems:   Dyspnea   HTN (hypertension)   CKD (chronic kidney disease), stage IV (HCC)   Hypothyroidism   Anxiety   Anemia due to chronic kidney disease   Metabolic acidosis, normal anion gap (NAG)     Discharge Instructions   Allergies as of 12/05/2018      Reactions   Latex Rash   Penicillins Rash   DID THE  REACTION INVOLVE: Swelling of the face/tongue/throat, SOB, or low BP? No Sudden or severe rash/hives, skin peeling, or the inside of the mouth or nose? No Did it require medical treatment? No When did it last happen? If all above answers are "NO", may  proceed with cephalosporin use.      Medication List    STOP taking these medications   denosumab 60 MG/ML Sosy injection Commonly known as:  PROLIA   Ketoprofen Powd     TAKE these medications   AMLODIPINE BENZOATE PO Take 5 mg by mouth daily.   aspirin 81 MG chewable tablet Chew 81 mg by mouth daily.   CALCIUM 500 + D 500-125 MG-UNIT Tabs Generic drug:  Calcium Carbonate-Vitamin D Take 1 tablet by mouth 2 (two) times daily. Taking 600-400   citalopram 10 MG tablet Commonly known as:  CELEXA Take 10 mg by mouth daily.   fexofenadine 180 MG tablet Commonly known as:  ALLEGRA Take 180 mg by mouth daily.   Fish Oil 1000 MG Caps Take 1 capsule (1,000 mg total) by mouth 2 (two) times daily with a meal. What changed:  how much to take   gemfibrozil 600 MG tablet Commonly known as:  LOPID Take 600 mg by mouth 2 (two) times daily before a meal.   hydrALAZINE 25 MG tablet Commonly known as:  APRESOLINE Take 25 mg by mouth 3 (three) times daily.   IRON COMPLEX Caps Take 1 capsule by mouth daily.   levothyroxine 50 MCG tablet Commonly known as:  SYNTHROID, LEVOTHROID Take 50 mcg by mouth daily before breakfast.   LORazepam 0.5 MG tablet Commonly known as:  ATIVAN Take 0.5 mg by mouth 2 (two) times daily.   meclizine 25 MG tablet Commonly known as:  ANTIVERT Take 25 mg by mouth 3 (three) times daily as needed for dizziness.   metaxalone 800 MG tablet Commonly known as:  SKELAXIN Take 800 mg by mouth 2 (two) times daily.   metoprolol succinate 25 MG 24 hr tablet Commonly known as:  TOPROL-XL Take 25 mg by mouth daily.   MULTI COMPLETE Caps Take 1 tablet by mouth daily.   Red Yeast Rice 600 MG Tabs Take 600 tablets by mouth 2 (two) times daily.   RETACRIT 59563 UNIT/ML injection Generic drug:  epoetin alfa-epbx Inject 10,000 Units into the skin once a week.   traMADol 50 MG tablet Commonly known as:  ULTRAM Take by mouth every 6 (six) hours as  needed.   vitamin E 400 UNIT capsule Take 400 Units by mouth daily.      Follow-up Information    [name], MD. Schedule an appointment as soon as possible for a visit in 1 week(s).   Specialty:  Cardiology Contact information: [place] [address] [phone]           Consultations:  Nephrology  Cardiology  Procedures/Studies:  2D echo  Impressions: - LVEF 40-45%, global hypokinesis, moderate LVH, grade 3 DD, very high LV filling pressure, aortic sclerosis, trivial AI, mild to moderate MR, severe LAE, mild RVE with mild systolic dysfunction, moderate TR, RVSP 56 mmHg, dilated IVC.   Dg Chest 2 View  Result Date: 12/01/2018 CLINICAL DATA:  Short of breath, cough for 2 days EXAM: CHEST - 2 VIEW COMPARISON:  Chest x-ray 03/24/2007 FINDINGS: There is opacity at the left lung base posteriorly most consistent with patchy left lower lobe pneumonia and small left pleural effusion. A small right pleural effusion and be  excluded. There is cardiomegaly present and there may be superimposed mild pulmonary vascular congestion. Curvature of the thoracic spine is unchanged. IMPRESSION: 1. Left lower lobe opacity most consistent with pneumonia and small left pleural effusion. 2. Cardiomegaly. Can not exclude very mild pulmonary vascular congestion. Electronically Signed   By: Ivar Drape M.D.   On: 12/01/2018 11:00   Mm 3d Screen Breast Bilateral  Result Date: 11/15/2018 CLINICAL DATA:  Screening. EXAM: DIGITAL SCREENING BILATERAL MAMMOGRAM WITH TOMO AND CAD COMPARISON:  Previous exam(s). ACR Breast Density Category b: There are scattered areas of fibroglandular density. FINDINGS: There are no findings suspicious for malignancy. Images were processed with CAD. IMPRESSION: No mammographic evidence of malignancy. A result letter of this screening mammogram will be mailed directly to the patient. RECOMMENDATION: Screening mammogram in one year. (Code:SM-B-01Y)  BI-RADS CATEGORY  1: Negative. Electronically Signed   By: Abelardo Diesel M.D.   On: 11/15/2018 16:18      Subjective: - no chest pain, shortness of breath, no abdominal pain, nausea or vomiting.   Discharge Exam: Vitals:   12/04/18 2109 12/05/18 0755  BP: (!) 140/49 (!) 147/62  Pulse: 74 74  Resp:  18  Temp:  98.8 F (37.1 C)  SpO2:  94%    General: Pt is alert, awake, not in acute distress Cardiovascular: RRR, S1/S2 +, no rubs, no gallops Respiratory: CTA bilaterally, no wheezing, no rhonchi Abdominal: Soft, NT, ND, bowel sounds + Extremities: no edema, no cyanosis    The results of significant diagnostics from this hospitalization (including imaging, microbiology, ancillary and laboratory) are listed below for reference.     Microbiology: Recent Results (from the past 240 hour(s))  Culture, blood (routine x 2)     Status: None (Preliminary result)   Collection Time: 12/01/18  3:00 PM  Result Value Ref Range Status   Specimen Description BLOOD RIGHT ANTECUBITAL  Final   Special Requests   Final    BOTTLES DRAWN AEROBIC ONLY Blood Culture adequate volume   Culture   Final    NO GROWTH 3 DAYS Performed at [facility], [address], [place], [place] [address]    Report Status PENDING  Incomplete  MRSA PCR Screening     Status: None   Collection Time: 12/01/18  3:00 PM  Result Value Ref Range Status   MRSA by PCR NEGATIVE NEGATIVE Final    Comment:        The GeneXpert MRSA Assay (FDA approved for NASAL specimens only), is one component of a comprehensive MRSA colonization surveillance program. It is not intended to diagnose MRSA infection nor to guide or monitor treatment for MRSA infections. Performed at [facility], [place] [address] [place], [place] [address]   Culture, blood (routine x 2)     Status: None (Preliminary result)   Collection Time: 12/01/18  3:09 PM  Result Value Ref Range Status   Specimen Description BLOOD LEFT ANTECUBITAL   Final   Special Requests   Final    BOTTLES DRAWN AEROBIC ONLY Blood Culture results may not be optimal due to an inadequate volume of blood received in culture bottles   Culture   Final    NO GROWTH 3 DAYS Performed at [facility], [place] [address], [place], [place] [address]    Report Status PENDING  Incomplete     Labs: BNP (last 3 results) Recent Labs    12/01/18 1014  BNP 3,354.5*   Basic Metabolic Panel: Recent  Labs  Lab 12/01/18 1014 12/02/18 0225 12/03/18 0304 12/04/18 0241 12/05/18 0251  NA 130* 130* 132* 134* 136  K 4.2 5.4* 4.2 3.9 4.3  CL 102 101 102 103 106  CO2 15* 19* 18* 18* 18*  GLUCOSE 135* 88 86 81 84  BUN 34* 34* 36* 34* 34*  CREATININE 2.25* 2.44* 2.66* 2.72* 2.65*  CALCIUM 8.7* 8.5* 8.2* 8.1* 8.5*   Liver Function Tests: Recent Labs  Lab 12/01/18 1014  AST 24  ALT 19  ALKPHOS 88  BILITOT 1.4*  PROT 6.8  ALBUMIN 3.4*   No results for input(s): LIPASE, AMYLASE in the last 168 hours. No results for input(s): AMMONIA in the last 168 hours. CBC: Recent Labs  Lab 12/01/18 1014 12/02/18 0225 12/03/18 0304 12/04/18 0241 12/05/18 0627  WBC 4.4 3.3* 2.6* 3.5* 3.9*  NEUTROABS 3.6  --   --   --   --   HGB 9.4* 7.7* 7.5* 7.4* 9.0*  HCT 28.5* 22.9* 23.1* 22.3* 27.0*  MCV 98.6 97.4 95.5 95.7 91.8  PLT 314 283 262 263 265   Cardiac Enzymes: Recent Labs  Lab 12/01/18 1014  TROPONINI <0.03   BNP: Invalid input(s): POCBNP CBG: Recent Labs  Lab 12/02/18 0750 12/03/18 0801 12/04/18 0751 12/05/18 0725  GLUCAP 84 83 84 80   D-Dimer No results for input(s): DDIMER in the last 72 hours. Hgb A1c No results for input(s): HGBA1C in the last 72 hours. Lipid Profile No results for input(s): CHOL, HDL, LDLCALC, TRIG, CHOLHDL, LDLDIRECT in the last 72 hours. Thyroid function studies No results for input(s): TSH, T4TOTAL, T3FREE, THYROIDAB in the last 72 hours.  Invalid input(s): FREET3 Anemia work up No results for input(s):  VITAMINB12, FOLATE, FERRITIN, TIBC, IRON, RETICCTPCT in the last 72 hours. Urinalysis No results found for: COLORURINE, APPEARANCEUR, LABSPEC, [place], GLUCOSEU, HGBUR, BILIRUBINUR, KETONESUR, PROTEINUR, UROBILINOGEN, NITRITE, LEUKOCYTESUR Sepsis Labs Invalid input(s): PROCALCITONIN,  WBC,  LACTICIDVEN   Time coordinating discharge: 35 minutes  SIGNED:  Marzetta Board, MD  [facility] 12/05/2018, 1:38 PM Pager [phone]  If 7PM-7AM, please contact night-coverage [email or web address] Password [email or web address]

## 2018-12-05 NOTE — Progress Notes (Signed)
[facility] Progress Note  Name: Alicia Sellers MRN: [id number] DOB: 12-25-1928   Subjective:  Spoke with team - they are hoping to send her home today.  Pt with 1.1 liters UOP over 12/29.  She states that she has an appt with Dr. Justin Mend coming up on 1/24.  She confirms not on a diuretic at home.  Review of systems:  Reports shortness of breath improved Denies chest pain Denies n/v  ------------------- Background on consult:  Alicia Sellers is a 84 y.o. female with a history of CKD and HTN who presented with dyspnea on exertion and fluid overload.  She received lasix IV x 2 doses and creatinine has increased from 2.25 to 2.66 at time of consult.  Cardiology has been consulted and note the addition of milrinone.  On review of clinic records, the patient follows with Dr. Justin Mend and has known CKD stage IV.  She was last seen in August 2019 in our office and she was referred for treatment options/CKD education class at that time.  Also CKD related, she has been receiving retacrit injections for anemia through our office.      Lab trends:  07/19/2018 Cr 2.26 with a GFR of 19.   07/15/18 - Creatinine 2.15 with a potassium of 5.1  04/2018 - Cr 2.13  08/2016 - Cr 1.8 08/2016 with a GFR of 25  (also note Hemoglobin was 7.9 on 11/24/2018)   Intake/Output Summary (Last 24 hours) at 12/05/2018 0905 Last data filed at 12/05/2018 0500 Gross per 24 hour  Intake 1165.67 ml  Output 1100 ml  Net 65.67 ml    Vitals:  Vitals:   12/04/18 1801 12/04/18 2109 12/05/18 0536 12/05/18 0755  BP: (!) 170/60 (!) 140/49  (!) 147/62  Pulse: 80 74  74  Resp: 18   18  Temp: 98 F (36.7 C)   98.8 F (37.1 C)  TempSrc: Oral   Oral  SpO2: 98%   94%  Weight:   58.6 kg   Height:         Physical Exam:  General adult female in bed in no acute distress HEENT normocephalic atraumatic extraocular movements intact sclera anicteric Neck supple trachea midline Lungs clear to auscultation  bilaterally normal work of breathing at rest  Heart regular rate and rhythm no rubs or gallops appreciated Abdomen soft nontender nondistended Extremities no edema  Psych normal mood and affect   Medications reviewed   Labs:  BMP Latest Ref Rng & Units 12/05/2018 12/04/2018 12/03/2018  Glucose 70 - 99 mg/dL 84 81 86  BUN 8 - 23 mg/dL 34(H) 34(H) 36(H)  Creatinine 0.44 - 1.00 mg/dL 2.65(H) 2.72(H) 2.66(H)  Sodium 135 - 145 mmol/L 136 134(L) 132(L)  Potassium 3.5 - 5.1 mmol/L 4.3 3.9 4.2  Chloride 98 - 111 mmol/L 106 103 102  CO2 22 - 32 mmol/L 18(L) 18(L) 18(L)  Calcium 8.9 - 10.3 mg/dL 8.5(L) 8.1(L) 8.2(L)     Assessment/Plan:   # CKD stage IV  - Secondary to microvascular disease from HTN  - Baseline Cr around 2.2.  She appears to have plateaued.  Improving anemia may help her CKD as well  - Continue supportive care - defer scheduled diuretics (she confirms not on these prior to admission) - For discharge planning, discussed with patient that given her CKD we would recommend no further injections of prolia.  She voiced understanding  # Dyspnea - noted empiric coverage for PNA per primary team and s/p diuresis as  below for CHF.  Flu negative.  Noted LLL opacity favored to represent PNA per radiology  # Acute systolic and diastolic CHF  - would defer scheduled lasix at this time  - improved  # HTN  - Acceptable  - Would avoid ACE/ARB with history of hyperkalemia  # Anemia 2/2 CKD  - improving s/p PRBC's - She is on an ESA as an outpatient through our office  Stable for discharge from a renal standpoint.  Follow-up with Dr. Justin Mend as scheduled.   Claudia Desanctis, MD 12/05/2018 9:05 AM

## 2018-12-05 NOTE — Progress Notes (Signed)
Discharge instructions reviewed with pt and family. D/c instructions on CHF reviewed and handouts provided. Pt instructed on daily weights and when to call MD, or cardiologist. Pt and daughter verbalized understanding.  Copy of instructions given to pt.    At 1245    Pt d/c'd via wheelchair with belongings, with family.         Escorted by hospital volunteer.

## 2018-12-05 NOTE — Discharge Instructions (Signed)
CHF  Discharge instructions  DIET  Low sodium Heart Healthy Diet with fluid restriction 1500cc/day  ACTIVITY  Avoid strenuous activity  WEIGH DAILY AT SAME TIME . CALL YOUR DOCTOR IF WEIGHT INCREASES BY MORE THAN 3  POUNDS IN 2 DAYS  CALL YOUR DOCTOR OR COME TO EMERGENCY ROOM IF WORSENING SHORTNESS OF BREATH OR  CHEST PAIN OR SWELLING  F/U with PMD in 1 week to recheck labs and adjust fluid pills as needed  If you smoke cigarettes or use any tobacco products you are advised to stop. Please ask nurse for any written materials  Or additional information you want regarding smoking cessation  Follow with Gaynelle Arabian, MD in 1-2 weeks  Please get a complete blood count and chemistry panel checked by your Primary MD at your next visit, and again as instructed by your Primary MD. Please get your medications reviewed and adjusted by your Primary MD.  Please request your Primary MD to go over all Hospital Tests and Procedure/Radiological results at the follow up, please get all Hospital records sent to your Prim MD by signing hospital release before you go home.  If you had Pneumonia of Lung problems at the Hospital: Please get a 2 view Chest X ray done in 6-8 weeks after hospital discharge or sooner if instructed by your Primary MD.  If you have Congestive Heart Failure: Please call your Cardiologist or Primary MD anytime you have any of the following symptoms:  1) 3 pound weight gain in 24 hours or 5 pounds in 1 week  2) shortness of breath, with or without a dry hacking cough  3) swelling in the hands, feet or stomach  4) if you have to sleep on extra pillows at night in order to breathe  Follow cardiac low salt diet and 1.5 lit/day fluid restriction.  If you have diabetes Accuchecks 4 times/day, Once in AM empty stomach and then before each meal. Log in all results and show them to your primary doctor at your next visit. If any glucose reading is under 80 or above 300 call your  primary MD immediately.  If you have Seizure/Convulsions/Epilepsy: Please do not drive, operate heavy machinery, participate in activities at heights or participate in high speed sports until you have seen by Primary MD or a Neurologist and advised to do so again.  If you had Gastrointestinal Bleeding: Please ask your Primary MD to check a complete blood count within one week of discharge or at your next visit. Your endoscopic/colonoscopic biopsies that are pending at the time of discharge, will also need to followed by your Primary MD.  Get Medicines reviewed and adjusted. Please take all your medications with you for your next visit with your Primary MD  Please request your Primary MD to go over all hospital tests and procedure/radiological results at the follow up, please ask your Primary MD to get all Hospital records sent to his/her office.  If you experience worsening of your admission symptoms, develop shortness of breath, life threatening emergency, suicidal or homicidal thoughts you must seek medical attention immediately by calling 911 or calling your MD immediately  if symptoms less severe.  You must read complete instructions/literature along with all the possible adverse reactions/side effects for all the Medicines you take and that have been prescribed to you. Take any new Medicines after you have completely understood and accpet all the possible adverse reactions/side effects.   Do not drive or operate heavy machinery when taking Pain medications.  Do not take more than prescribed Pain, Sleep and Anxiety Medications  Special Instructions: If you have smoked or chewed Tobacco  in the last 2 yrs please stop smoking, stop any regular Alcohol  and or any Recreational drug use.  Wear Seat belts while driving.  Please note You were cared for by a hospitalist during your hospital stay. If you have any questions about your discharge medications or the care you received while you  were in the hospital after you are discharged, you can call the unit and asked to speak with the hospitalist on call if the hospitalist that took care of you is not available. Once you are discharged, your primary care physician will handle any further medical issues. Please note that NO REFILLS for any discharge medications will be authorized once you are discharged, as it is imperative that you return to your primary care physician (or establish a relationship with a primary care physician if you do not have one) for your aftercare needs so that they can reassess your need for medications and monitor your lab values.  You can reach the hospitalist office at phone [phone] or fax [phone]   If you do not have a primary care physician, you can call [phone] for a physician referral.  Activity: As tolerated with Full fall precautions use walker/cane & assistance as needed  Diet: low sodium  Disposition Home

## 2018-12-06 LAB — CULTURE, BLOOD (ROUTINE X 2)
Culture: NO GROWTH
Culture: NO GROWTH
Special Requests: ADEQUATE

## 2018-12-14 ENCOUNTER — Ambulatory Visit (HOSPITAL_COMMUNITY)
Admission: RE | Admit: 2018-12-14 | Discharge: 2018-12-14 | Disposition: A | Discharge status: Home / Self Care | Payer: Medicare HMO | Source: Ambulatory Visit | Attending: Nephrology | Admitting: Nephrology

## 2018-12-14 DIAGNOSIS — Z5181 Encounter for therapeutic drug level monitoring: Secondary | ICD-10-CM | POA: Insufficient documentation

## 2018-12-14 DIAGNOSIS — Z79899 Other long term (current) drug therapy: Secondary | ICD-10-CM | POA: Insufficient documentation

## 2018-12-14 DIAGNOSIS — N184 Chronic kidney disease, stage 4 (severe): Secondary | ICD-10-CM | POA: Diagnosis not present

## 2018-12-14 DIAGNOSIS — D631 Anemia in chronic kidney disease: Secondary | ICD-10-CM | POA: Diagnosis not present

## 2018-12-14 DIAGNOSIS — I10 Essential (primary) hypertension: Secondary | ICD-10-CM | POA: Diagnosis not present

## 2018-12-14 DIAGNOSIS — I2510 Atherosclerotic heart disease of native coronary artery without angina pectoris: Secondary | ICD-10-CM | POA: Diagnosis not present

## 2018-12-14 DIAGNOSIS — I5022 Chronic systolic (congestive) heart failure: Secondary | ICD-10-CM | POA: Diagnosis not present

## 2018-12-14 LAB — POCT HEMOGLOBIN-HEMACUE: Hemoglobin: 10.5 g/dL — ABNORMAL LOW (ref 12.0–15.0)

## 2018-12-14 LAB — IRON AND TIBC
Iron: 112 ug/dL (ref 28–170)
Saturation Ratios: 31 % (ref 10.4–31.8)
TIBC: 361 ug/dL (ref 250–450)
UIBC: 249 ug/dL

## 2018-12-14 LAB — FERRITIN: Ferritin: 95 ng/mL (ref 11–307)

## 2018-12-14 MED ORDER — EPOETIN ALFA-EPBX 3000 UNIT/ML IJ SOLN
3000.0000 [IU] | Freq: Once | INTRAMUSCULAR | Status: AC
  Administered 2018-12-14: 3000 [IU] via SUBCUTANEOUS
  Filled 2018-12-14: qty 1

## 2018-12-14 MED ORDER — EPOETIN ALFA-EPBX 2000 UNIT/ML IJ SOLN
2000.0000 [IU] | Freq: Once | INTRAMUSCULAR | Status: AC
  Administered 2018-12-14: 2000 [IU] via SUBCUTANEOUS
  Filled 2018-12-14: qty 1

## 2018-12-14 MED ORDER — EPOETIN ALFA-EPBX 10000 UNIT/ML IJ SOLN: 5000.0000 [IU] | Freq: Once | INTRAMUSCULAR | Status: DC

## 2018-12-20 ENCOUNTER — Other Ambulatory Visit: Payer: Self-pay

## 2018-12-20 ENCOUNTER — Other Ambulatory Visit: Payer: Self-pay | Admitting: *Deleted

## 2018-12-20 NOTE — Patient Outreach (Addendum)
[facility]) Care Management  12/20/2018  Alicia Sellers 09-01-1929 [id number]   Telephone Screen  Referral Date: 12/20/2018 Referral Source: [employer] Medicare Patient- [place]  Referral Reason: Please engage this [employer] patient for [place] [facility] Services.  Insurance: [employer]  Transition of care services noted to be completed by primary care MD office staff- Dr Marisue Humble, Lorenso Courier walk in clinic at [facility] college/ Family medicine at the [facility] of Care will be completed by primary care provider office who will refer to [facility] care management if needed.  CM and Alicia Sellers discussed her last hospital discharge and TOC questions asked and documented in EPIC No identified needs  CM and Alicia Sellers discussed CHF. She confirms she weighs daily, is on a 1500 fluid restriction and is aware of the HF action plan s/s to report to her MD like 3 - 5 lb wt gains, sob, swelling, hacky cough, dizziness, decrease energy, difficulty lying flat and chest discomfort   CM reviewed [facility] health coach program and Alicia Sellers agreed and voiced interest in health coach services. Discussed the no cost of [facility] services  Outreach attempt # 1 successful at her home number  Patient is able to verify HIPAA Reviewed and addressed referral to [facility] with patient  Social: Alicia Sellers is widowed and lives alone She is visited by her daughter and grand children. She states she is independent in her care and denies transportation needs to medical appointment     Conditions: CHF, anemia, HTN, anxiety, arthritis, CKD, dyspnea, hypothyroidism, hx of falls Reports falls x 2 this yr    Falls x 2 in last year the last fall was on Last Thursday 12/15/2018 while walking with her great  grand daughter. She reports she had walked about 1-2 blocks and reach down to pick up something as she was walking back in her driveway and lost her balance. She fell over on the concrete "on my face" and sustained  injury to her nose, face and knee She has used neosporin on the scrapes. She confirms having on athletic shoes but did not have her alert device on her  She broke her glasses She reports her scrapes are healing without swelling of any site. She denies dizziness prior to the fall but was "a little tired"  Cm discussed the importance of wearing the alert device, monitoring the sites for s/s of infection that CM reviewed with her and using a cane or stick when walking outside on uneven area   Medications: denies concerns with taking medications as prescribed, affording medications, side effects of medications and questions about medications   Appointments: to see Dr Marisue Humble the end of January 2020    DME BP scales eyeglasses   Advance Directives: She has a POA and a living will and is not interested in making changes    Consent: [facility] RN CM reviewed [facility] services with patient. Patient gave verbal consent for services from Telephonic RN CM,  health coach,prn    Plan: [facility] RN CM consulted with [place] coach about services  [facility] RN CM will refer Alicia Sellers to [facility] health coach to engage patient for services as agreed upon by Alicia Sellers for further education and management of CHF [facility] RN CM will close the telephonic case at this time.  Pt encouraged to return a call to [place] CM prn  [facility] RN CM sent a successful outreach letter as discussed with [name] brochure enclosed for review  [facility] RN CM faxed a letter to Dr Marisue Humble related to pt to be followed by [facility] health coach services  Kimberly L. Lavina Hamman, RN, BSN, [place] Coordinator Office number [phone] Mobile number [phone]  Main [facility] number [phone] Fax number [phone]

## 2018-12-21 ENCOUNTER — Ambulatory Visit (HOSPITAL_COMMUNITY)
Admission: RE | Admit: 2018-12-21 | Discharge: 2018-12-21 | Disposition: A | Discharge status: Home / Self Care | Payer: Medicare HMO | Source: Ambulatory Visit | Attending: Nephrology | Admitting: Nephrology

## 2018-12-21 DIAGNOSIS — N184 Chronic kidney disease, stage 4 (severe): Secondary | ICD-10-CM | POA: Diagnosis not present

## 2018-12-21 DIAGNOSIS — D631 Anemia in chronic kidney disease: Secondary | ICD-10-CM | POA: Insufficient documentation

## 2018-12-21 LAB — POCT HEMOGLOBIN-HEMACUE: Hemoglobin: 10.2 g/dL — ABNORMAL LOW (ref 12.0–15.0)

## 2018-12-21 MED ORDER — EPOETIN ALFA-EPBX 2000 UNIT/ML IJ SOLN
2000.0000 [IU] | Freq: Once | INTRAMUSCULAR | Status: AC
  Administered 2018-12-21: 2000 [IU] via SUBCUTANEOUS
  Filled 2018-12-21: qty 1

## 2018-12-21 MED ORDER — EPOETIN ALFA-EPBX 3000 UNIT/ML IJ SOLN
3000.0000 [IU] | Freq: Once | INTRAMUSCULAR | Status: AC
  Administered 2018-12-21: 3000 [IU] via SUBCUTANEOUS
  Filled 2018-12-21: qty 1

## 2018-12-21 MED ORDER — EPOETIN ALFA-EPBX 10000 UNIT/ML IJ SOLN: 5000.0000 [IU] | INTRAMUSCULAR | Status: DC

## 2018-12-28 ENCOUNTER — Ambulatory Visit (HOSPITAL_COMMUNITY)
Admission: RE | Admit: 2018-12-28 | Discharge: 2018-12-28 | Disposition: A | Discharge status: Home / Self Care | Payer: Medicare HMO | Source: Ambulatory Visit | Attending: Nephrology | Admitting: Nephrology

## 2018-12-28 VITALS — BP 148/48 | HR 52 | Resp 18

## 2018-12-28 DIAGNOSIS — N184 Chronic kidney disease, stage 4 (severe): Secondary | ICD-10-CM

## 2018-12-28 DIAGNOSIS — D631 Anemia in chronic kidney disease: Secondary | ICD-10-CM | POA: Diagnosis present

## 2018-12-28 LAB — POCT HEMOGLOBIN-HEMACUE: Hemoglobin: 9.8 g/dL — ABNORMAL LOW (ref 12.0–15.0)

## 2018-12-28 MED ORDER — EPOETIN ALFA-EPBX 10000 UNIT/ML IJ SOLN: 5000.0000 [IU] | INTRAMUSCULAR | Status: DC

## 2018-12-28 MED ORDER — EPOETIN ALFA-EPBX 3000 UNIT/ML IJ SOLN
3000.0000 [IU] | Freq: Once | INTRAMUSCULAR | Status: AC
  Administered 2018-12-28: 3000 [IU] via SUBCUTANEOUS
  Filled 2018-12-28: qty 1

## 2018-12-28 MED ORDER — EPOETIN ALFA-EPBX 2000 UNIT/ML IJ SOLN
2000.0000 [IU] | Freq: Once | INTRAMUSCULAR | Status: AC
  Administered 2018-12-28: 2000 [IU] via SUBCUTANEOUS
  Filled 2018-12-28: qty 1

## 2018-12-30 DIAGNOSIS — N184 Chronic kidney disease, stage 4 (severe): Secondary | ICD-10-CM | POA: Diagnosis not present

## 2018-12-30 DIAGNOSIS — D519 Vitamin B12 deficiency anemia, unspecified: Secondary | ICD-10-CM | POA: Diagnosis not present

## 2018-12-30 DIAGNOSIS — N189 Chronic kidney disease, unspecified: Secondary | ICD-10-CM | POA: Diagnosis not present

## 2018-12-30 DIAGNOSIS — D631 Anemia in chronic kidney disease: Secondary | ICD-10-CM | POA: Diagnosis not present

## 2018-12-30 DIAGNOSIS — I129 Hypertensive chronic kidney disease with stage 1 through stage 4 chronic kidney disease, or unspecified chronic kidney disease: Secondary | ICD-10-CM | POA: Diagnosis not present

## 2018-12-30 DIAGNOSIS — N2581 Secondary hyperparathyroidism of renal origin: Secondary | ICD-10-CM | POA: Diagnosis not present

## 2018-12-30 DIAGNOSIS — E785 Hyperlipidemia, unspecified: Secondary | ICD-10-CM | POA: Diagnosis not present

## 2018-12-30 DIAGNOSIS — E039 Hypothyroidism, unspecified: Secondary | ICD-10-CM | POA: Diagnosis not present

## 2019-01-04 ENCOUNTER — Ambulatory Visit (HOSPITAL_COMMUNITY)
Admission: RE | Admit: 2019-01-04 | Discharge: 2019-01-04 | Disposition: A | Discharge status: Home / Self Care | Payer: Medicare HMO | Source: Ambulatory Visit | Attending: Nephrology | Admitting: Nephrology

## 2019-01-04 VITALS — BP 160/59 | HR 53 | Temp 97.9°F | Resp 20

## 2019-01-04 DIAGNOSIS — N184 Chronic kidney disease, stage 4 (severe): Secondary | ICD-10-CM | POA: Diagnosis not present

## 2019-01-04 DIAGNOSIS — D631 Anemia in chronic kidney disease: Secondary | ICD-10-CM | POA: Diagnosis present

## 2019-01-04 LAB — POCT HEMOGLOBIN-HEMACUE: Hemoglobin: 10.3 g/dL — ABNORMAL LOW (ref 12.0–15.0)

## 2019-01-04 MED ORDER — EPOETIN ALFA-EPBX 2000 UNIT/ML IJ SOLN
2000.0000 [IU] | INTRAMUSCULAR | Status: DC
  Administered 2019-01-04: 2000 [IU] via SUBCUTANEOUS
  Filled 2019-01-04: qty 1

## 2019-01-04 MED ORDER — EPOETIN ALFA-EPBX 10000 UNIT/ML IJ SOLN: 5000.0000 [IU] | INTRAMUSCULAR | Status: DC

## 2019-01-04 MED ORDER — EPOETIN ALFA-EPBX 3000 UNIT/ML IJ SOLN
3000.0000 [IU] | INTRAMUSCULAR | Status: DC
  Administered 2019-01-04: 3000 [IU] via SUBCUTANEOUS
  Filled 2019-01-04: qty 1

## 2019-01-10 ENCOUNTER — Other Ambulatory Visit: Payer: Self-pay

## 2019-01-10 DIAGNOSIS — N184 Chronic kidney disease, stage 4 (severe): Secondary | ICD-10-CM | POA: Diagnosis not present

## 2019-01-10 DIAGNOSIS — E034 Atrophy of thyroid (acquired): Secondary | ICD-10-CM | POA: Diagnosis not present

## 2019-01-10 DIAGNOSIS — I10 Essential (primary) hypertension: Secondary | ICD-10-CM | POA: Diagnosis not present

## 2019-01-10 DIAGNOSIS — I5022 Chronic systolic (congestive) heart failure: Secondary | ICD-10-CM | POA: Diagnosis not present

## 2019-01-10 NOTE — Patient Outreach (Signed)
[facility]) Care Management  01/10/2019  Alicia Sellers Apr 19, 1929 [id number]    1st unsuccessful attempt to outreach the patient for initial assessment.  No answer.  HIPAA compliant voicemail left with contact information.  Plan: RN Health Coach will send letter. [name] will make outreach attempt the patient within thirty days.  [name] RN, BSN, [place] Direct Dial:  [phone]  Fax: [phone]

## 2019-01-11 ENCOUNTER — Ambulatory Visit (HOSPITAL_COMMUNITY)
Admission: RE | Admit: 2019-01-11 | Discharge: 2019-01-11 | Disposition: A | Discharge status: Home / Self Care | Payer: Medicare HMO | Source: Ambulatory Visit | Attending: Nephrology | Admitting: Nephrology

## 2019-01-11 VITALS — BP 168/55 | HR 53 | Temp 97.8°F | Resp 20

## 2019-01-11 DIAGNOSIS — D631 Anemia in chronic kidney disease: Secondary | ICD-10-CM

## 2019-01-11 DIAGNOSIS — N184 Chronic kidney disease, stage 4 (severe): Secondary | ICD-10-CM | POA: Diagnosis not present

## 2019-01-11 LAB — IRON AND TIBC
Iron: 62 ug/dL (ref 28–170)
Saturation Ratios: 19 % (ref 10.4–31.8)
TIBC: 322 ug/dL (ref 250–450)
UIBC: 260 ug/dL

## 2019-01-11 LAB — FERRITIN: Ferritin: 44 ng/mL (ref 11–307)

## 2019-01-11 LAB — POCT HEMOGLOBIN-HEMACUE: Hemoglobin: 10.3 g/dL — ABNORMAL LOW (ref 12.0–15.0)

## 2019-01-11 MED ORDER — EPOETIN ALFA-EPBX 3000 UNIT/ML IJ SOLN
3000.0000 [IU] | INTRAMUSCULAR | Status: DC
  Administered 2019-01-11: 3000 [IU] via SUBCUTANEOUS
  Filled 2019-01-11: qty 1

## 2019-01-11 MED ORDER — EPOETIN ALFA-EPBX 10000 UNIT/ML IJ SOLN
5000.0000 [IU] | INTRAMUSCULAR | Status: DC
  Filled 2019-01-11: qty 1

## 2019-01-11 MED ORDER — EPOETIN ALFA-EPBX 2000 UNIT/ML IJ SOLN
2000.0000 [IU] | INTRAMUSCULAR | Status: DC
  Administered 2019-01-11: 2000 [IU] via SUBCUTANEOUS
  Filled 2019-01-11: qty 1

## 2019-01-18 ENCOUNTER — Ambulatory Visit (HOSPITAL_COMMUNITY)
Admission: RE | Admit: 2019-01-18 | Discharge: 2019-01-18 | Disposition: A | Discharge status: Home / Self Care | Payer: Medicare HMO | Source: Ambulatory Visit | Attending: Nephrology | Admitting: Nephrology

## 2019-01-18 VITALS — BP 151/55 | HR 55 | Temp 97.7°F | Resp 18

## 2019-01-18 DIAGNOSIS — D631 Anemia in chronic kidney disease: Secondary | ICD-10-CM | POA: Insufficient documentation

## 2019-01-18 DIAGNOSIS — N184 Chronic kidney disease, stage 4 (severe): Secondary | ICD-10-CM | POA: Diagnosis not present

## 2019-01-18 MED ORDER — EPOETIN ALFA-EPBX 10000 UNIT/ML IJ SOLN: 5000.0000 [IU] | INTRAMUSCULAR | Status: DC

## 2019-01-18 MED ORDER — EPOETIN ALFA-EPBX 3000 UNIT/ML IJ SOLN
3000.0000 [IU] | Freq: Once | INTRAMUSCULAR | Status: AC
  Administered 2019-01-18: 3000 [IU] via SUBCUTANEOUS
  Filled 2019-01-18: qty 1

## 2019-01-18 MED ORDER — EPOETIN ALFA-EPBX 2000 UNIT/ML IJ SOLN
2000.0000 [IU] | Freq: Once | INTRAMUSCULAR | Status: AC
  Administered 2019-01-18: 2000 [IU] via SUBCUTANEOUS
  Filled 2019-01-18: qty 1

## 2019-01-19 LAB — POCT HEMOGLOBIN-HEMACUE: Hemoglobin: 10.3 g/dL — ABNORMAL LOW (ref 12.0–15.0)

## 2019-01-25 ENCOUNTER — Ambulatory Visit (HOSPITAL_COMMUNITY)
Admission: RE | Admit: 2019-01-25 | Discharge: 2019-01-25 | Disposition: A | Discharge status: Home / Self Care | Payer: Medicare HMO | Source: Ambulatory Visit | Attending: Nephrology | Admitting: Nephrology

## 2019-01-25 VITALS — BP 158/55 | HR 57 | Temp 98.1°F | Resp 20

## 2019-01-25 DIAGNOSIS — D631 Anemia in chronic kidney disease: Secondary | ICD-10-CM | POA: Insufficient documentation

## 2019-01-25 DIAGNOSIS — N184 Chronic kidney disease, stage 4 (severe): Secondary | ICD-10-CM | POA: Insufficient documentation

## 2019-01-25 LAB — POCT HEMOGLOBIN-HEMACUE: Hemoglobin: 10.7 g/dL — ABNORMAL LOW (ref 12.0–15.0)

## 2019-01-25 MED ORDER — EPOETIN ALFA-EPBX 3000 UNIT/ML IJ SOLN
3000.0000 [IU] | Freq: Once | INTRAMUSCULAR | Status: AC
  Administered 2019-01-25: 3000 [IU] via SUBCUTANEOUS
  Filled 2019-01-25: qty 1

## 2019-01-25 MED ORDER — EPOETIN ALFA-EPBX 2000 UNIT/ML IJ SOLN
2000.0000 [IU] | Freq: Once | INTRAMUSCULAR | Status: AC
  Administered 2019-01-25: 2000 [IU] via SUBCUTANEOUS
  Filled 2019-01-25: qty 1

## 2019-01-25 MED ORDER — EPOETIN ALFA-EPBX 10000 UNIT/ML IJ SOLN
5000.0000 [IU] | INTRAMUSCULAR | Status: DC
  Filled 2019-01-25: qty 1

## 2019-02-01 ENCOUNTER — Encounter (HOSPITAL_COMMUNITY): Payer: Medicare HMO

## 2019-02-01 ENCOUNTER — Encounter (HOSPITAL_COMMUNITY)
Admission: RE | Admit: 2019-02-01 | Discharge: 2019-02-01 | Disposition: A | Discharge status: Home / Self Care | Payer: Medicare HMO | Source: Ambulatory Visit | Attending: Nephrology | Admitting: Nephrology

## 2019-02-01 VITALS — BP 160/60 | HR 56 | Temp 98.1°F | Resp 20

## 2019-02-01 DIAGNOSIS — N184 Chronic kidney disease, stage 4 (severe): Secondary | ICD-10-CM | POA: Insufficient documentation

## 2019-02-01 DIAGNOSIS — D631 Anemia in chronic kidney disease: Secondary | ICD-10-CM | POA: Diagnosis present

## 2019-02-01 LAB — POCT HEMOGLOBIN-HEMACUE: Hemoglobin: 10.9 g/dL — ABNORMAL LOW (ref 12.0–15.0)

## 2019-02-01 MED ORDER — EPOETIN ALFA-EPBX 3000 UNIT/ML IJ SOLN
3000.0000 [IU] | Freq: Once | INTRAMUSCULAR | Status: AC
  Administered 2019-02-01: 3000 [IU] via SUBCUTANEOUS
  Filled 2019-02-01: qty 1

## 2019-02-01 MED ORDER — EPOETIN ALFA-EPBX 10000 UNIT/ML IJ SOLN: 5000.0000 [IU] | INTRAMUSCULAR | Status: DC

## 2019-02-01 MED ORDER — EPOETIN ALFA-EPBX 2000 UNIT/ML IJ SOLN
2000.0000 [IU] | Freq: Once | INTRAMUSCULAR | Status: AC
  Administered 2019-02-01: 2000 [IU] via SUBCUTANEOUS
  Filled 2019-02-01: qty 1

## 2019-02-07 ENCOUNTER — Other Ambulatory Visit: Payer: Self-pay

## 2019-02-07 NOTE — Patient Outreach (Signed)
[facility]) Care Management  02/07/2019  Jaiyah Beining 01/24/1929 [id number]     2nd attempt to outreach the patient initial assessment.  No answer the phone rang 4 times and the went dead.  Plan: Outreach attempt has been made. If no response to calls and letter in ten business days Henry will proceed with case closure.   [name] RN, BSN, [place] Direct Dial:  [phone]  Fax: [phone]

## 2019-02-08 ENCOUNTER — Encounter (HOSPITAL_COMMUNITY)
Admission: RE | Admit: 2019-02-08 | Discharge: 2019-02-08 | Disposition: A | Discharge status: Home / Self Care | Payer: Medicare HMO | Source: Ambulatory Visit | Attending: Nephrology | Admitting: Nephrology

## 2019-02-08 VITALS — BP 159/60 | HR 55 | Temp 98.1°F | Resp 20

## 2019-02-08 DIAGNOSIS — N184 Chronic kidney disease, stage 4 (severe): Secondary | ICD-10-CM | POA: Diagnosis not present

## 2019-02-08 DIAGNOSIS — D631 Anemia in chronic kidney disease: Secondary | ICD-10-CM | POA: Diagnosis present

## 2019-02-08 LAB — IRON AND TIBC
Iron: 59 ug/dL (ref 28–170)
Saturation Ratios: 18 % (ref 10.4–31.8)
TIBC: 328 ug/dL (ref 250–450)
UIBC: 269 ug/dL

## 2019-02-08 LAB — FERRITIN: Ferritin: 37 ng/mL (ref 11–307)

## 2019-02-08 MED ORDER — EPOETIN ALFA-EPBX 10000 UNIT/ML IJ SOLN: 5000.0000 [IU] | INTRAMUSCULAR | Status: DC

## 2019-02-08 MED ORDER — EPOETIN ALFA-EPBX 2000 UNIT/ML IJ SOLN
2000.0000 [IU] | Freq: Once | INTRAMUSCULAR | Status: AC
  Administered 2019-02-08: 2000 [IU] via SUBCUTANEOUS
  Filled 2019-02-08: qty 1

## 2019-02-08 MED ORDER — EPOETIN ALFA-EPBX 3000 UNIT/ML IJ SOLN
3000.0000 [IU] | Freq: Once | INTRAMUSCULAR | Status: AC
  Administered 2019-02-08: 3000 [IU] via SUBCUTANEOUS
  Filled 2019-02-08: qty 1

## 2019-02-09 LAB — POCT HEMOGLOBIN-HEMACUE: Hemoglobin: 10.4 g/dL — ABNORMAL LOW (ref 12.0–15.0)

## 2019-02-15 ENCOUNTER — Ambulatory Visit (HOSPITAL_COMMUNITY)
Admission: RE | Admit: 2019-02-15 | Discharge: 2019-02-15 | Disposition: A | Discharge status: Home / Self Care | Payer: Medicare HMO | Source: Ambulatory Visit | Attending: Nephrology | Admitting: Nephrology

## 2019-02-15 ENCOUNTER — Other Ambulatory Visit: Payer: Self-pay

## 2019-02-15 VITALS — BP 146/50 | HR 56 | Resp 20

## 2019-02-15 DIAGNOSIS — D631 Anemia in chronic kidney disease: Secondary | ICD-10-CM

## 2019-02-15 DIAGNOSIS — N184 Chronic kidney disease, stage 4 (severe): Secondary | ICD-10-CM

## 2019-02-15 LAB — POCT HEMOGLOBIN-HEMACUE: Hemoglobin: 10.7 g/dL — ABNORMAL LOW (ref 12.0–15.0)

## 2019-02-15 MED ORDER — EPOETIN ALFA-EPBX 10000 UNIT/ML IJ SOLN: 5000.0000 [IU] | INTRAMUSCULAR | Status: DC

## 2019-02-15 MED ORDER — EPOETIN ALFA-EPBX 2000 UNIT/ML IJ SOLN
2000.0000 [IU] | Freq: Once | INTRAMUSCULAR | Status: AC
  Administered 2019-02-15: 2000 [IU] via SUBCUTANEOUS
  Filled 2019-02-15: qty 1

## 2019-02-15 MED ORDER — EPOETIN ALFA-EPBX 3000 UNIT/ML IJ SOLN
3000.0000 [IU] | Freq: Once | INTRAMUSCULAR | Status: AC
  Administered 2019-02-15: 3000 [IU] via SUBCUTANEOUS
  Filled 2019-02-15: qty 1

## 2019-02-16 ENCOUNTER — Other Ambulatory Visit: Payer: Self-pay

## 2019-02-16 NOTE — Patient Outreach (Signed)
[place] [facility]) Care Management  02/16/2019  Alicia Sellers [birth year] [id number]    Multiple attempts to establish contact with the patient without success. No response from calls or letter mailed to patient. Case is being closed at this time.   [name] RN, BSN, [place] Direct Dial:  [phone]  Fax: [phone]

## 2019-02-22 ENCOUNTER — Other Ambulatory Visit: Payer: Self-pay

## 2019-02-22 ENCOUNTER — Ambulatory Visit (HOSPITAL_COMMUNITY)
Admission: RE | Admit: 2019-02-22 | Discharge: 2019-02-22 | Disposition: A | Discharge status: Home / Self Care | Payer: Medicare HMO | Source: Ambulatory Visit | Attending: Nephrology | Admitting: Nephrology

## 2019-02-22 VITALS — BP 158/56 | HR 52 | Temp 97.9°F | Resp 20

## 2019-02-22 DIAGNOSIS — N184 Chronic kidney disease, stage 4 (severe): Secondary | ICD-10-CM | POA: Diagnosis present

## 2019-02-22 DIAGNOSIS — D631 Anemia in chronic kidney disease: Secondary | ICD-10-CM | POA: Diagnosis present

## 2019-02-22 LAB — POCT HEMOGLOBIN-HEMACUE: Hemoglobin: 10.1 g/dL — ABNORMAL LOW (ref 12.0–15.0)

## 2019-02-22 MED ORDER — EPOETIN ALFA-EPBX 2000 UNIT/ML IJ SOLN
2000.0000 [IU] | Freq: Once | INTRAMUSCULAR | Status: AC
  Administered 2019-02-22: 2000 [IU] via SUBCUTANEOUS
  Filled 2019-02-22: qty 1

## 2019-02-22 MED ORDER — EPOETIN ALFA-EPBX 10000 UNIT/ML IJ SOLN: 5000.0000 [IU] | INTRAMUSCULAR | Status: DC

## 2019-02-22 MED ORDER — EPOETIN ALFA-EPBX 3000 UNIT/ML IJ SOLN
3000.0000 [IU] | Freq: Once | INTRAMUSCULAR | Status: AC
  Administered 2019-02-22: 3000 [IU] via SUBCUTANEOUS
  Filled 2019-02-22: qty 1

## 2019-03-01 ENCOUNTER — Ambulatory Visit (HOSPITAL_COMMUNITY)
Admission: RE | Admit: 2019-03-01 | Discharge: 2019-03-01 | Disposition: A | Discharge status: Home / Self Care | Payer: Medicare HMO | Source: Ambulatory Visit | Attending: Nephrology | Admitting: Nephrology

## 2019-03-01 ENCOUNTER — Other Ambulatory Visit: Payer: Self-pay

## 2019-03-01 VITALS — BP 159/61 | HR 54 | Temp 97.7°F | Resp 18

## 2019-03-01 DIAGNOSIS — N184 Chronic kidney disease, stage 4 (severe): Secondary | ICD-10-CM | POA: Diagnosis present

## 2019-03-01 DIAGNOSIS — D631 Anemia in chronic kidney disease: Secondary | ICD-10-CM | POA: Diagnosis present

## 2019-03-01 LAB — POCT HEMOGLOBIN-HEMACUE: Hemoglobin: 11.6 g/dL — ABNORMAL LOW (ref 12.0–15.0)

## 2019-03-01 MED ORDER — EPOETIN ALFA-EPBX 3000 UNIT/ML IJ SOLN
3000.0000 [IU] | Freq: Once | INTRAMUSCULAR | Status: AC
  Administered 2019-03-01: 3000 [IU] via SUBCUTANEOUS
  Filled 2019-03-01: qty 1

## 2019-03-01 MED ORDER — EPOETIN ALFA-EPBX 2000 UNIT/ML IJ SOLN
2000.0000 [IU] | Freq: Once | INTRAMUSCULAR | Status: AC
  Administered 2019-03-01: 2000 [IU] via SUBCUTANEOUS
  Filled 2019-03-01: qty 1

## 2019-03-01 MED ORDER — EPOETIN ALFA-EPBX 10000 UNIT/ML IJ SOLN: 5000.0000 [IU] | INTRAMUSCULAR | Status: DC

## 2019-03-07 ENCOUNTER — Other Ambulatory Visit: Payer: Self-pay

## 2019-03-08 ENCOUNTER — Encounter (HOSPITAL_COMMUNITY)
Admission: RE | Admit: 2019-03-08 | Discharge: 2019-03-08 | Disposition: A | Discharge status: Home / Self Care | Payer: Medicare HMO | Source: Ambulatory Visit | Attending: Nephrology | Admitting: Nephrology

## 2019-03-08 VITALS — BP 165/49 | HR 51 | Temp 97.9°F | Resp 20

## 2019-03-08 DIAGNOSIS — D631 Anemia in chronic kidney disease: Secondary | ICD-10-CM | POA: Diagnosis present

## 2019-03-08 DIAGNOSIS — N184 Chronic kidney disease, stage 4 (severe): Secondary | ICD-10-CM

## 2019-03-08 LAB — IRON AND TIBC
Iron: 61 ug/dL (ref 28–170)
Saturation Ratios: 18 % (ref 10.4–31.8)
TIBC: 340 ug/dL (ref 250–450)
UIBC: 279 ug/dL

## 2019-03-08 LAB — FERRITIN: Ferritin: 43 ng/mL (ref 11–307)

## 2019-03-08 LAB — POCT HEMOGLOBIN-HEMACUE: Hemoglobin: 10.8 g/dL — ABNORMAL LOW (ref 12.0–15.0)

## 2019-03-08 MED ORDER — EPOETIN ALFA-EPBX 3000 UNIT/ML IJ SOLN
3000.0000 [IU] | Freq: Once | INTRAMUSCULAR | Status: AC
  Administered 2019-03-08: 3000 [IU] via SUBCUTANEOUS
  Filled 2019-03-08: qty 1

## 2019-03-08 MED ORDER — EPOETIN ALFA-EPBX 2000 UNIT/ML IJ SOLN
2000.0000 [IU] | Freq: Once | INTRAMUSCULAR | Status: AC
  Administered 2019-03-08: 2000 [IU] via SUBCUTANEOUS
  Filled 2019-03-08: qty 1

## 2019-03-08 MED ORDER — EPOETIN ALFA-EPBX 10000 UNIT/ML IJ SOLN: 5000.0000 [IU] | INTRAMUSCULAR | Status: DC

## 2019-03-21 ENCOUNTER — Other Ambulatory Visit: Payer: Self-pay

## 2019-03-22 ENCOUNTER — Ambulatory Visit (HOSPITAL_COMMUNITY)
Admission: RE | Admit: 2019-03-22 | Discharge: 2019-03-22 | Disposition: A | Discharge status: Home / Self Care | Payer: Medicare HMO | Source: Ambulatory Visit | Attending: Nephrology | Admitting: Nephrology

## 2019-03-22 VITALS — BP 170/51 | HR 61 | Temp 96.7°F | Resp 20

## 2019-03-22 DIAGNOSIS — N184 Chronic kidney disease, stage 4 (severe): Secondary | ICD-10-CM | POA: Diagnosis not present

## 2019-03-22 DIAGNOSIS — D631 Anemia in chronic kidney disease: Secondary | ICD-10-CM | POA: Insufficient documentation

## 2019-03-22 MED ORDER — EPOETIN ALFA-EPBX 10000 UNIT/ML IJ SOLN
10000.0000 [IU] | INTRAMUSCULAR | Status: DC
  Administered 2019-03-22: 10000 [IU] via SUBCUTANEOUS
  Filled 2019-03-22: qty 1

## 2019-03-23 LAB — POCT HEMOGLOBIN-HEMACUE: Hemoglobin: 9.7 g/dL — ABNORMAL LOW (ref 12.0–15.0)

## 2019-03-27 DIAGNOSIS — I5022 Chronic systolic (congestive) heart failure: Secondary | ICD-10-CM | POA: Diagnosis not present

## 2019-03-27 DIAGNOSIS — N184 Chronic kidney disease, stage 4 (severe): Secondary | ICD-10-CM | POA: Diagnosis not present

## 2019-03-27 DIAGNOSIS — I10 Essential (primary) hypertension: Secondary | ICD-10-CM | POA: Diagnosis not present

## 2019-03-27 DIAGNOSIS — E034 Atrophy of thyroid (acquired): Secondary | ICD-10-CM | POA: Diagnosis not present

## 2019-04-05 ENCOUNTER — Other Ambulatory Visit: Payer: Self-pay

## 2019-04-05 ENCOUNTER — Encounter (HOSPITAL_COMMUNITY): Payer: Medicare HMO

## 2019-04-05 ENCOUNTER — Ambulatory Visit (HOSPITAL_COMMUNITY)
Admission: RE | Admit: 2019-04-05 | Discharge: 2019-04-05 | Disposition: A | Discharge status: Home / Self Care | Payer: Medicare HMO | Source: Ambulatory Visit | Attending: Nephrology | Admitting: Nephrology

## 2019-04-05 VITALS — BP 145/54 | HR 55 | Temp 97.0°F | Resp 20

## 2019-04-05 DIAGNOSIS — N184 Chronic kidney disease, stage 4 (severe): Secondary | ICD-10-CM | POA: Diagnosis not present

## 2019-04-05 DIAGNOSIS — D631 Anemia in chronic kidney disease: Secondary | ICD-10-CM | POA: Insufficient documentation

## 2019-04-05 LAB — POCT HEMOGLOBIN-HEMACUE: Hemoglobin: 10.1 g/dL — ABNORMAL LOW (ref 12.0–15.0)

## 2019-04-05 MED ORDER — EPOETIN ALFA-EPBX 10000 UNIT/ML IJ SOLN
10000.0000 [IU] | INTRAMUSCULAR | Status: DC
  Administered 2019-04-05: 10000 [IU] via SUBCUTANEOUS
  Filled 2019-04-05: qty 1

## 2019-04-19 ENCOUNTER — Ambulatory Visit (HOSPITAL_COMMUNITY)
Admission: RE | Admit: 2019-04-19 | Discharge: 2019-04-19 | Disposition: A | Discharge status: Home / Self Care | Payer: Medicare HMO | Source: Ambulatory Visit | Attending: Nephrology | Admitting: Nephrology

## 2019-04-19 ENCOUNTER — Other Ambulatory Visit: Payer: Self-pay

## 2019-04-19 VITALS — BP 170/48 | HR 57 | Temp 96.1°F | Resp 20

## 2019-04-19 DIAGNOSIS — D631 Anemia in chronic kidney disease: Secondary | ICD-10-CM | POA: Diagnosis present

## 2019-04-19 DIAGNOSIS — N184 Chronic kidney disease, stage 4 (severe): Secondary | ICD-10-CM | POA: Insufficient documentation

## 2019-04-19 LAB — IRON AND TIBC
Iron: 101 ug/dL (ref 28–170)
Saturation Ratios: 32 % — ABNORMAL HIGH (ref 10.4–31.8)
TIBC: 314 ug/dL (ref 250–450)
UIBC: 213 ug/dL

## 2019-04-19 LAB — FERRITIN: Ferritin: 93 ng/mL (ref 11–307)

## 2019-04-19 MED ORDER — EPOETIN ALFA-EPBX 10000 UNIT/ML IJ SOLN
10000.0000 [IU] | INTRAMUSCULAR | Status: DC
  Administered 2019-04-19: 10000 [IU] via SUBCUTANEOUS
  Filled 2019-04-19: qty 1

## 2019-04-24 DIAGNOSIS — N184 Chronic kidney disease, stage 4 (severe): Secondary | ICD-10-CM | POA: Diagnosis not present

## 2019-04-24 DIAGNOSIS — Z0000 Encounter for general adult medical examination without abnormal findings: Secondary | ICD-10-CM | POA: Diagnosis not present

## 2019-04-24 DIAGNOSIS — E7800 Pure hypercholesterolemia, unspecified: Secondary | ICD-10-CM | POA: Diagnosis not present

## 2019-04-24 DIAGNOSIS — D649 Anemia, unspecified: Secondary | ICD-10-CM | POA: Diagnosis not present

## 2019-04-24 DIAGNOSIS — M1990 Unspecified osteoarthritis, unspecified site: Secondary | ICD-10-CM | POA: Diagnosis not present

## 2019-04-24 DIAGNOSIS — G720 Drug-induced myopathy: Secondary | ICD-10-CM | POA: Diagnosis not present

## 2019-04-24 DIAGNOSIS — M8580 Other specified disorders of bone density and structure, unspecified site: Secondary | ICD-10-CM | POA: Diagnosis not present

## 2019-04-24 DIAGNOSIS — R69 Illness, unspecified: Secondary | ICD-10-CM | POA: Diagnosis not present

## 2019-04-24 DIAGNOSIS — I129 Hypertensive chronic kidney disease with stage 1 through stage 4 chronic kidney disease, or unspecified chronic kidney disease: Secondary | ICD-10-CM | POA: Diagnosis not present

## 2019-04-24 DIAGNOSIS — E039 Hypothyroidism, unspecified: Secondary | ICD-10-CM | POA: Diagnosis not present

## 2019-04-25 LAB — POCT HEMOGLOBIN-HEMACUE: Hemoglobin: 10.1 g/dL — ABNORMAL LOW (ref 12.0–15.0)

## 2019-05-02 ENCOUNTER — Other Ambulatory Visit: Payer: Self-pay

## 2019-05-03 ENCOUNTER — Ambulatory Visit (HOSPITAL_COMMUNITY)
Admission: RE | Admit: 2019-05-03 | Discharge: 2019-05-03 | Disposition: A | Discharge status: Home / Self Care | Payer: Medicare HMO | Source: Ambulatory Visit | Attending: Nephrology | Admitting: Nephrology

## 2019-05-03 VITALS — BP 157/41 | HR 54 | Temp 97.6°F | Resp 18

## 2019-05-03 DIAGNOSIS — N184 Chronic kidney disease, stage 4 (severe): Secondary | ICD-10-CM | POA: Diagnosis not present

## 2019-05-03 DIAGNOSIS — D631 Anemia in chronic kidney disease: Secondary | ICD-10-CM | POA: Diagnosis present

## 2019-05-03 LAB — POCT HEMOGLOBIN-HEMACUE: Hemoglobin: 9.8 g/dL — ABNORMAL LOW (ref 12.0–15.0)

## 2019-05-03 MED ORDER — EPOETIN ALFA-EPBX 10000 UNIT/ML IJ SOLN
10000.0000 [IU] | INTRAMUSCULAR | Status: DC
  Administered 2019-05-03: 10000 [IU] via SUBCUTANEOUS
  Filled 2019-05-03: qty 1

## 2019-05-05 DIAGNOSIS — I10 Essential (primary) hypertension: Secondary | ICD-10-CM | POA: Diagnosis not present

## 2019-05-05 DIAGNOSIS — M1990 Unspecified osteoarthritis, unspecified site: Secondary | ICD-10-CM | POA: Diagnosis not present

## 2019-05-05 DIAGNOSIS — N184 Chronic kidney disease, stage 4 (severe): Secondary | ICD-10-CM | POA: Diagnosis not present

## 2019-05-05 DIAGNOSIS — E7800 Pure hypercholesterolemia, unspecified: Secondary | ICD-10-CM | POA: Diagnosis not present

## 2019-05-05 DIAGNOSIS — M810 Age-related osteoporosis without current pathological fracture: Secondary | ICD-10-CM | POA: Diagnosis not present

## 2019-05-05 DIAGNOSIS — E039 Hypothyroidism, unspecified: Secondary | ICD-10-CM | POA: Diagnosis not present

## 2019-05-05 DIAGNOSIS — I509 Heart failure, unspecified: Secondary | ICD-10-CM | POA: Diagnosis not present

## 2019-05-05 DIAGNOSIS — I129 Hypertensive chronic kidney disease with stage 1 through stage 4 chronic kidney disease, or unspecified chronic kidney disease: Secondary | ICD-10-CM | POA: Diagnosis not present

## 2019-05-15 DIAGNOSIS — I129 Hypertensive chronic kidney disease with stage 1 through stage 4 chronic kidney disease, or unspecified chronic kidney disease: Secondary | ICD-10-CM | POA: Diagnosis not present

## 2019-05-15 DIAGNOSIS — D631 Anemia in chronic kidney disease: Secondary | ICD-10-CM | POA: Diagnosis not present

## 2019-05-15 DIAGNOSIS — N184 Chronic kidney disease, stage 4 (severe): Secondary | ICD-10-CM | POA: Diagnosis not present

## 2019-05-15 DIAGNOSIS — E039 Hypothyroidism, unspecified: Secondary | ICD-10-CM | POA: Diagnosis not present

## 2019-05-15 DIAGNOSIS — E785 Hyperlipidemia, unspecified: Secondary | ICD-10-CM | POA: Diagnosis not present

## 2019-05-15 DIAGNOSIS — N189 Chronic kidney disease, unspecified: Secondary | ICD-10-CM | POA: Diagnosis not present

## 2019-05-15 DIAGNOSIS — D519 Vitamin B12 deficiency anemia, unspecified: Secondary | ICD-10-CM | POA: Diagnosis not present

## 2019-05-15 DIAGNOSIS — N2581 Secondary hyperparathyroidism of renal origin: Secondary | ICD-10-CM | POA: Diagnosis not present

## 2019-05-17 ENCOUNTER — Ambulatory Visit (HOSPITAL_COMMUNITY)
Admission: RE | Admit: 2019-05-17 | Discharge: 2019-05-17 | Disposition: A | Discharge status: Home / Self Care | Payer: Medicare HMO | Source: Ambulatory Visit | Attending: Nephrology | Admitting: Nephrology

## 2019-05-17 ENCOUNTER — Other Ambulatory Visit: Payer: Self-pay

## 2019-05-17 VITALS — BP 158/51 | HR 51 | Temp 96.4°F | Resp 20

## 2019-05-17 DIAGNOSIS — D631 Anemia in chronic kidney disease: Secondary | ICD-10-CM | POA: Insufficient documentation

## 2019-05-17 DIAGNOSIS — N184 Chronic kidney disease, stage 4 (severe): Secondary | ICD-10-CM | POA: Diagnosis not present

## 2019-05-17 LAB — POCT HEMOGLOBIN-HEMACUE: Hemoglobin: 9.7 g/dL — ABNORMAL LOW (ref 12.0–15.0)

## 2019-05-17 LAB — IRON AND TIBC
Iron: 98 ug/dL (ref 28–170)
Saturation Ratios: 31 % (ref 10.4–31.8)
TIBC: 315 ug/dL (ref 250–450)
UIBC: 217 ug/dL

## 2019-05-17 LAB — FERRITIN: Ferritin: 87 ng/mL (ref 11–307)

## 2019-05-17 MED ORDER — EPOETIN ALFA-EPBX 10000 UNIT/ML IJ SOLN
20000.0000 [IU] | INTRAMUSCULAR | Status: DC
  Administered 2019-05-17: 20000 [IU] via SUBCUTANEOUS
  Filled 2019-05-17: qty 2

## 2019-05-17 MED ORDER — EPOETIN ALFA-EPBX 10000 UNIT/ML IJ SOLN
10000.0000 [IU] | INTRAMUSCULAR | Status: DC
  Filled 2019-05-17: qty 1

## 2019-05-23 DIAGNOSIS — H9113 Presbycusis, bilateral: Secondary | ICD-10-CM | POA: Diagnosis not present

## 2019-05-23 DIAGNOSIS — H9212 Otorrhea, left ear: Secondary | ICD-10-CM | POA: Diagnosis not present

## 2019-05-31 ENCOUNTER — Other Ambulatory Visit: Payer: Self-pay

## 2019-05-31 ENCOUNTER — Encounter (HOSPITAL_COMMUNITY)
Admission: RE | Admit: 2019-05-31 | Discharge: 2019-05-31 | Disposition: A | Discharge status: Home / Self Care | Payer: Medicare HMO | Source: Ambulatory Visit | Attending: Nephrology | Admitting: Nephrology

## 2019-05-31 VITALS — BP 162/51 | HR 55 | Temp 95.9°F | Resp 20

## 2019-05-31 DIAGNOSIS — N184 Chronic kidney disease, stage 4 (severe): Secondary | ICD-10-CM | POA: Diagnosis present

## 2019-05-31 DIAGNOSIS — D631 Anemia in chronic kidney disease: Secondary | ICD-10-CM | POA: Insufficient documentation

## 2019-05-31 LAB — POCT HEMOGLOBIN-HEMACUE: Hemoglobin: 10.4 g/dL — ABNORMAL LOW (ref 12.0–15.0)

## 2019-05-31 MED ORDER — EPOETIN ALFA-EPBX 10000 UNIT/ML IJ SOLN
20000.0000 [IU] | INTRAMUSCULAR | Status: DC
  Administered 2019-05-31: 20000 [IU] via SUBCUTANEOUS
  Filled 2019-05-31: qty 2

## 2019-05-31 MED ORDER — CLONIDINE HCL 0.1 MG PO TABS: 0.1000 mg | ORAL_TABLET | Freq: Once | ORAL | Status: DC | PRN

## 2019-06-06 DIAGNOSIS — E039 Hypothyroidism, unspecified: Secondary | ICD-10-CM | POA: Diagnosis not present

## 2019-06-06 DIAGNOSIS — N184 Chronic kidney disease, stage 4 (severe): Secondary | ICD-10-CM | POA: Diagnosis not present

## 2019-06-06 DIAGNOSIS — I509 Heart failure, unspecified: Secondary | ICD-10-CM | POA: Diagnosis not present

## 2019-06-06 DIAGNOSIS — I10 Essential (primary) hypertension: Secondary | ICD-10-CM | POA: Diagnosis not present

## 2019-06-06 DIAGNOSIS — M1990 Unspecified osteoarthritis, unspecified site: Secondary | ICD-10-CM | POA: Diagnosis not present

## 2019-06-06 DIAGNOSIS — I129 Hypertensive chronic kidney disease with stage 1 through stage 4 chronic kidney disease, or unspecified chronic kidney disease: Secondary | ICD-10-CM | POA: Diagnosis not present

## 2019-06-06 DIAGNOSIS — M810 Age-related osteoporosis without current pathological fracture: Secondary | ICD-10-CM | POA: Diagnosis not present

## 2019-06-06 DIAGNOSIS — E7800 Pure hypercholesterolemia, unspecified: Secondary | ICD-10-CM | POA: Diagnosis not present

## 2019-06-13 ENCOUNTER — Other Ambulatory Visit: Payer: Self-pay

## 2019-06-14 ENCOUNTER — Ambulatory Visit (HOSPITAL_COMMUNITY)
Admission: RE | Admit: 2019-06-14 | Discharge: 2019-06-14 | Disposition: A | Discharge status: Home / Self Care | Payer: Medicare HMO | Source: Ambulatory Visit | Attending: Nephrology | Admitting: Nephrology

## 2019-06-14 VITALS — BP 168/75 | HR 56 | Temp 97.4°F | Resp 20

## 2019-06-14 DIAGNOSIS — D631 Anemia in chronic kidney disease: Secondary | ICD-10-CM

## 2019-06-14 DIAGNOSIS — N184 Chronic kidney disease, stage 4 (severe): Secondary | ICD-10-CM | POA: Insufficient documentation

## 2019-06-14 LAB — IRON AND TIBC
Iron: 89 ug/dL (ref 28–170)
Saturation Ratios: 27 % (ref 10.4–31.8)
TIBC: 325 ug/dL (ref 250–450)
UIBC: 236 ug/dL

## 2019-06-14 LAB — FERRITIN: Ferritin: 75 ng/mL (ref 11–307)

## 2019-06-14 LAB — POCT HEMOGLOBIN-HEMACUE: Hemoglobin: 10.1 g/dL — ABNORMAL LOW (ref 12.0–15.0)

## 2019-06-14 MED ORDER — EPOETIN ALFA-EPBX 10000 UNIT/ML IJ SOLN
20000.0000 [IU] | INTRAMUSCULAR | Status: DC
  Administered 2019-06-14: 20000 [IU] via SUBCUTANEOUS
  Filled 2019-06-14: qty 2

## 2019-06-16 DIAGNOSIS — N184 Chronic kidney disease, stage 4 (severe): Secondary | ICD-10-CM | POA: Diagnosis not present

## 2019-06-16 DIAGNOSIS — I5022 Chronic systolic (congestive) heart failure: Secondary | ICD-10-CM | POA: Diagnosis not present

## 2019-06-16 DIAGNOSIS — R0602 Shortness of breath: Secondary | ICD-10-CM | POA: Diagnosis not present

## 2019-06-16 DIAGNOSIS — E034 Atrophy of thyroid (acquired): Secondary | ICD-10-CM | POA: Diagnosis not present

## 2019-06-27 ENCOUNTER — Other Ambulatory Visit: Payer: Self-pay

## 2019-06-28 ENCOUNTER — Ambulatory Visit (HOSPITAL_COMMUNITY)
Admission: RE | Admit: 2019-06-28 | Discharge: 2019-06-28 | Disposition: A | Discharge status: Home / Self Care | Payer: Medicare HMO | Source: Ambulatory Visit | Attending: Nephrology | Admitting: Nephrology

## 2019-06-28 VITALS — BP 175/70 | HR 58 | Temp 96.3°F | Resp 20

## 2019-06-28 DIAGNOSIS — N184 Chronic kidney disease, stage 4 (severe): Secondary | ICD-10-CM

## 2019-06-28 DIAGNOSIS — D631 Anemia in chronic kidney disease: Secondary | ICD-10-CM

## 2019-06-28 MED ORDER — EPOETIN ALFA-EPBX 10000 UNIT/ML IJ SOLN
20000.0000 [IU] | INTRAMUSCULAR | Status: DC
  Administered 2019-06-28: 20000 [IU] via SUBCUTANEOUS
  Filled 2019-06-28: qty 2

## 2019-06-29 LAB — POCT HEMOGLOBIN-HEMACUE: Hemoglobin: 10.2 g/dL — ABNORMAL LOW (ref 12.0–15.0)

## 2019-07-05 DIAGNOSIS — E7800 Pure hypercholesterolemia, unspecified: Secondary | ICD-10-CM | POA: Diagnosis not present

## 2019-07-05 DIAGNOSIS — I129 Hypertensive chronic kidney disease with stage 1 through stage 4 chronic kidney disease, or unspecified chronic kidney disease: Secondary | ICD-10-CM | POA: Diagnosis not present

## 2019-07-05 DIAGNOSIS — I509 Heart failure, unspecified: Secondary | ICD-10-CM | POA: Diagnosis not present

## 2019-07-05 DIAGNOSIS — I10 Essential (primary) hypertension: Secondary | ICD-10-CM | POA: Diagnosis not present

## 2019-07-05 DIAGNOSIS — E039 Hypothyroidism, unspecified: Secondary | ICD-10-CM | POA: Diagnosis not present

## 2019-07-05 DIAGNOSIS — N184 Chronic kidney disease, stage 4 (severe): Secondary | ICD-10-CM | POA: Diagnosis not present

## 2019-07-05 DIAGNOSIS — M1990 Unspecified osteoarthritis, unspecified site: Secondary | ICD-10-CM | POA: Diagnosis not present

## 2019-07-05 DIAGNOSIS — M810 Age-related osteoporosis without current pathological fracture: Secondary | ICD-10-CM | POA: Diagnosis not present

## 2019-07-11 DIAGNOSIS — H52203 Unspecified astigmatism, bilateral: Secondary | ICD-10-CM | POA: Diagnosis not present

## 2019-07-11 DIAGNOSIS — H524 Presbyopia: Secondary | ICD-10-CM | POA: Diagnosis not present

## 2019-07-11 DIAGNOSIS — Z961 Presence of intraocular lens: Secondary | ICD-10-CM | POA: Diagnosis not present

## 2019-07-12 ENCOUNTER — Other Ambulatory Visit: Payer: Self-pay

## 2019-07-12 ENCOUNTER — Ambulatory Visit (HOSPITAL_COMMUNITY)
Admission: RE | Admit: 2019-07-12 | Discharge: 2019-07-12 | Disposition: A | Discharge status: Home / Self Care | Payer: Medicare HMO | Source: Ambulatory Visit | Attending: Nephrology | Admitting: Nephrology

## 2019-07-12 VITALS — BP 176/54 | HR 58 | Temp 95.1°F | Resp 20

## 2019-07-12 DIAGNOSIS — D631 Anemia in chronic kidney disease: Secondary | ICD-10-CM

## 2019-07-12 DIAGNOSIS — N184 Chronic kidney disease, stage 4 (severe): Secondary | ICD-10-CM | POA: Diagnosis present

## 2019-07-12 LAB — IRON AND TIBC
Iron: 96 ug/dL (ref 28–170)
Saturation Ratios: 30 % (ref 10.4–31.8)
TIBC: 325 ug/dL (ref 250–450)
UIBC: 229 ug/dL

## 2019-07-12 LAB — FERRITIN: Ferritin: 86 ng/mL (ref 11–307)

## 2019-07-12 LAB — POCT HEMOGLOBIN-HEMACUE: Hemoglobin: 10.4 g/dL — ABNORMAL LOW (ref 12.0–15.0)

## 2019-07-12 MED ORDER — EPOETIN ALFA-EPBX 10000 UNIT/ML IJ SOLN
20000.0000 [IU] | INTRAMUSCULAR | Status: DC
  Administered 2019-07-12: 20000 [IU] via SUBCUTANEOUS
  Filled 2019-07-12: qty 2

## 2019-07-25 DIAGNOSIS — I509 Heart failure, unspecified: Secondary | ICD-10-CM | POA: Diagnosis not present

## 2019-07-25 DIAGNOSIS — M810 Age-related osteoporosis without current pathological fracture: Secondary | ICD-10-CM | POA: Diagnosis not present

## 2019-07-25 DIAGNOSIS — E039 Hypothyroidism, unspecified: Secondary | ICD-10-CM | POA: Diagnosis not present

## 2019-07-25 DIAGNOSIS — I10 Essential (primary) hypertension: Secondary | ICD-10-CM | POA: Diagnosis not present

## 2019-07-25 DIAGNOSIS — M1990 Unspecified osteoarthritis, unspecified site: Secondary | ICD-10-CM | POA: Diagnosis not present

## 2019-07-25 DIAGNOSIS — N184 Chronic kidney disease, stage 4 (severe): Secondary | ICD-10-CM | POA: Diagnosis not present

## 2019-07-25 DIAGNOSIS — E7800 Pure hypercholesterolemia, unspecified: Secondary | ICD-10-CM | POA: Diagnosis not present

## 2019-07-25 DIAGNOSIS — I129 Hypertensive chronic kidney disease with stage 1 through stage 4 chronic kidney disease, or unspecified chronic kidney disease: Secondary | ICD-10-CM | POA: Diagnosis not present

## 2019-07-26 ENCOUNTER — Other Ambulatory Visit: Payer: Self-pay

## 2019-07-26 ENCOUNTER — Ambulatory Visit (HOSPITAL_COMMUNITY)
Admission: RE | Admit: 2019-07-26 | Discharge: 2019-07-26 | Disposition: A | Discharge status: Home / Self Care | Payer: Medicare HMO | Source: Ambulatory Visit | Attending: Nephrology | Admitting: Nephrology

## 2019-07-26 VITALS — BP 149/57 | HR 56 | Temp 95.9°F | Resp 18

## 2019-07-26 DIAGNOSIS — N184 Chronic kidney disease, stage 4 (severe): Secondary | ICD-10-CM

## 2019-07-26 DIAGNOSIS — D631 Anemia in chronic kidney disease: Secondary | ICD-10-CM | POA: Diagnosis present

## 2019-07-26 LAB — POCT HEMOGLOBIN-HEMACUE: Hemoglobin: 10.2 g/dL — ABNORMAL LOW (ref 12.0–15.0)

## 2019-07-26 MED ORDER — EPOETIN ALFA-EPBX 10000 UNIT/ML IJ SOLN
20000.0000 [IU] | INTRAMUSCULAR | Status: DC
  Administered 2019-07-26: 09:00:00 20000 [IU] via SUBCUTANEOUS
  Filled 2019-07-26: qty 2

## 2019-08-09 ENCOUNTER — Other Ambulatory Visit: Payer: Self-pay

## 2019-08-09 ENCOUNTER — Ambulatory Visit (HOSPITAL_COMMUNITY)
Admission: RE | Admit: 2019-08-09 | Discharge: 2019-08-09 | Disposition: A | Discharge status: Home / Self Care | Payer: Medicare HMO | Source: Ambulatory Visit | Attending: Nephrology | Admitting: Nephrology

## 2019-08-09 VITALS — BP 163/52 | HR 58 | Temp 96.6°F | Resp 20

## 2019-08-09 DIAGNOSIS — D631 Anemia in chronic kidney disease: Secondary | ICD-10-CM

## 2019-08-09 DIAGNOSIS — N184 Chronic kidney disease, stage 4 (severe): Secondary | ICD-10-CM

## 2019-08-09 LAB — IRON AND TIBC
Iron: 86 ug/dL (ref 28–170)
Saturation Ratios: 28 % (ref 10.4–31.8)
TIBC: 304 ug/dL (ref 250–450)
UIBC: 218 ug/dL

## 2019-08-09 LAB — FERRITIN: Ferritin: 79 ng/mL (ref 11–307)

## 2019-08-09 MED ORDER — EPOETIN ALFA-EPBX 10000 UNIT/ML IJ SOLN
20000.0000 [IU] | INTRAMUSCULAR | Status: DC
  Administered 2019-08-09: 20000 [IU] via SUBCUTANEOUS
  Filled 2019-08-09: qty 2

## 2019-08-10 LAB — POCT HEMOGLOBIN-HEMACUE: Hemoglobin: 10.5 g/dL — ABNORMAL LOW (ref 12.0–15.0)

## 2019-08-23 ENCOUNTER — Ambulatory Visit (HOSPITAL_COMMUNITY)
Admission: RE | Admit: 2019-08-23 | Discharge: 2019-08-23 | Disposition: A | Discharge status: Home / Self Care | Payer: Medicare HMO | Source: Ambulatory Visit | Attending: Nephrology | Admitting: Nephrology

## 2019-08-23 ENCOUNTER — Other Ambulatory Visit: Payer: Self-pay

## 2019-08-23 VITALS — BP 147/55 | HR 55 | Temp 96.4°F | Resp 20

## 2019-08-23 DIAGNOSIS — D631 Anemia in chronic kidney disease: Secondary | ICD-10-CM | POA: Insufficient documentation

## 2019-08-23 DIAGNOSIS — N184 Chronic kidney disease, stage 4 (severe): Secondary | ICD-10-CM | POA: Diagnosis not present

## 2019-08-23 LAB — POCT HEMOGLOBIN-HEMACUE: Hemoglobin: 10.4 g/dL — ABNORMAL LOW (ref 12.0–15.0)

## 2019-08-23 MED ORDER — EPOETIN ALFA-EPBX 10000 UNIT/ML IJ SOLN
20000.0000 [IU] | INTRAMUSCULAR | Status: DC
  Administered 2019-08-23: 20000 [IU] via SUBCUTANEOUS
  Filled 2019-08-23: qty 2

## 2019-09-05 DIAGNOSIS — N184 Chronic kidney disease, stage 4 (severe): Secondary | ICD-10-CM | POA: Diagnosis not present

## 2019-09-05 DIAGNOSIS — I10 Essential (primary) hypertension: Secondary | ICD-10-CM | POA: Diagnosis not present

## 2019-09-05 DIAGNOSIS — I129 Hypertensive chronic kidney disease with stage 1 through stage 4 chronic kidney disease, or unspecified chronic kidney disease: Secondary | ICD-10-CM | POA: Diagnosis not present

## 2019-09-05 DIAGNOSIS — M810 Age-related osteoporosis without current pathological fracture: Secondary | ICD-10-CM | POA: Diagnosis not present

## 2019-09-05 DIAGNOSIS — I509 Heart failure, unspecified: Secondary | ICD-10-CM | POA: Diagnosis not present

## 2019-09-05 DIAGNOSIS — E7800 Pure hypercholesterolemia, unspecified: Secondary | ICD-10-CM | POA: Diagnosis not present

## 2019-09-05 DIAGNOSIS — E039 Hypothyroidism, unspecified: Secondary | ICD-10-CM | POA: Diagnosis not present

## 2019-09-05 DIAGNOSIS — M1990 Unspecified osteoarthritis, unspecified site: Secondary | ICD-10-CM | POA: Diagnosis not present

## 2019-09-06 ENCOUNTER — Other Ambulatory Visit: Payer: Self-pay

## 2019-09-06 ENCOUNTER — Ambulatory Visit (HOSPITAL_COMMUNITY)
Admission: RE | Admit: 2019-09-06 | Discharge: 2019-09-06 | Disposition: A | Discharge status: Home / Self Care | Payer: Medicare HMO | Source: Ambulatory Visit | Attending: Nephrology | Admitting: Nephrology

## 2019-09-06 VITALS — BP 161/61 | HR 62 | Temp 97.1°F | Resp 20

## 2019-09-06 DIAGNOSIS — N184 Chronic kidney disease, stage 4 (severe): Secondary | ICD-10-CM | POA: Diagnosis not present

## 2019-09-06 DIAGNOSIS — D631 Anemia in chronic kidney disease: Secondary | ICD-10-CM | POA: Diagnosis present

## 2019-09-06 LAB — POCT HEMOGLOBIN-HEMACUE: Hemoglobin: 10.7 g/dL — ABNORMAL LOW (ref 12.0–15.0)

## 2019-09-06 MED ORDER — EPOETIN ALFA-EPBX 10000 UNIT/ML IJ SOLN
20000.0000 [IU] | INTRAMUSCULAR | Status: DC
  Administered 2019-09-06: 09:00:00 20000 [IU] via SUBCUTANEOUS
  Filled 2019-09-06: qty 2

## 2019-09-11 DIAGNOSIS — D519 Vitamin B12 deficiency anemia, unspecified: Secondary | ICD-10-CM | POA: Diagnosis not present

## 2019-09-11 DIAGNOSIS — N2581 Secondary hyperparathyroidism of renal origin: Secondary | ICD-10-CM | POA: Diagnosis not present

## 2019-09-11 DIAGNOSIS — E785 Hyperlipidemia, unspecified: Secondary | ICD-10-CM | POA: Diagnosis not present

## 2019-09-11 DIAGNOSIS — E039 Hypothyroidism, unspecified: Secondary | ICD-10-CM | POA: Diagnosis not present

## 2019-09-11 DIAGNOSIS — N184 Chronic kidney disease, stage 4 (severe): Secondary | ICD-10-CM | POA: Diagnosis not present

## 2019-09-11 DIAGNOSIS — I129 Hypertensive chronic kidney disease with stage 1 through stage 4 chronic kidney disease, or unspecified chronic kidney disease: Secondary | ICD-10-CM | POA: Diagnosis not present

## 2019-09-12 DIAGNOSIS — R69 Illness, unspecified: Secondary | ICD-10-CM | POA: Diagnosis not present

## 2019-09-19 DIAGNOSIS — I10 Essential (primary) hypertension: Secondary | ICD-10-CM | POA: Diagnosis not present

## 2019-09-19 DIAGNOSIS — E034 Atrophy of thyroid (acquired): Secondary | ICD-10-CM | POA: Diagnosis not present

## 2019-09-19 DIAGNOSIS — I5022 Chronic systolic (congestive) heart failure: Secondary | ICD-10-CM | POA: Diagnosis not present

## 2019-09-19 DIAGNOSIS — N184 Chronic kidney disease, stage 4 (severe): Secondary | ICD-10-CM | POA: Diagnosis not present

## 2019-09-20 ENCOUNTER — Encounter (HOSPITAL_COMMUNITY)
Admission: RE | Admit: 2019-09-20 | Discharge: 2019-09-20 | Disposition: A | Discharge status: Home / Self Care | Payer: Medicare HMO | Source: Ambulatory Visit | Attending: Nephrology | Admitting: Nephrology

## 2019-09-20 ENCOUNTER — Other Ambulatory Visit: Payer: Self-pay

## 2019-09-20 VITALS — BP 155/48 | HR 55 | Temp 96.6°F | Resp 20

## 2019-09-20 DIAGNOSIS — D631 Anemia in chronic kidney disease: Secondary | ICD-10-CM | POA: Diagnosis present

## 2019-09-20 DIAGNOSIS — N184 Chronic kidney disease, stage 4 (severe): Secondary | ICD-10-CM | POA: Diagnosis not present

## 2019-09-20 LAB — IRON AND TIBC
Iron: 83 ug/dL (ref 28–170)
Saturation Ratios: 26 % (ref 10.4–31.8)
TIBC: 323 ug/dL (ref 250–450)
UIBC: 240 ug/dL

## 2019-09-20 LAB — FERRITIN: Ferritin: 85 ng/mL (ref 11–307)

## 2019-09-20 LAB — POCT HEMOGLOBIN-HEMACUE: Hemoglobin: 9.8 g/dL — ABNORMAL LOW (ref 12.0–15.0)

## 2019-09-20 MED ORDER — EPOETIN ALFA-EPBX 10000 UNIT/ML IJ SOLN
20000.0000 [IU] | INTRAMUSCULAR | Status: DC
  Administered 2019-09-20: 20000 [IU] via SUBCUTANEOUS
  Filled 2019-09-20: qty 2

## 2019-09-26 DIAGNOSIS — R69 Illness, unspecified: Secondary | ICD-10-CM | POA: Diagnosis not present

## 2019-10-04 ENCOUNTER — Encounter (HOSPITAL_COMMUNITY)
Admission: RE | Admit: 2019-10-04 | Discharge: 2019-10-04 | Disposition: A | Discharge status: Home / Self Care | Payer: Medicare HMO | Source: Ambulatory Visit | Attending: Nephrology | Admitting: Nephrology

## 2019-10-04 ENCOUNTER — Other Ambulatory Visit: Payer: Self-pay

## 2019-10-04 VITALS — BP 164/53 | HR 57 | Temp 95.4°F | Resp 20

## 2019-10-04 DIAGNOSIS — N184 Chronic kidney disease, stage 4 (severe): Secondary | ICD-10-CM | POA: Diagnosis not present

## 2019-10-04 DIAGNOSIS — D631 Anemia in chronic kidney disease: Secondary | ICD-10-CM | POA: Diagnosis not present

## 2019-10-04 LAB — POCT HEMOGLOBIN-HEMACUE: Hemoglobin: 10.4 g/dL — ABNORMAL LOW (ref 12.0–15.0)

## 2019-10-04 MED ORDER — EPOETIN ALFA-EPBX 10000 UNIT/ML IJ SOLN
20000.0000 [IU] | INTRAMUSCULAR | Status: DC
  Administered 2019-10-04: 20000 [IU] via SUBCUTANEOUS
  Filled 2019-10-04: qty 2

## 2019-10-18 ENCOUNTER — Ambulatory Visit (HOSPITAL_COMMUNITY)
Admission: RE | Admit: 2019-10-18 | Discharge: 2019-10-18 | Disposition: A | Discharge status: Home / Self Care | Payer: Medicare HMO | Source: Ambulatory Visit | Attending: Nephrology | Admitting: Nephrology

## 2019-10-18 ENCOUNTER — Other Ambulatory Visit: Payer: Self-pay

## 2019-10-18 VITALS — BP 126/48 | HR 64 | Temp 97.6°F | Resp 18

## 2019-10-18 DIAGNOSIS — N184 Chronic kidney disease, stage 4 (severe): Secondary | ICD-10-CM

## 2019-10-18 DIAGNOSIS — D631 Anemia in chronic kidney disease: Secondary | ICD-10-CM | POA: Diagnosis present

## 2019-10-18 LAB — FERRITIN: Ferritin: 134 ng/mL (ref 11–307)

## 2019-10-18 LAB — POCT HEMOGLOBIN-HEMACUE: Hemoglobin: 9.8 g/dL — ABNORMAL LOW (ref 12.0–15.0)

## 2019-10-18 LAB — IRON AND TIBC
Iron: 23 ug/dL — ABNORMAL LOW (ref 28–170)
Saturation Ratios: 9 % — ABNORMAL LOW (ref 10.4–31.8)
TIBC: 249 ug/dL — ABNORMAL LOW (ref 250–450)
UIBC: 226 ug/dL

## 2019-10-18 MED ORDER — EPOETIN ALFA-EPBX 10000 UNIT/ML IJ SOLN
20000.0000 [IU] | INTRAMUSCULAR | Status: DC
  Administered 2019-10-18: 20000 [IU] via SUBCUTANEOUS
  Filled 2019-10-18: qty 2

## 2019-10-23 DIAGNOSIS — E785 Hyperlipidemia, unspecified: Secondary | ICD-10-CM | POA: Diagnosis not present

## 2019-10-23 DIAGNOSIS — D519 Vitamin B12 deficiency anemia, unspecified: Secondary | ICD-10-CM | POA: Diagnosis not present

## 2019-10-23 DIAGNOSIS — N2581 Secondary hyperparathyroidism of renal origin: Secondary | ICD-10-CM | POA: Diagnosis not present

## 2019-10-23 DIAGNOSIS — N184 Chronic kidney disease, stage 4 (severe): Secondary | ICD-10-CM | POA: Diagnosis not present

## 2019-10-23 DIAGNOSIS — D631 Anemia in chronic kidney disease: Secondary | ICD-10-CM | POA: Diagnosis not present

## 2019-10-23 DIAGNOSIS — E039 Hypothyroidism, unspecified: Secondary | ICD-10-CM | POA: Diagnosis not present

## 2019-10-23 DIAGNOSIS — I129 Hypertensive chronic kidney disease with stage 1 through stage 4 chronic kidney disease, or unspecified chronic kidney disease: Secondary | ICD-10-CM | POA: Diagnosis not present

## 2019-10-25 ENCOUNTER — Other Ambulatory Visit: Payer: Self-pay | Admitting: Family Medicine

## 2019-10-25 ENCOUNTER — Ambulatory Visit
Admission: RE | Admit: 2019-10-25 | Discharge: 2019-10-25 | Disposition: A | Discharge status: Home / Self Care | Payer: Medicare HMO | Source: Ambulatory Visit | Attending: Family Medicine | Admitting: Family Medicine

## 2019-10-25 DIAGNOSIS — R059 Cough, unspecified: Secondary | ICD-10-CM

## 2019-10-25 DIAGNOSIS — R69 Illness, unspecified: Secondary | ICD-10-CM | POA: Diagnosis not present

## 2019-10-25 DIAGNOSIS — E7800 Pure hypercholesterolemia, unspecified: Secondary | ICD-10-CM | POA: Diagnosis not present

## 2019-10-25 DIAGNOSIS — G720 Drug-induced myopathy: Secondary | ICD-10-CM | POA: Diagnosis not present

## 2019-10-25 DIAGNOSIS — R05 Cough: Secondary | ICD-10-CM | POA: Diagnosis not present

## 2019-10-25 DIAGNOSIS — M8580 Other specified disorders of bone density and structure, unspecified site: Secondary | ICD-10-CM | POA: Diagnosis not present

## 2019-10-25 DIAGNOSIS — M1990 Unspecified osteoarthritis, unspecified site: Secondary | ICD-10-CM | POA: Diagnosis not present

## 2019-10-25 DIAGNOSIS — D649 Anemia, unspecified: Secondary | ICD-10-CM | POA: Diagnosis not present

## 2019-10-25 DIAGNOSIS — I129 Hypertensive chronic kidney disease with stage 1 through stage 4 chronic kidney disease, or unspecified chronic kidney disease: Secondary | ICD-10-CM | POA: Diagnosis not present

## 2019-10-25 DIAGNOSIS — R0989 Other specified symptoms and signs involving the circulatory and respiratory systems: Secondary | ICD-10-CM | POA: Diagnosis not present

## 2019-10-25 DIAGNOSIS — E039 Hypothyroidism, unspecified: Secondary | ICD-10-CM | POA: Diagnosis not present

## 2019-10-25 DIAGNOSIS — N184 Chronic kidney disease, stage 4 (severe): Secondary | ICD-10-CM | POA: Diagnosis not present

## 2019-10-26 ENCOUNTER — Inpatient Hospital Stay (HOSPITAL_COMMUNITY)
Admission: AD | Admit: 2019-10-26 | Discharge: 2019-10-30 | DRG: 291 | Disposition: A | Discharge status: Home / Self Care | Payer: Medicare HMO | Source: Ambulatory Visit | Attending: Cardiovascular Disease | Admitting: Cardiovascular Disease

## 2019-10-26 ENCOUNTER — Encounter (HOSPITAL_COMMUNITY): Payer: Self-pay | Admitting: Cardiovascular Disease

## 2019-10-26 DIAGNOSIS — Z7982 Long term (current) use of aspirin: Secondary | ICD-10-CM | POA: Diagnosis not present

## 2019-10-26 DIAGNOSIS — I130 Hypertensive heart and chronic kidney disease with heart failure and stage 1 through stage 4 chronic kidney disease, or unspecified chronic kidney disease: Principal | ICD-10-CM | POA: Diagnosis present

## 2019-10-26 DIAGNOSIS — I509 Heart failure, unspecified: Secondary | ICD-10-CM | POA: Diagnosis not present

## 2019-10-26 DIAGNOSIS — I5023 Acute on chronic systolic (congestive) heart failure: Secondary | ICD-10-CM | POA: Diagnosis present

## 2019-10-26 DIAGNOSIS — Z79890 Hormone replacement therapy (postmenopausal): Secondary | ICD-10-CM

## 2019-10-26 DIAGNOSIS — E039 Hypothyroidism, unspecified: Secondary | ICD-10-CM | POA: Diagnosis present

## 2019-10-26 DIAGNOSIS — Z20828 Contact with and (suspected) exposure to other viral communicable diseases: Secondary | ICD-10-CM | POA: Diagnosis not present

## 2019-10-26 DIAGNOSIS — I349 Nonrheumatic mitral valve disorder, unspecified: Secondary | ICD-10-CM | POA: Diagnosis not present

## 2019-10-26 DIAGNOSIS — R0602 Shortness of breath: Secondary | ICD-10-CM | POA: Diagnosis present

## 2019-10-26 DIAGNOSIS — D631 Anemia in chronic kidney disease: Secondary | ICD-10-CM | POA: Diagnosis present

## 2019-10-26 DIAGNOSIS — E034 Atrophy of thyroid (acquired): Secondary | ICD-10-CM | POA: Diagnosis not present

## 2019-10-26 DIAGNOSIS — I10 Essential (primary) hypertension: Secondary | ICD-10-CM | POA: Diagnosis present

## 2019-10-26 DIAGNOSIS — N189 Chronic kidney disease, unspecified: Secondary | ICD-10-CM | POA: Diagnosis present

## 2019-10-26 DIAGNOSIS — I361 Nonrheumatic tricuspid (valve) insufficiency: Secondary | ICD-10-CM | POA: Diagnosis not present

## 2019-10-26 DIAGNOSIS — D638 Anemia in other chronic diseases classified elsewhere: Secondary | ICD-10-CM | POA: Diagnosis not present

## 2019-10-26 DIAGNOSIS — Z79899 Other long term (current) drug therapy: Secondary | ICD-10-CM | POA: Diagnosis not present

## 2019-10-26 DIAGNOSIS — K5900 Constipation, unspecified: Secondary | ICD-10-CM | POA: Diagnosis present

## 2019-10-26 DIAGNOSIS — N184 Chronic kidney disease, stage 4 (severe): Secondary | ICD-10-CM | POA: Diagnosis not present

## 2019-10-26 DIAGNOSIS — I340 Nonrheumatic mitral (valve) insufficiency: Secondary | ICD-10-CM | POA: Diagnosis not present

## 2019-10-26 DIAGNOSIS — E872 Acidosis: Secondary | ICD-10-CM | POA: Diagnosis not present

## 2019-10-26 DIAGNOSIS — I420 Dilated cardiomyopathy: Secondary | ICD-10-CM | POA: Diagnosis not present

## 2019-10-26 DIAGNOSIS — N179 Acute kidney failure, unspecified: Secondary | ICD-10-CM | POA: Diagnosis not present

## 2019-10-26 DIAGNOSIS — I5022 Chronic systolic (congestive) heart failure: Secondary | ICD-10-CM | POA: Diagnosis not present

## 2019-10-26 HISTORY — DX: Acute on chronic systolic (congestive) heart failure: I50.23

## 2019-10-26 LAB — CREATININE, SERUM
Creatinine, Ser: 4.27 mg/dL — ABNORMAL HIGH (ref 0.44–1.00)
GFR calc Af Amer: 10 mL/min — ABNORMAL LOW (ref >60)
GFR calc non Af Amer: 9 mL/min — ABNORMAL LOW (ref >60)

## 2019-10-26 LAB — CBC
HCT: 25.3 % — ABNORMAL LOW (ref 36.0–46.0)
Hemoglobin: 8.5 g/dL — ABNORMAL LOW (ref 12.0–15.0)
MCH: 30.8 pg (ref 26.0–34.0)
MCHC: 33.6 g/dL (ref 30.0–36.0)
MCV: 91.7 fL (ref 80.0–100.0)
Platelets: 388 10*3/uL (ref 150–400)
RBC: 2.76 MIL/uL — ABNORMAL LOW (ref 3.87–5.11)
RDW: 16.7 % — ABNORMAL HIGH (ref 11.5–15.5)
WBC: 8.3 10*3/uL (ref 4.0–10.5)
nRBC: 0 % (ref 0.0–0.2)

## 2019-10-26 MED ORDER — SODIUM CHLORIDE 0.9 % IV SOLN
250.0000 mL | INTRAVENOUS | Status: DC | PRN
  Administered 2019-10-27: 250 mL via INTRAVENOUS

## 2019-10-26 MED ORDER — ONDANSETRON HCL 4 MG/2ML IJ SOLN
4.0000 mg | Freq: Four times a day (QID) | INTRAMUSCULAR | Status: DC | PRN
  Administered 2019-10-29: 4 mg via INTRAVENOUS
  Filled 2019-10-26: qty 2

## 2019-10-26 MED ORDER — SODIUM CHLORIDE 0.9% FLUSH: 3.0000 mL | INTRAVENOUS | Status: DC | PRN

## 2019-10-26 MED ORDER — SODIUM CHLORIDE 0.9% FLUSH
3.0000 mL | Freq: Two times a day (BID) | INTRAVENOUS | Status: DC
  Administered 2019-10-26 – 2019-10-30 (×6): 3 mL via INTRAVENOUS

## 2019-10-26 MED ORDER — ACETAMINOPHEN 325 MG PO TABS
650.0000 mg | ORAL_TABLET | ORAL | Status: DC | PRN
  Administered 2019-10-27 – 2019-10-28 (×3): 650 mg via ORAL
  Filled 2019-10-26 (×3): qty 2

## 2019-10-26 MED ORDER — ASPIRIN 81 MG PO CHEW
81.0000 mg | CHEWABLE_TABLET | Freq: Every day | ORAL | Status: DC
  Administered 2019-10-27 – 2019-10-30 (×4): 81 mg via ORAL
  Filled 2019-10-26 (×4): qty 1

## 2019-10-26 MED ORDER — FUROSEMIDE 10 MG/ML IJ SOLN
40.0000 mg | Freq: Two times a day (BID) | INTRAMUSCULAR | Status: DC
  Administered 2019-10-26 – 2019-10-27 (×2): 40 mg via INTRAVENOUS
  Filled 2019-10-26 (×2): qty 4

## 2019-10-26 MED ORDER — CITALOPRAM HYDROBROMIDE 10 MG PO TABS
10.0000 mg | ORAL_TABLET | Freq: Every day | ORAL | Status: DC
  Administered 2019-10-27 – 2019-10-30 (×4): 10 mg via ORAL
  Filled 2019-10-26 (×4): qty 1

## 2019-10-26 MED ORDER — ADULT MULTIVITAMIN W/MINERALS CH
1.0000 | ORAL_TABLET | Freq: Every day | ORAL | Status: DC
  Administered 2019-10-27 – 2019-10-30 (×4): 1 via ORAL
  Filled 2019-10-26 (×4): qty 1

## 2019-10-26 MED ORDER — RED YEAST RICE 600 MG PO TABS: 600.0000 | ORAL_TABLET | Freq: Two times a day (BID) | ORAL | Status: DC

## 2019-10-26 MED ORDER — MECLIZINE HCL 25 MG PO TABS: 25.0000 mg | ORAL_TABLET | Freq: Three times a day (TID) | ORAL | Status: DC | PRN

## 2019-10-26 MED ORDER — GEMFIBROZIL 600 MG PO TABS
600.0000 mg | ORAL_TABLET | Freq: Two times a day (BID) | ORAL | Status: DC
  Administered 2019-10-27 – 2019-10-30 (×8): 600 mg via ORAL
  Filled 2019-10-26 (×9): qty 1

## 2019-10-26 MED ORDER — HEPARIN SODIUM (PORCINE) 5000 UNIT/ML IJ SOLN
5000.0000 [IU] | Freq: Three times a day (TID) | INTRAMUSCULAR | Status: DC
  Administered 2019-10-26 – 2019-10-30 (×12): 5000 [IU] via SUBCUTANEOUS
  Filled 2019-10-26 (×12): qty 1

## 2019-10-26 MED ORDER — LEVOTHYROXINE SODIUM 50 MCG PO TABS
50.0000 ug | ORAL_TABLET | Freq: Every day | ORAL | Status: DC
  Administered 2019-10-27 – 2019-10-30 (×4): 50 ug via ORAL
  Filled 2019-10-26 (×4): qty 1

## 2019-10-26 NOTE — H&P (Signed)
Referring Physician: Gaynelle Arabian, MD  Alicia Sellers is an 85 y.o. female.                       Chief Complaint: Shortness of breath  HPI: 85 years old white female has one week history of progressive shortness of breath and leg edema. Her recent blood work by her primary care physician showed markedly elevated BNP over 5000. She has PMH of  HTN, CKD, hypothyroidism, moderate MR and anemia.   Past Medical History:  Diagnosis Date  . Acute on chronic left systolic heart failure ([place]) 10/26/2019  . Anemia   . Anxiety   . Arthritis   . Chronic kidney disease   . Dyspnea   . Headache   . Hypertension   . Hypothyroidism       Past Surgical History:  Procedure Laterality Date  . APPENDECTOMY    . BREAST BIOPSY Left 2015   benign  . EYE SURGERY    . TONSILLECTOMY      Family History  Problem Relation Age of Onset  . Breast cancer Neg Hx    Social History:  reports that she has never smoked. She has never used smokeless tobacco. She reports that she does not drink alcohol or use drugs.  Allergies:  Allergies  Allergen Reactions  . Latex Rash  . Penicillins Rash    DID THE REACTION INVOLVE: Swelling of the face/tongue/throat, SOB, or low BP? No Sudden or severe rash/hives, skin peeling, or the inside of the mouth or nose? No Did it require medical treatment? No When did it last happen? If all above answers are "NO", may proceed with cephalosporin use.    Medications Prior to Admission  Medication Sig Dispense Refill  . AMLODIPINE BENZOATE PO Take 5 mg by mouth daily.    Marland Kitchen aspirin 81 MG chewable tablet Chew 81 mg by mouth daily.    . Calcium Carbonate-Vitamin D (CALCIUM 500 + D) 500-125 MG-UNIT TABS Take 1 tablet by mouth 2 (two) times daily. Taking 600-400    . citalopram (CELEXA) 10 MG tablet Take 10 mg by mouth daily.    Marland Kitchen epoetin alfa-epbx (RETACRIT) 50932 UNIT/ML injection Inject 10,000 Units into the skin once a week.    . fexofenadine (ALLEGRA) 180 MG  tablet Take 180 mg by mouth daily.    Marland Kitchen gemfibrozil (LOPID) 600 MG tablet Take 600 mg by mouth 2 (two) times daily before a meal.    . hydrALAZINE (APRESOLINE) 25 MG tablet Take 25 mg by mouth 3 (three) times daily.    . Iron Combinations (IRON COMPLEX) CAPS Take 1 capsule by mouth daily.    Marland Kitchen levothyroxine (SYNTHROID, LEVOTHROID) 50 MCG tablet Take 50 mcg by mouth daily before breakfast.    . LORazepam (ATIVAN) 0.5 MG tablet Take 0.5 mg by mouth 2 (two) times daily.    . meclizine (ANTIVERT) 25 MG tablet Take 25 mg by mouth 3 (three) times daily as needed for dizziness.    . metaxalone (SKELAXIN) 800 MG tablet Take 800 mg by mouth 2 (two) times daily.    . metoprolol succinate (TOPROL-XL) 25 MG 24 hr tablet Take 25 mg by mouth daily.    . Multiple Vitamins-Minerals (MULTI COMPLETE) CAPS Take 1 tablet by mouth daily.    . Omega-3 Fatty Acids (FISH OIL) 1000 MG CAPS Take 1 capsule (1,000 mg total) by mouth 2 (two) times daily with a meal.  0  . Red Yeast  Rice 600 MG TABS Take 600 tablets by mouth 2 (two) times daily.    . traMADol (ULTRAM) 50 MG tablet Take by mouth every 6 (six) hours as needed.    . vitamin E 400 UNIT capsule Take 400 Units by mouth daily.      Results for orders placed or performed during the hospital encounter of 10/26/19 (from the past 48 hour(s))  CBC     Status: Abnormal   Collection Time: 10/26/19  7:12 PM  Result Value Ref Range   WBC 8.3 4.0 - 10.5 K/uL   RBC 2.76 (L) 3.87 - 5.11 MIL/uL   Hemoglobin 8.5 (L) 12.0 - 15.0 g/dL   HCT 25.3 (L) 36.0 - 46.0 %   MCV 91.7 80.0 - 100.0 fL   MCH 30.8 26.0 - 34.0 pg   MCHC 33.6 30.0 - 36.0 g/dL   RDW 16.7 (H) 11.5 - 15.5 %   Platelets 388 150 - 400 K/uL   nRBC 0.0 0.0 - 0.2 %    Comment: Performed at [facility], [place] [address], [place], [place] [address]   Dg Chest 2 View  Result Date: 10/25/2019 CLINICAL DATA:  Prolonged cough, weakness nowCOUGH EXAM: CHEST - 2 VIEW COMPARISON:  10/25/2019 FINDINGS: Stable  enlarged cardiac silhouette. No effusion, infiltrate or pneumothorax. Lungs are hyperinflated. Mild interstitial edema pattern. IMPRESSION: 1. Stable cardiomegaly and mild interstitial edema. Electronically Signed   By: Suzy Bouchard M.D.   On: 10/25/2019 16:12    Review Of Systems Constitutional: No fever, chills, weight loss or gain. Eyes: No vision change, wears glasses. No discharge or pain. Ears: No hearing loss, No tinnitus. Respiratory: No asthma, COPD, pneumonias. positive shortness of breath. No hemoptysis. Cardiovascular: No chest pain, palpitation, positive leg edema. Gastrointestinal: No nausea, vomiting, diarrhea, constipation. No GI bleed. No hepatitis. Genitourinary: No dysuria, hematuria, kidney stone. No incontinance. Neurological: No headache, stroke, seizures.  Psychiatry: No psych facility admission for anxiety, depression, suicide. No detox. Skin: No rash. Musculoskeletal: Positive joint pain, no fibromyalgia. No neck pain, back pain. Lymphadenopathy: No lymphadenopathy. Hematology: Positive anemia or easy bruising.   Blood pressure (!) 149/72, pulse 83, temperature 98.2 F (36.8 C), temperature source Oral, height 5\' 1"  (1.549 m), weight 57.4 kg, SpO2 91 %. Body mass index is 23.92 kg/m. General appearance: alert, cooperative, appears stated age and moderate distress Head: Normocephalic, atraumatic. Eyes: Blue eyes, pale conjunctiva, corneas clear. PERRL, EOM's intact. Neck: No adenopathy, no carotid bruit, positive JVD, supple, symmetrical, trachea midline and thyroid not enlarged. Resp: Clear to auscultation bilaterally. Cardio: Regular rate and rhythm, S1, S2 normal, II/VI systolic murmur, no click, rub or gallop GI: Soft, non-tender; bowel sounds normal; no organomegaly. Extremities: 2 + edema, no cyanosis or clubbing. Skin: Warm and dry.  Neurologic: Alert and oriented X 3, normal strength. Normal coordination and slow gait.  Assessment/Plan Acute on  chronic systolic left heart failure CKD, IV HTN Hypothyroidism Anemia of chronic disese  Admit. IV lasix. Echocardiogram, CXR, EKG. Home medications.  Time spent: Review of old records, Lab, x-rays, EKG, other cardiac tests, examination, discussion with patient over 70 minutes.  Birdie Riddle, MD  10/26/2019, 7:59 PM

## 2019-10-27 ENCOUNTER — Inpatient Hospital Stay (HOSPITAL_COMMUNITY): Payer: Medicare HMO

## 2019-10-27 LAB — COMPREHENSIVE METABOLIC PANEL
ALT: 28 U/L (ref 0–44)
AST: 26 U/L (ref 15–41)
Albumin: 2.5 g/dL — ABNORMAL LOW (ref 3.5–5.0)
Alkaline Phosphatase: 84 U/L (ref 38–126)
Anion gap: 14 (ref 5–15)
BUN: 94 mg/dL — ABNORMAL HIGH (ref 8–23)
CO2: 19 mmol/L — ABNORMAL LOW (ref 22–32)
Calcium: 9.4 mg/dL (ref 8.9–10.3)
Chloride: 107 mmol/L (ref 98–111)
Creatinine, Ser: 4.58 mg/dL — ABNORMAL HIGH (ref 0.44–1.00)
GFR calc Af Amer: 9 mL/min — ABNORMAL LOW (ref >60)
GFR calc non Af Amer: 8 mL/min — ABNORMAL LOW (ref >60)
Glucose, Bld: 92 mg/dL (ref 70–99)
Potassium: 4.8 mmol/L (ref 3.5–5.1)
Sodium: 140 mmol/L (ref 135–145)
Total Bilirubin: 1.7 mg/dL — ABNORMAL HIGH (ref 0.3–1.2)
Total Protein: 5.8 g/dL — ABNORMAL LOW (ref 6.5–8.1)

## 2019-10-27 LAB — ECHOCARDIOGRAM COMPLETE
Height: 61 in
Weight: 1968.27 oz

## 2019-10-27 LAB — TSH: TSH: 2.048 u[IU]/mL (ref 0.350–4.500)

## 2019-10-27 LAB — CBC WITH DIFFERENTIAL/PLATELET
Abs Immature Granulocytes: 0.05 10*3/uL (ref 0.00–0.07)
Basophils Absolute: 0 10*3/uL (ref 0.0–0.1)
Basophils Relative: 1 %
Eosinophils Absolute: 0.6 10*3/uL — ABNORMAL HIGH (ref 0.0–0.5)
Eosinophils Relative: 9 %
HCT: 25.1 % — ABNORMAL LOW (ref 36.0–46.0)
Hemoglobin: 8.6 g/dL — ABNORMAL LOW (ref 12.0–15.0)
Immature Granulocytes: 1 %
Lymphocytes Relative: 12 %
Lymphs Abs: 0.8 10*3/uL (ref 0.7–4.0)
MCH: 31 pg (ref 26.0–34.0)
MCHC: 34.3 g/dL (ref 30.0–36.0)
MCV: 90.6 fL (ref 80.0–100.0)
Monocytes Absolute: 0.5 10*3/uL (ref 0.1–1.0)
Monocytes Relative: 8 %
Neutro Abs: 4.7 10*3/uL (ref 1.7–7.7)
Neutrophils Relative %: 69 %
Platelets: 368 10*3/uL (ref 150–400)
RBC: 2.77 MIL/uL — ABNORMAL LOW (ref 3.87–5.11)
RDW: 16.6 % — ABNORMAL HIGH (ref 11.5–15.5)
WBC: 6.8 10*3/uL (ref 4.0–10.5)
nRBC: 0 % (ref 0.0–0.2)

## 2019-10-27 LAB — SARS CORONAVIRUS 2 (TAT 6-24 HRS): SARS Coronavirus 2: NEGATIVE

## 2019-10-27 LAB — BRAIN NATRIURETIC PEPTIDE: B Natriuretic Peptide: >4500 pg/mL — ABNORMAL HIGH (ref 0.0–100.0)

## 2019-10-27 MED ORDER — DOPAMINE-DEXTROSE 3.2-5 MG/ML-% IV SOLN
1.2500 ug/kg/min | INTRAVENOUS | Status: DC
  Administered 2019-10-27: 2.5 ug/kg/min via INTRAVENOUS
  Filled 2019-10-27: qty 250

## 2019-10-27 MED ORDER — FUROSEMIDE 10 MG/ML IJ SOLN
40.0000 mg | Freq: Every day | INTRAMUSCULAR | Status: DC
  Administered 2019-10-28 – 2019-10-29 (×2): 40 mg via INTRAVENOUS
  Filled 2019-10-27 (×2): qty 4

## 2019-10-27 NOTE — Progress Notes (Signed)
  Echocardiogram 2D Echocardiogram has been performed.  Alicia Sellers 10/27/2019, 12:33 PM

## 2019-10-27 NOTE — Progress Notes (Signed)
Ref: Gaynelle Arabian, MD   Subjective:  Respiratory distress continues. Creatinine is 4.58. Poor LV systolic function with severe TR.  Objective:  Vital Signs in the last 24 hours: Temp:  [98 F (36.7 C)-98.6 F (37 C)] 98 F (36.7 C) (11/20 0512) Pulse Rate:  [77-83] 77 (11/20 0512) Cardiac Rhythm: Normal sinus rhythm (11/20 0831) Resp:  [20] 20 (11/20 0512) BP: (126-149)/(72-80) 126/74 (11/20 0512) SpO2:  [91 %-96 %] 93 % (11/20 0512) Weight:  [55.8 kg-57.4 kg] 55.8 kg (11/20 0514)  Physical Exam: BP Readings from Last 1 Encounters:  10/27/19 126/74     Wt Readings from Last 1 Encounters:  10/27/19 55.8 kg    Weight change:  Body mass index is 23.24 kg/m. HEENT: [place]/AT, Eyes-Blue, wears glasses, Conjunctiva-Pale, Sclera-Non-icteric Neck: Positive JVD, No bruit, Trachea midline. Lungs:  Clearing with basal crackles, Bilateral. Cardiac:  Regular rhythm, normal S1 and S2, no S3. II/VI systolic murmur. Abdomen:  Soft, non-tender. BS present. Extremities:  1 + edema present. No cyanosis. No clubbing. CNS: AxOx3, Cranial nerves grossly intact, moves all 4 extremities.  Skin: Warm and dry.   Intake/Output from previous day: 11/19 0701 - 11/20 0700 In: 120 [P.O.:120] Out: [place] [Urine:625]    Lab Results: BMET    Component Value Date/Time   NA 140 10/27/2019 0413   NA 136 12/05/2018 0251   NA 134 (L) 12/04/2018 0241   K 4.8 10/27/2019 0413   K 4.3 12/05/2018 0251   K 3.9 12/04/2018 0241   CL 107 10/27/2019 0413   CL 106 12/05/2018 0251   CL 103 12/04/2018 0241   CO2 19 (L) 10/27/2019 0413   CO2 18 (L) 12/05/2018 0251   CO2 18 (L) 12/04/2018 0241   GLUCOSE 92 10/27/2019 0413   GLUCOSE 84 12/05/2018 0251   GLUCOSE 81 12/04/2018 0241   BUN 94 (H) 10/27/2019 0413   BUN 34 (H) 12/05/2018 0251   BUN 34 (H) 12/04/2018 0241   CREATININE 4.58 (H) 10/27/2019 0413   CREATININE 4.27 (H) 10/26/2019 1912   CREATININE 2.65 (H) 12/05/2018 0251   CALCIUM 9.4 10/27/2019  0413   CALCIUM 8.5 (L) 12/05/2018 0251   CALCIUM 8.1 (L) 12/04/2018 0241   GFRNONAA 8 (L) 10/27/2019 0413   GFRNONAA 9 (L) 10/26/2019 1912   GFRNONAA 16 (L) 12/05/2018 0251   GFRAA 9 (L) 10/27/2019 0413   GFRAA 10 (L) 10/26/2019 1912   GFRAA 18 (L) 12/05/2018 0251   CBC    Component Value Date/Time   WBC 6.8 10/27/2019 0413   RBC 2.77 (L) 10/27/2019 0413   HGB 8.6 (L) 10/27/2019 0413   HCT 25.1 (L) 10/27/2019 0413   PLT 368 10/27/2019 0413   MCV 90.6 10/27/2019 0413   MCH 31.0 10/27/2019 0413   MCHC 34.3 10/27/2019 0413   RDW 16.6 (H) 10/27/2019 0413   LYMPHSABS 0.8 10/27/2019 0413   MONOABS 0.5 10/27/2019 0413   EOSABS 0.6 (H) 10/27/2019 0413   BASOSABS 0.0 10/27/2019 0413   HEPATIC Function Panel Recent Labs    12/01/18 1014 10/27/19 0413  PROT 6.8 5.8*   HEMOGLOBIN A1C No components found for: HGA1C,  MPG CARDIAC ENZYMES Lab Results  Component Value Date   TROPONINI <0.03 12/01/2018   BNP No results for input(s): PROBNP in the last 8760 hours. TSH Recent Labs    12/01/18 1501 10/27/19 0413  TSH 2.769 2.048   CHOLESTEROL No results for input(s): CHOL in the last 8760 hours.  Scheduled Meds: . aspirin  81 mg Oral Daily  . citalopram  10 mg Oral Daily  . [START ON 10/28/2019] furosemide  40 mg Intravenous Daily  . gemfibrozil  600 mg Oral BID AC  . heparin  5,000 Units Subcutaneous Q8H  . levothyroxine  50 mcg Oral QAC breakfast  . multivitamin with minerals  1 tablet Oral Daily  . sodium chloride flush  3 mL Intravenous Q12H   Continuous Infusions: . sodium chloride 250 mL (10/27/19 1558)  . DOPamine 2.5 mcg/kg/min (10/27/19 1555)   PRN Meds:.sodium chloride, acetaminophen, meclizine, ondansetron (ZOFRAN) IV, sodium chloride flush  Assessment/Plan: Acute on chronic systolic left heart failure Acute on chronic kidney disease HTN Hypothyroidism Anemia of chronic disease  Discussed treatment options. High risk cardiac cath v/s medical  therapy. Will add 2.5 mcg. Dopamine drip to decrease lasix use. Patient prefers medical treatment for now. Check potassium and magnesium levels in AM with lipid panel and creatinine.   LOS: 1 day   Time spent including chart review, lab review, examination, discussion with patient and her daughter and granddaughter : 6  min   [name]  MD  10/27/2019, 5:23 PM

## 2019-10-27 NOTE — Plan of Care (Signed)
  Problem: Clinical Measurements: Goal: Respiratory complications will improve Outcome: Progressing Note: No s/s of respiratory complications noted.  Stable on room air. Goal: Cardiovascular complication will be avoided Outcome: Progressing Note: No s/s of cardiovascular complication noted.  NSR on telemetry.

## 2019-10-28 LAB — CBC
HCT: 26.2 % — ABNORMAL LOW (ref 36.0–46.0)
Hemoglobin: 8.7 g/dL — ABNORMAL LOW (ref 12.0–15.0)
MCH: 30.4 pg (ref 26.0–34.0)
MCHC: 33.2 g/dL (ref 30.0–36.0)
MCV: 91.6 fL (ref 80.0–100.0)
Platelets: 378 10*3/uL (ref 150–400)
RBC: 2.86 MIL/uL — ABNORMAL LOW (ref 3.87–5.11)
RDW: 17.1 % — ABNORMAL HIGH (ref 11.5–15.5)
WBC: 7.2 10*3/uL (ref 4.0–10.5)
nRBC: 0 % (ref 0.0–0.2)

## 2019-10-28 LAB — LIPID PANEL
Cholesterol: 127 mg/dL (ref 0–200)
HDL: 27 mg/dL — ABNORMAL LOW (ref >40)
LDL Cholesterol: 88 mg/dL (ref 0–99)
Total CHOL/HDL Ratio: 4.7 RATIO
Triglycerides: 58 mg/dL (ref <150)
VLDL: 12 mg/dL (ref 0–40)

## 2019-10-28 LAB — MAGNESIUM: Magnesium: 2.3 mg/dL (ref 1.7–2.4)

## 2019-10-28 LAB — BASIC METABOLIC PANEL
Anion gap: 12 (ref 5–15)
BUN: 91 mg/dL — ABNORMAL HIGH (ref 8–23)
CO2: 19 mmol/L — ABNORMAL LOW (ref 22–32)
Calcium: 9.1 mg/dL (ref 8.9–10.3)
Chloride: 105 mmol/L (ref 98–111)
Creatinine, Ser: 4.39 mg/dL — ABNORMAL HIGH (ref 0.44–1.00)
GFR calc Af Amer: 10 mL/min — ABNORMAL LOW (ref >60)
GFR calc non Af Amer: 8 mL/min — ABNORMAL LOW (ref >60)
Glucose, Bld: 92 mg/dL (ref 70–99)
Potassium: 4.8 mmol/L (ref 3.5–5.1)
Sodium: 136 mmol/L (ref 135–145)

## 2019-10-28 MED ORDER — ISOSORB DINITRATE-HYDRALAZINE 20-37.5 MG PO TABS
0.5000 | ORAL_TABLET | Freq: Two times a day (BID) | ORAL | Status: DC
  Administered 2019-10-28 – 2019-10-30 (×5): 0.5 via ORAL
  Filled 2019-10-28 (×5): qty 1

## 2019-10-28 MED ORDER — SODIUM BICARBONATE 650 MG PO TABS
650.0000 mg | ORAL_TABLET | Freq: Two times a day (BID) | ORAL | Status: DC
  Administered 2019-10-28 – 2019-10-30 (×5): 650 mg via ORAL
  Filled 2019-10-28 (×5): qty 1

## 2019-10-28 NOTE — Progress Notes (Signed)
Subjective:  Patient denies any chest pain breathing slowly improving remains on low-dose dopamine  Objective:  Vital Signs in the last 24 hours: Temp:  [98 F (36.7 C)-98.7 F (37.1 C)] 98.7 F (37.1 C) (11/21 0700) Pulse Rate:  [99-108] 108 (11/21 0700) BP: (129-153)/(60-74) 153/74 (11/21 0700) SpO2:  [94 %] 94 % (11/21 0700) Weight:  [55.6 kg] 55.6 kg (11/21 0700)  Intake/Output from previous day: 11/20 0701 - 11/21 0700 In: -  Out: 1350 [Urine:1350] Intake/Output from this shift: No intake/output data recorded.  Physical Exam: Neck: no adenopathy, no carotid bruit, supple, symmetrical, trachea midline and Positive for JVP Lungs: Bibasilar Rales noted Heart: regular rate and rhythm, S1, S2 normal and 2/6 systolic murmur and S3 gallop noted Abdomen: soft, non-tender; bowel sounds normal; no masses,  no organomegaly Extremities: No clubbing cyanosis 1+ edema noted  Lab Results: Recent Labs    10/27/19 0413 10/28/19 0302  WBC 6.8 7.2  HGB 8.6* 8.7*  PLT 368 378   Recent Labs    10/27/19 0413 10/28/19 0302  NA 140 136  K 4.8 4.8  CL 107 105  CO2 19* 19*  GLUCOSE 92 92  BUN 94* 91*  CREATININE 4.58* 4.39*   No results for input(s): TROPONINI in the last 72 hours.  Invalid input(s): CK, MB Hepatic Function Panel Recent Labs    10/27/19 0413  PROT 5.8*  ALBUMIN 2.5*  AST 26  ALT 28  ALKPHOS 84  BILITOT 1.7*   Recent Labs    10/28/19 0302  CHOL 127   No results for input(s): PROTIME in the last 72 hours.  Imaging: Imaging results have been reviewed and Dg Chest 2 View  Result Date: 10/27/2019 CLINICAL DATA:  85 year old female with a history of congestive heart failure EXAM: CHEST - 2 VIEW COMPARISON:  10/25/2019 FINDINGS: Cardiomediastinal silhouette unchanged, with cardiomegaly. Interlobular septal thickening. No pneumothorax. No large pleural effusion. No confluent airspace disease. IMPRESSION: Acute CHF, similar to the comparison.  Electronically Signed   By: Corrie Mckusick D.O.   On: 10/27/2019 08:43    Cardiac Studies:  Assessment/Plan:  Resolving acute on chronic systolic left heart failure Dilated cardiomyopathy Valvular heart disease Acute on chronic kidney disease HTN Hypothyroidism Anemia of chronic disease  Metabolic acidosis Plan Add low-dose BiDil as per orders Add sodium bicarb as per orders   LOS: 2 days    Charolette Forward 10/28/2019, 11:01 AM

## 2019-10-29 LAB — BASIC METABOLIC PANEL
Anion gap: 15 (ref 5–15)
BUN: 90 mg/dL — ABNORMAL HIGH (ref 8–23)
CO2: 20 mmol/L — ABNORMAL LOW (ref 22–32)
Calcium: 9.4 mg/dL (ref 8.9–10.3)
Chloride: 104 mmol/L (ref 98–111)
Creatinine, Ser: 4.08 mg/dL — ABNORMAL HIGH (ref 0.44–1.00)
GFR calc Af Amer: 11 mL/min — ABNORMAL LOW (ref >60)
GFR calc non Af Amer: 9 mL/min — ABNORMAL LOW (ref >60)
Glucose, Bld: 92 mg/dL (ref 70–99)
Potassium: 4.9 mmol/L (ref 3.5–5.1)
Sodium: 139 mmol/L (ref 135–145)

## 2019-10-29 MED ORDER — POLYETHYLENE GLYCOL 3350 17 G PO PACK
17.0000 g | PACK | Freq: Every day | ORAL | Status: DC
  Administered 2019-10-29 – 2019-10-30 (×2): 17 g via ORAL
  Filled 2019-10-29: qty 1

## 2019-10-29 MED ORDER — METOPROLOL TARTRATE 25 MG PO TABS
25.0000 mg | ORAL_TABLET | Freq: Two times a day (BID) | ORAL | Status: DC
  Administered 2019-10-29: 25 mg via ORAL
  Filled 2019-10-29 (×2): qty 1

## 2019-10-29 NOTE — Progress Notes (Signed)
Patient's heart rate was sustaining in the 120-130's. Patient complained of a post nasal drainage, sore throat, & nausea. Patient stated that she felt worse than she did before; takes metoprolol at home. MD notified, will continue to monitor. 12-lead EKG obtained with no acute changes noted.  Elaina Hoops, RN

## 2019-10-29 NOTE — Progress Notes (Signed)
Subjective:  Patient denies any chest pain, no shortness of breath.  Complains of nausea.  Earlier had an episode of sinus tachycardia on dopamine which has been DC'd.  Home metoprolol was restarted early this morning Complains of constipation.  Renal function slowly improving Objective:  Vital Signs in the last 24 hours: Temp:  [97.6 F (36.4 C)-97.7 F (36.5 C)] 97.7 F (36.5 C) (11/22 0528) Pulse Rate:  [101-106] 101 (11/22 0528) Resp:  [16-20] 20 (11/22 0528) BP: (115-136)/(70-76) 136/70 (11/22 0528) SpO2:  [92 %-99 %] 92 % (11/22 0528) Weight:  [55 kg] 55 kg (11/22 0528)  Intake/Output from previous day: 11/21 0701 - 11/22 0700 In: 571.7 [P.O.:200; I.V.:371.7] Out: 600 [Urine:600] Intake/Output from this shift: No intake/output data recorded.  Physical Exam: Neck: no adenopathy, no carotid bruit, no JVD and supple, symmetrical, trachea midline Lungs: faint bibasilar rales noted Heart: regular rate and rhythm, S1, S2 normal and 2/6 systolic murmur noted Abdomen: soft, non-tender; bowel sounds normal; no masses,  no organomegaly Extremities: extremities normal, atraumatic, no cyanosis or edema  Lab Results: Recent Labs    10/27/19 0413 10/28/19 0302  WBC 6.8 7.2  HGB 8.6* 8.7*  PLT 368 378   Recent Labs    10/28/19 0302 10/29/19 0333  NA 136 139  K 4.8 4.9  CL 105 104  CO2 19* 20*  GLUCOSE 92 92  BUN 91* 90*  CREATININE 4.39* 4.08*   No results for input(s): TROPONINI in the last 72 hours.  Invalid input(s): CK, MB Hepatic Function Panel Recent Labs    10/27/19 0413  PROT 5.8*  ALBUMIN 2.5*  AST 26  ALT 28  ALKPHOS 84  BILITOT 1.7*   Recent Labs    10/28/19 0302  CHOL 127   No results for input(s): PROTIME in the last 72 hours.  Imaging: Imaging results have been reviewed and No results found.  Cardiac Studies:  Assessment/Plan:  Resolving acute on chronic systolic left heart failure Dilated cardiomyopathy Valvular heart disease Acute  on chronic kidney disease HTN Hypothyroidism Anemia of chronic disease  Metabolic acidosis Plan DC dopamine. Start metoprolol as per orders. Rx or constipation  LOS: 3 days    Charolette Forward 10/29/2019, 10:26 AM

## 2019-10-30 LAB — BASIC METABOLIC PANEL
Anion gap: 15 (ref 5–15)
BUN: 94 mg/dL — ABNORMAL HIGH (ref 8–23)
CO2: 21 mmol/L — ABNORMAL LOW (ref 22–32)
Calcium: 9.3 mg/dL (ref 8.9–10.3)
Chloride: 104 mmol/L (ref 98–111)
Creatinine, Ser: 4.4 mg/dL — ABNORMAL HIGH (ref 0.44–1.00)
GFR calc Af Amer: 10 mL/min — ABNORMAL LOW (ref >60)
GFR calc non Af Amer: 8 mL/min — ABNORMAL LOW (ref >60)
Glucose, Bld: 91 mg/dL (ref 70–99)
Potassium: 4.8 mmol/L (ref 3.5–5.1)
Sodium: 140 mmol/L (ref 135–145)

## 2019-10-30 MED ORDER — EPOETIN ALFA-EPBX 10000 UNIT/ML IJ SOLN: 10000.0000 [IU] | Freq: Once | INTRAMUSCULAR | Status: DC

## 2019-10-30 MED ORDER — DARBEPOETIN ALFA 60 MCG/0.3ML IJ SOSY
60.0000 ug | PREFILLED_SYRINGE | Freq: Once | INTRAMUSCULAR | Status: AC
  Administered 2019-10-30: 60 ug via SUBCUTANEOUS
  Filled 2019-10-30: qty 0.3

## 2019-10-30 MED ORDER — METOPROLOL TARTRATE 12.5 MG HALF TABLET
12.5000 mg | ORAL_TABLET | Freq: Every day | ORAL | Status: DC
  Administered 2019-10-30: 12.5 mg via ORAL
  Filled 2019-10-30: qty 1

## 2019-10-30 MED ORDER — POLYETHYLENE GLYCOL 3350 17 G PO PACK: 17.0000 g | PACK | Freq: Every day | ORAL | 3 refills | Status: DC

## 2019-10-30 MED ORDER — AMLODIPINE BESYLATE 5 MG PO TABS: 2.5000 mg | ORAL_TABLET | Freq: Every day | ORAL | 2 refills | Status: DC

## 2019-10-30 MED ORDER — FUROSEMIDE 40 MG PO TABS: 40.0000 mg | ORAL_TABLET | Freq: Every day | ORAL | 3 refills | Status: DC

## 2019-10-30 MED ORDER — FUROSEMIDE 40 MG PO TABS
40.0000 mg | ORAL_TABLET | Freq: Every day | ORAL | Status: DC
  Administered 2019-10-30: 40 mg via ORAL
  Filled 2019-10-30: qty 1

## 2019-10-30 NOTE — Care Management Important Message (Signed)
Important Message  Patient Details  Name: Erminia Mcnew MRN: [id number] Date of Birth: [birth year]   Medicare Important Message Given:  Yes     Milda, [place] 10/30/2019, 12:12 PM

## 2019-10-30 NOTE — Discharge Instructions (Signed)
Low Sodium Nutrition Therapy  °Eating less sodium can help you if you have high blood pressure, heart failure, or kidney or liver disease.  ° °Your body needs a little sodium, but too much sodium can cause your body to hold onto extra water. This extra water will raise your blood pressure and can cause damage to your heart, kidneys, or liver as they are forced to work harder.  ° °Sometimes you can see how the extra fluid affects you because your hands, legs, or belly swell. You may also hold water around your heart and lungs, which makes it hard to breathe.  ° °Even if you take medication for blood pressure or a water pill (diuretic) to remove fluid, it is still important to have less salt in your diet.  ° °Check with your primary care provider before drinking alcohol since it may affect the amount of fluid in your body and how your heart, kidneys, or liver work. °Sodium in Food °A low-sodium meal plan limits the sodium that you get from food and beverages to 1,500-2,000 milligrams (mg) per day. Salt is the main source of sodium. Read the nutrition label on the package to find out how much sodium is in one serving of a food.  °• Select foods with 140 milligrams (mg) of sodium or less per serving.  °• You may be able to eat one or two servings of foods with a little more than 140 milligrams (mg) of sodium if you are closely watching how much sodium you eat in a day.  °• Check the serving size on the label. The amount of sodium listed on the label shows the amount in one serving of the food. So, if you eat more than one serving, you will get more sodium than the amount listed. ° °Tips °Cutting Back on Sodium °• Eat more fresh foods.  °• Fresh fruits and vegetables are low in sodium, as well as frozen vegetables and fruits that have no added juices or sauces.  °• Fresh meats are lower in sodium than processed meats, such as bacon, sausage, and hotdogs.  °• Not all processed foods are unhealthy, but some processed foods  may have too much sodium.  °• Eat less salt at the table and when cooking. One of the ingredients in salt is sodium.  °• One teaspoon of table salt has 2,300 milligrams of sodium.  °• Leave the salt out of recipes for pasta, casseroles, and soups. °• Be a smart shopper.  °• Food packages that say “Salt-free”, sodium-free”, “very low sodium,” and “low sodium” have less than 140 milligrams of sodium per serving.  °• Beware of products identified as “Unsalted,” “No Salt Added,” “Reduced Sodium,” or “Lower Sodium.” These items may still be high in sodium. You should always check the nutrition label. °• Add flavors to your food without adding sodium.  °• Try lemon juice, lime juice, or vinegar.  °• Dry or fresh herbs add flavor.  °• Buy a sodium-free seasoning blend or make your own at home. °• You can purchase salt-free or sodium-free condiments like barbeque sauce in stores and online. Ask your registered dietitian nutritionist for recommendations and where to find them.  °•  °Eating in Restaurants °• Choose foods carefully when you eat outside your home. Restaurant foods can be very high in sodium. Many restaurants provide nutrition facts on their menus or their websites. If you cannot find that information, ask your server. Let your server know that you want your food   to be cooked without salt and that you would like your salad dressing and sauces to be served on the side.      Foods Recommended  Food Group  Foods Recommended   Grains  Bread, bagels, rolls without salted tops Homemade bread made with reduced-sodium baking powder Cold cereals, especially shredded wheat and puffed rice Oats, grits, or cream of wheat Pastas, quinoa, and rice Popcorn, pretzels or crackers without salt Corn tortillas   Protein Foods  Fresh meats and fish; [place] bacon (check the nutrition labels - make sure they are not packaged in a sodium solution) Canned or packed tuna (no more than 4 ounces at 1 serving) Beans  and peas Soybeans) and tofu Eggs Nuts or nut butters without salt   Dairy  Milk or milk powder Plant milks, such as rice and soy Yogurt, including Greek yogurt Small amounts of natural cheese (blocks of cheese) or reduced-sodium cheese can be used in moderation. (Swiss, ricotta, and fresh mozzarella cheese are lower in sodium than the others) Cream Cheese Low sodium cottage cheese   Vegetables  Fresh and frozen vegetables without added sauces or salt Homemade soups (without salt) Low-sodium, salt-free or sodium-free canned vegetables and soups   Fruit  Fresh and canned fruits Dried fruits, such as raisins, cranberries, and prunes   Oils  Tub or liquid margarine, regular or without salt Canola, corn, peanut, olive, safflower, or sunflower oils   Condiments  Fresh or dried herbs such as basil, bay leaf, dill, mustard (dry), nutmeg, paprika, parsley, rosemary, sage, or thyme.  Low sodium ketchup Vinegar  Lemon or lime juice Pepper, red pepper flakes, and cayenne. Hot sauce contains sodium, but if you use just a drop or two, it will not add up to much.  Salt-free or sodium-free seasoning mixes and marinades Simple salad dressings: vinegar and oil     Foods Not Recommended  Food Group  Foods Not Recommended   Grains  Breads or crackers topped with salt Cereals (hot/cold) with more than 300 mg sodium per serving Biscuits, cornbread, and other quick breads prepared with baking soda Pre-packaged bread crumbs Seasoned and packaged rice and pasta mixes Self-rising flours   Protein Foods  Cured meats: Bacon, ham, sausage, pepperoni and hot dogs Canned meats (chili, vienna sausage, or sardines) Smoked fish and meats Frozen meals that have more than 600 mg of sodium per serving Egg substitute (with added sodium)   Dairy  Buttermilk Processed cheese spreads Cottage cheese (1 cup may have over 500 mg of sodium; look for low-sodium.) American or feta cheese Shredded  Cheese has more sodium than blocks of cheese String cheese   Vegetables  Canned vegetables (unless they are salt-free, sodium-free or low sodium) Frozen vegetables with seasoning and sauces Sauerkraut and pickled vegetables Canned or dried soups (unless they are salt-free, sodium-free, or low sodium) [place] fries and onion rings   Fruit  Dried fruits preserved with additives that have sodium   Oils  Salted butter or margarine, all types of olives   Condiments  Salt, sea salt, kosher salt, onion salt, and garlic salt Seasoning mixes with salt Bouillon cubes Ketchup Barbeque sauce and Worcestershire sauce unless low sodium Soy sauce Salsa, pickles, olives, relish Salad dressings: ranch, blue cheese, [place], and [place].     Low Sodium Sample 1-Day Menu   Breakfast  1 cup cooked oatmeal   1 slice whole wheat bread toast   1 tablespoon peanut butter without salt   1 banana  1 cup 1% milk   Lunch  Tacos made with: 2 corn tortillas    cup black beans, low sodium    cup roasted or grilled chicken (without skin)    avocado   Squeeze of lime juice   1 cup salad greens   1 tablespoon low-sodium salad dressing    cup strawberries   1 orange   Afternoon Snack  1/3 cup grapes   6 ounces yogurt   Evening Meal  3 ounces herb-baked fish   1 baked potato   2 teaspoons olive oil    cup cooked carrots   2 thick slices tomatoes on:   2 lettuce leaves   1 teaspoon olive oil   1 teaspoon balsamic vinegar   1 cup 1% milk   Evening Snack  1 apple    cup almonds without salt     Low-Sodium Vegetarian (Lacto-Ovo) Sample 1-Day Menu   Breakfast  1 cup cooked oatmeal   1 slice whole wheat toast   1 tablespoon peanut butter without salt   1 banana   1 cup 1% milk   Lunch  Tacos made with: 2 corn tortillas    cup black beans, low sodium    cup roasted or grilled chicken (without skin)    avocado   Squeeze of lime juice   1  cup salad greens   1 tablespoon low-sodium salad dressing    cup strawberries   1 orange   Evening Meal  Stir fry made with:  cup tofu   1 cup brown rice    cup broccoli    cup green beans    cup peppers    tablespoon peanut oil   1 orange   1 cup 1% milk   Evening Snack  4 strips celery   2 tablespoons hummus   1 hard-boiled egg     Low-Sodium Vegan Sample 1-Day Menu   Breakfast  1 cup cooked oatmeal   1 tablespoon peanut butter without salt   1 cup blueberries   1 cup soymilk fortified with calcium, vitamin B12, and vitamin D   Lunch  1 small whole wheat pita    cup cooked lentils   2 tablespoons hummus   4 carrot sticks   1 medium apple   1 cup soymilk fortified with calcium, vitamin B12, and vitamin D   Evening Meal  Stir fry made with:  cup tofu   1 cup brown rice    cup broccoli    cup green beans    cup peppers    tablespoon peanut oil   1 cup cantaloupe   Evening Snack  1 cup soy yogurt    cup mixed nuts   Copyright 2020  [employer]y [employer]. All rights reserved    Sodium Free Flavoring Tips    When cooking, the following items may be used for flavoring instead of salt or seasonings that contain sodium.  Remember: A little bit of spice goes a long way! Be careful not to overseason.  Spice Blend Recipe (makes about ? cup)  5 teaspoons onion powder   2 teaspoons garlic powder   2 teaspoons paprika   2 teaspoon dry mustard   1 teaspoon crushed thyme leaves    teaspoon white pepper    teaspoon celery seed Food Item Flavorings  Beef Basil, bay leaf, caraway, curry, dill, dry mustard, garlic, grape jelly, green pepper, mace, marjoram, mushrooms (fresh), nutmeg, onion or onion powder, parsley, pepper,  rosemary, sage  Chicken Basil, cloves, cranberries, mace, mushrooms (fresh), nutmeg, oregano, paprika, parsley, pineapple, saffron, sage, savory, tarragon, thyme, tomato,  turmeric  Egg Chervil, curry, dill, dry mustard, garlic or garlic powder, green pepper, jelly, mushrooms (fresh), nutmeg, onion powder, paprika, parsley, rosemary, tarragon, tomato  Fish Basil, bay leaf, chervil, curry, dill, dry mustard, green pepper, lemon juice, marjoram, mushrooms (fresh), paprika, pepper, tarragon, tomato, turmeric  Lamb Cloves, curry, dill, garlic or garlic powder, mace, mint, mint jelly, onion, oregano, parsley, pineapple, rosemary, tarragon, thyme  Pork Applesauce, basil, caraway, chives, cloves, garlic or garlic powder, onion or onion powder, rosemary, thyme  Veal Apricots, basil, bay leaf, currant jelly, curry, ginger, marjoram, mushrooms (fresh), oregano, paprika  Vegetables Basil, dill, garlic or garlic powder, ginger, lemon juice, mace, marjoram, nutmeg, onion or onion powder, tarragon, tomato, sugar or sugar substitute, salt-free salad dressing, vinegar  Desserts Allspice, anise, cinnamon, cloves, ginger, mace, nutmeg, vanilla extract, other extracts   Copyright 2020  Academy [employer]. All rights reserved  Fluid Restricted Nutrition Therapy  You have been prescribed this diet because your condition affects how much fluid you can eat or drink. If your heart, liver, or kidneys aren't working properly, you may not be able to effectively eliminate fluids from the body and this may cause swelling (edema) in the legs, arms, and/or stomach. Drink no more than _________ liters or ________ ounces or ________cups of fluid per day.   You don't need to stop eating or drinking the same fluids you normally would, but you may need to eat or drink less than usual.   Your registered dietitian nutritionist will help you determine the correct amount of fluid to consume during the day Breakfast Include fluids taken with medications  Lunch Include fluids taken with medications  Dinner Include fluids taken with medications  Bedtime Snack Include fluids taken with  medications     Tips What Are Fluids?  A fluid is anything that is liquid or anything that would melt if left at room temperature. You will need to count these foods and liquids--including any liquid used to take medication--as part of your daily fluid intake. Some examples are:  Alcohol (drink only with your doctor's permission)   Coffee, tea, and other hot beverages   Gelatin (Jell-O)   Gravy   Ice cream, sherbet, sorbet   Ice cubes, ice chips   Milk, liquid creamer   Nutritional supplements   Popsicles   Vegetable and fruit juices; fluid in canned fruit   Watermelon   Yogurt   Soft drinks, lemonade, limeade   Soups   Syrup How Do I Measure My Fluid Intake?  Record your fluid intake daily.   Tip: Every day, each time you eat or drink fluids, pour water in the same amount into an empty container that can hold the same amount of fluids you are allowed daily. This may help you keep track of how much fluid you are taking in throughout the day.   To accurately keep track of how much liquid you take in, measure the size of the cups, glasses, and bowls you use. If you eat soup, measure how much of it is liquid and how much is solid (such as noodles, vegetables, meat). Conversions for Measuring Fluid Intake  Milliliters (mL) Liters (L) Ounces (oz) Cups (c)  1000 1 32 4  1200 1.2 40 5  1500 1.5 50 6 1/4  1800 1.8 60 7 1/2  2000 2 67 8 1/3  Tips to Reduce Your Thirst  Chew gum or suck on hard candy.   Rinse or gargle with mouthwash. Do not swallow.   Ice chips or popsicles my help quench thirst, but this too needs to be calculated into the total restriction. Melt ice chips or cubes first to figure out how much fluid they produce (for example, experiment with melting  cup ice chips or 2 ice cubes).   Add a lemon wedge to your water.   Limit how much salt you take in. A high salt intake might make you thirstier.   Don't eat or drink all your allowed liquids at  once. Space your liquids out through the day.   Use small glasses and cups and sip slowly. If allowed, take your medications with fluids you eat or drink during a meal.   Fluid-Restricted Nutrition Therapy Sample 1-Day Menu  Breakfast 1 slice wheat toast  1 tablespoon peanut butter  1/2 cup yogurt (120 milliliters)  1/2 cup blueberries  1 cup milk (240 milliliters)   Lunch 3 ounces sliced [place]  2 slices whole wheat bread  1/2 cup lettuce for sandwich  2 slices tomato for sandwich  1 ounce reduced-fat, reduced-sodium cheese  1/2 cup fresh carrot sticks  1 banana  1 cup unsweetened tea (240 milliliters)   Evening Meal 8 ounces soup (240 milliliters)  3 ounces salmon  1/2 cup quinoa  1 cup green beans  1 cup mixed greens salad  1 tablespoon olive oil  1 cup coffee (240 milliliters)  Evening Snack 1/2 cup sliced peaches  1/2 cup frozen yogurt (120 milliliters)  1 cup water (240 milliliters)  Copyright 2020  Academy of [employer]. All rights reserved  Chronic Kidney Disease Stage 3-5 Nutrition Therapy  Choosing healthy food, staying physically active and taking medicines as prescribed by your health care provider may help slow down the progression of kidney disease. There is not one eating plan that is right for everyone with kidney disease. Your registered dietitian nutritionist (RDN) will help you identify whats best for you to eat. Why is nutrition important in kidney disease? Your kidneys help keep nutrients and minerals balanced in your body and remove the waste products from your blood. With kidney disease, your kidneys may not be able to do this job very well. You may need to make some changes to your diet. You may need to control the amount of protein, sodium, potassium, phosphorus or calcium in your diet. You will also still need to follow diet recommendations for any other conditions you have, like heart disease or diabetes. Fortunately, these diets  are similar. Your nutrition care plan might change over time depending on the status of your condition. Your registered dietitian nutritionist or health care provider will tell you if changes are needed based on your blood test results.  Nutrients to Monitor  You may need to pay attention to sodium, phosphorus, and potassium in your diet. Your RDN can provide you handouts on  potassium and/or phosphorus for more details and strategies to manage these nutrients.  Tips to limit sodium:  Eat home-cooked m  eals made from fresh ingredients.  Choose foods and condiments with 200 milligrams of sodium or less per serving.  Use frozen or packaged meals with 600 milligrams or less sodium per serving if you are too tired to cook.  Check labels to avoid foods that have more than 200 milligrams of sodium per serving. These foods may include  canned soups or  soup mixes, packaged foods, pickled foods, sauces, and seasonings.  Limit how much salt you add to foods or avoid it altogether. Salt-free seasonings like herbs, spices, lemon juice, and  vinegar will flavor to your food without adding salt.  Ask your RDN which frozen and convenience foods, fast foods, or restaurant meals may be ok for you.  If you also have diabetes and/or heart disease:  It is easy to manage these multiple diets because they are similar in many ways.  Eat a variety of healthy foods.  Choose whole grain foods.  Eat a moderate amount of protein and choose low-fat, lean, and heart-healthy options.  Eat at least 5 servings/day of fruits and vegetables. Your blood potassium level will affect which fruits and vegetables  you can safely eat.  Eat less food with added salt, sugars, and fats.  Your RDN can provide you with additional recommendations if necessary.  Chronic Kidney Disease Stage 3-5 Vegan Sample 1-Day Menu Breakfast 1 cup cooked oatmeal 1 slice whole wheat toast 2 teaspoons margarine, soft, tub   cup blueberries 1 cup soymilk 1 cup coffee Lunch 1 whole wheat pita bread  cup hummus 1 large apple  cup sliced cucumber Evening Meal Stir-fry made with:  cup tofu  cup mung beans 1 cup rice  cup cabbage  cup carrots 1 tablespoon peanut oil  cup sliced peaches 1 cup iced tea Evening Snack 1 cup soy yogurt 1 cup air-popped popcorn 1 tablespoon olive oil  Chronic Kidney Disease Stage 3-5 Vegetarian (Lacto-Ovo) Sample 1-Day Menu Breakfast 1 cup cooked oatmeal 1 slice whole wheat toast 2 teaspoons margarine, soft, tub 1 scrambled egg  cup blueberries  cup 1% milk 1 cup coffee Lunch 2 slices whole wheat bread 2 tablespoons peanut butter 1 cup lettuce  cup sliced cucumber 1 tablespoon oil and vinegar salad dressing 1 large apple Evening Meal Stir-fry made with:  cup tofu 1 cup rice  cup canned bamboo shoots  cup carrots 1 tablespoon peanut oil  cup sliced peaches 1 cup iced tea Evening Snack 1 cup air-popped popcorn 1 tablespoon olive oil

## 2019-10-30 NOTE — Progress Notes (Signed)
  Nutrition Education Note  RD consulted regarding patient having questions about heart healthy and renal diet.  Per patient, recently she was told by her renal MD to focus on increasing calories to help prevent weight loss.  Per weight records, pt has lost 8 lbs since December 2019.   Patient reports she already follows a low sodium diet and watches her potassium and phosphorus foods. Questions if she should increase her protein as this is limited on a renal diet. Encouraged pt to make sure she is consuming protein foods with every meal and snack. States she drinks 1 Ensure daily. Recommended she could increase this to twice daily for extra kcals and protein.  Spoke with pt's daughter who wondered if pt should switch to plant-based proteins. Will provide list of plant-based proteins pt can start to include in diet.   Discussed importance of protein intake at each meal and snack. Provided examples of how to maximize protein intake throughout the day. Discussed need for fluid restriction and how to still follow restriction with protein shakes.  Encouraged pt to discuss specific diet questions/concerns with renal MD if she needs exact restriction limits. Teach back method used.  Expect good compliance with family support.  Body mass index is 22.92 kg/m. Pt meets criteria for normal based on current BMI.  Current diet order is Heart Healthy.. Labs and medications reviewed. No further nutrition interventions warranted at this time.  If additional nutrition issues arise, please re-consult RD.   Clayton Bibles, MS, RD, LDN Inpatient Clinical Dietitian Pager: [phone] After Hours Pager: [phone]

## 2019-10-30 NOTE — Discharge Summary (Signed)
Physician Discharge Summary  Patient ID: Alicia Sellers MRN: [id number] DOB/AGE: Jan 12, 1929 85 y.o.  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 She had markedly elevated BNP of over 5000. She has PMH of HTN, CKD, IV, hypothyroidism, moderate MR and anemia of chronic disease. She responded to IV lasix but her creatinine was rising. IV dobutamine drip was tried for 48 hours until tachycardia developed and drip was discontinued. She was able to breath lying down flat in bed. Her echocardiogram showed LV (EF 21-30 %) and RV systolic dysfunction, LV diastolic dysfunction and moderate to severe MR and severe TR. She had nutritional consult for heart failure and chronic kidney disease worsening with diuretic use. Discussions were done on right and left heart catheterization as high risk procedure. Patient and family accepted medical therapy for now. She may undergo cardiac interventions if she becomes hemodialysis candidate in future. She understood to decrease her salt intake by about half to 1 gm. Sodium per day, Almost no potassium intake with 50 to 75 % reduction and decrease fluid intake by about half to 1200 cc/day. She will continue protein intake at current level and continue most home medications.  She will weigh herself daily and report more than 4 pounds of  weight gain in 1 week. She will wear support stocking if she has to walk or stay on feet for extended period of time. She should use cane or walker until she gets used to lower blood pressure. She will see primary care physician in 1 week and me and Dr. Justin Mend in about 2 weeks. .   Consults: cardiology  Significant Diagnostic Studies: labs: Normal WBC count and platelets count. Low Hgb of 8.5 g. Normal sodium, potassium and chloride but low CO2 of 19 and elevated Creatinine of 4.58. Iron level was low at 23 mcg on 10/18/2019.  BNP was over 4500 pg. TSH was 2.0 Lipid level was near normal except HDL was low at 27 mg.  EKG: SR, 1st degree AV block and RBBB with left anterior hemiblock.  Chest x-ray showed CHF.  Echocardiogram: Moderate RV and severe LV systolic dysfunction with mild LV diastolic dysfunction  Treatments: cardiac meds: metoprolol, amlodipine and furosemide.  Discharge Exam: Blood pressure 122/62, pulse 82, temperature 97.9 F (36.6 C), temperature source Oral, resp. rate 15, height 5\' 1"  (1.549 m), weight 55 kg, SpO2 96 %. General appearance: alert, cooperative and appears stated age. Head: Normocephalic, atraumatic. Eyes: Blue eyes, pale conjunctiva, corneas clear. PERRL, EOM's intact.  Neck: No adenopathy, no carotid bruit, no JVD, supple, symmetrical, trachea midline and thyroid not enlarged. Resp: Clearing to auscultation bilaterally. Cardio: Regular rate and rhythm, S1, S2 normal, II/VI systolic murmur, no click, rub or gallop. GI: Soft, non-tender; bowel sounds normal; no organomegaly. Extremities: 1+ lower leg edema, no cyanosis or clubbing. Skin: Warm and dry.  Neurologic: Alert and oriented X 3, normal  strength and tone. Normal coordination and slow gait.  Disposition: Discharge disposition: 01-Home or Self Care        Allergies as of 10/30/2019      Reactions   Latex Rash   Penicillins Rash   DID THE REACTION INVOLVE: Swelling of the  face/tongue/throat, SOB, or low BP? No Sudden or severe rash/hives, skin peeling, or the inside of the mouth or nose? No Did it require medical treatment? No When did it last happen? If all above answers are "NO", may proceed with cephalosporin use.      Medication List    TAKE these medications   amLODipine 5 MG tablet Commonly known as: NORVASC Take 0.5 tablets (2.5 mg total) by mouth daily. What changed: how much to take   aspirin 81 MG chewable tablet Chew 81 mg by mouth daily.   Calcium 500 + D 500-125 MG-UNIT Tabs Generic drug: Calcium Carbonate-Vitamin D Take 2 tablets by mouth daily.   citalopram 10 MG tablet Commonly known as: CELEXA Take 10 mg by mouth daily.   fexofenadine 180 MG tablet Commonly known as: ALLEGRA Take 180 mg by mouth daily as needed for allergies.   Fish Oil 1000 MG Caps Take 1 capsule (1,000 mg total) by mouth 2 (two) times daily with a meal.   furosemide 40 MG tablet Commonly known as: LASIX Take 1 tablet (40 mg total) by mouth daily. Start taking on: October 31, 2019   gemfibrozil 600 MG tablet Commonly known as: LOPID Take 600 mg by mouth 2 (two) times daily before a meal.   hydrALAZINE 25 MG tablet Commonly known as: APRESOLINE Take 25 mg by mouth 3 (three) times daily.   levothyroxine 50 MCG tablet Commonly known as: SYNTHROID Take 50 mcg by mouth daily before breakfast.   LORazepam 0.5 MG tablet Commonly known as: ATIVAN Take 0.25 mg by mouth at bedtime.   meclizine 25 MG tablet Commonly known as: ANTIVERT Take 25 mg by mouth 3 (three) times daily as needed for dizziness.   metoprolol succinate 25 MG 24 hr tablet Commonly known as: TOPROL-XL Take 12.5 mg by mouth daily.   Multi Complete Caps Take 1 tablet by mouth daily.   ondansetron 4 MG tablet Commonly known as: ZOFRAN Take 4 mg by mouth every 4 (four) hours as needed for nausea/vomiting.   polyethylene glycol 17 g packet Commonly known as: MIRALAX /  GLYCOLAX Take 17 g by mouth daily. Start taking on: October 31, 2019   Red Yeast Rice 600 MG Tabs Take 600 tablets by mouth 2 (two) times daily.   sodium bicarbonate 650 MG tablet Take 650 mg by mouth 2 (two) times daily.   vitamin E 400 UNIT capsule Take 400 Units by mouth daily.      Follow-up Information    Gaynelle Arabian, MD. Schedule an appointment as soon as possible for a visit in 1 week(s).   Specialty: Family Medicine Contact information: [address], [place] [address] [phone]        [name], MD. Schedule an appointment as soon as possible for a visit in 2 week(s).   Specialty: Cardiology Contact information: [place] [address] [phone]        Edrick Oh, MD. Schedule an appointment as soon as possible for a visit in 2 week(s).   Specialty: Nephrology Contact information: [place] [address] [phone]           Time spent:  Review of old chart, current chart, lab, x-ray, cardiac tests and discussion with patient and family over 70 minutes.  Signed: Birdie Riddle 10/30/2019, 4:06 PM

## 2019-10-31 ENCOUNTER — Other Ambulatory Visit (HOSPITAL_COMMUNITY): Payer: Self-pay | Admitting: *Deleted

## 2019-11-01 ENCOUNTER — Encounter (HOSPITAL_COMMUNITY): Payer: Self-pay

## 2019-11-01 ENCOUNTER — Encounter (HOSPITAL_COMMUNITY): Payer: Medicare HMO

## 2019-11-08 ENCOUNTER — Other Ambulatory Visit: Payer: Self-pay

## 2019-11-08 ENCOUNTER — Ambulatory Visit (HOSPITAL_COMMUNITY)
Admission: RE | Admit: 2019-11-08 | Discharge: 2019-11-08 | Disposition: A | Discharge status: Home / Self Care | Payer: Medicare HMO | Source: Ambulatory Visit | Attending: Nephrology | Admitting: Nephrology

## 2019-11-08 VITALS — BP 132/48 | HR 66 | Temp 97.2°F | Resp 16 | Ht 61.0 in | Wt 118.0 lb

## 2019-11-08 DIAGNOSIS — N184 Chronic kidney disease, stage 4 (severe): Secondary | ICD-10-CM

## 2019-11-08 DIAGNOSIS — D649 Anemia, unspecified: Secondary | ICD-10-CM | POA: Diagnosis not present

## 2019-11-08 DIAGNOSIS — I509 Heart failure, unspecified: Secondary | ICD-10-CM | POA: Diagnosis not present

## 2019-11-08 DIAGNOSIS — D631 Anemia in chronic kidney disease: Secondary | ICD-10-CM | POA: Diagnosis present

## 2019-11-08 LAB — POCT HEMOGLOBIN-HEMACUE: Hemoglobin: 8.9 g/dL — ABNORMAL LOW (ref 12.0–15.0)

## 2019-11-08 MED ORDER — EPOETIN ALFA-EPBX 10000 UNIT/ML IJ SOLN
20000.0000 [IU] | INTRAMUSCULAR | Status: DC
  Administered 2019-11-08: 20000 [IU] via SUBCUTANEOUS
  Filled 2019-11-08: qty 2

## 2019-11-08 MED ORDER — SODIUM CHLORIDE 0.9 % IV SOLN
510.0000 mg | INTRAVENOUS | Status: DC
  Administered 2019-11-08: 510 mg via INTRAVENOUS
  Filled 2019-11-08: qty 17

## 2019-11-15 ENCOUNTER — Other Ambulatory Visit: Payer: Self-pay

## 2019-11-15 ENCOUNTER — Encounter (HOSPITAL_COMMUNITY)
Admission: RE | Admit: 2019-11-15 | Discharge: 2019-11-15 | Disposition: A | Discharge status: Home / Self Care | Payer: Medicare HMO | Source: Ambulatory Visit | Attending: Nephrology | Admitting: Nephrology

## 2019-11-15 DIAGNOSIS — D631 Anemia in chronic kidney disease: Secondary | ICD-10-CM | POA: Insufficient documentation

## 2019-11-15 DIAGNOSIS — N184 Chronic kidney disease, stage 4 (severe): Secondary | ICD-10-CM | POA: Diagnosis not present

## 2019-11-15 MED ORDER — SODIUM CHLORIDE 0.9 % IV SOLN
510.0000 mg | INTRAVENOUS | Status: DC
  Administered 2019-11-15: 510 mg via INTRAVENOUS
  Filled 2019-11-15: qty 17

## 2019-11-16 DIAGNOSIS — N184 Chronic kidney disease, stage 4 (severe): Secondary | ICD-10-CM | POA: Diagnosis not present

## 2019-11-16 DIAGNOSIS — I10 Essential (primary) hypertension: Secondary | ICD-10-CM | POA: Diagnosis not present

## 2019-11-16 DIAGNOSIS — E440 Moderate protein-calorie malnutrition: Secondary | ICD-10-CM | POA: Diagnosis not present

## 2019-11-16 DIAGNOSIS — I5022 Chronic systolic (congestive) heart failure: Secondary | ICD-10-CM | POA: Diagnosis not present

## 2019-11-20 DIAGNOSIS — N184 Chronic kidney disease, stage 4 (severe): Secondary | ICD-10-CM | POA: Diagnosis not present

## 2019-11-20 DIAGNOSIS — E785 Hyperlipidemia, unspecified: Secondary | ICD-10-CM | POA: Diagnosis not present

## 2019-11-20 DIAGNOSIS — E039 Hypothyroidism, unspecified: Secondary | ICD-10-CM | POA: Diagnosis not present

## 2019-11-20 DIAGNOSIS — D519 Vitamin B12 deficiency anemia, unspecified: Secondary | ICD-10-CM | POA: Diagnosis not present

## 2019-11-20 DIAGNOSIS — N189 Chronic kidney disease, unspecified: Secondary | ICD-10-CM | POA: Diagnosis not present

## 2019-11-20 DIAGNOSIS — I129 Hypertensive chronic kidney disease with stage 1 through stage 4 chronic kidney disease, or unspecified chronic kidney disease: Secondary | ICD-10-CM | POA: Diagnosis not present

## 2019-11-20 DIAGNOSIS — N2581 Secondary hyperparathyroidism of renal origin: Secondary | ICD-10-CM | POA: Diagnosis not present

## 2019-11-22 ENCOUNTER — Encounter (HOSPITAL_COMMUNITY): Payer: Medicare HMO

## 2019-11-23 DIAGNOSIS — I5022 Chronic systolic (congestive) heart failure: Secondary | ICD-10-CM | POA: Diagnosis not present

## 2019-11-23 DIAGNOSIS — N184 Chronic kidney disease, stage 4 (severe): Secondary | ICD-10-CM | POA: Diagnosis not present

## 2019-11-23 DIAGNOSIS — E034 Atrophy of thyroid (acquired): Secondary | ICD-10-CM | POA: Diagnosis not present

## 2019-11-23 DIAGNOSIS — I10 Essential (primary) hypertension: Secondary | ICD-10-CM | POA: Diagnosis not present

## 2019-11-29 ENCOUNTER — Ambulatory Visit (HOSPITAL_COMMUNITY)
Admission: RE | Admit: 2019-11-29 | Discharge: 2019-11-29 | Disposition: A | Discharge status: Home / Self Care | Payer: Medicare HMO | Source: Ambulatory Visit | Attending: Nephrology | Admitting: Nephrology

## 2019-11-29 ENCOUNTER — Other Ambulatory Visit: Payer: Self-pay

## 2019-11-29 VITALS — BP 123/42 | HR 66 | Temp 95.4°F | Resp 18

## 2019-11-29 DIAGNOSIS — N184 Chronic kidney disease, stage 4 (severe): Secondary | ICD-10-CM | POA: Diagnosis not present

## 2019-11-29 DIAGNOSIS — D631 Anemia in chronic kidney disease: Secondary | ICD-10-CM | POA: Diagnosis present

## 2019-11-29 LAB — IRON AND TIBC
Iron: 110 ug/dL (ref 28–170)
Saturation Ratios: 45 % — ABNORMAL HIGH (ref 10.4–31.8)
TIBC: 244 ug/dL — ABNORMAL LOW (ref 250–450)
UIBC: 134 ug/dL

## 2019-11-29 LAB — POCT HEMOGLOBIN-HEMACUE: Hemoglobin: 10 g/dL — ABNORMAL LOW (ref 12.0–15.0)

## 2019-11-29 LAB — FERRITIN: Ferritin: 1168 ng/mL — ABNORMAL HIGH (ref 11–307)

## 2019-11-29 MED ORDER — EPOETIN ALFA-EPBX 10000 UNIT/ML IJ SOLN
20000.0000 [IU] | INTRAMUSCULAR | Status: DC
  Administered 2019-11-29: 20000 [IU] via SUBCUTANEOUS

## 2019-11-29 MED ORDER — EPOETIN ALFA-EPBX 10000 UNIT/ML IJ SOLN
INTRAMUSCULAR | Status: AC
  Filled 2019-11-29: qty 2

## 2019-12-01 DIAGNOSIS — R55 Syncope and collapse: Secondary | ICD-10-CM | POA: Diagnosis not present

## 2019-12-01 DIAGNOSIS — R531 Weakness: Secondary | ICD-10-CM | POA: Diagnosis not present

## 2019-12-01 DIAGNOSIS — I10 Essential (primary) hypertension: Secondary | ICD-10-CM | POA: Diagnosis not present

## 2019-12-01 DIAGNOSIS — R0902 Hypoxemia: Secondary | ICD-10-CM | POA: Diagnosis not present

## 2019-12-05 DIAGNOSIS — E875 Hyperkalemia: Secondary | ICD-10-CM | POA: Diagnosis not present

## 2019-12-13 ENCOUNTER — Other Ambulatory Visit: Payer: Self-pay | Admitting: Family Medicine

## 2019-12-13 ENCOUNTER — Ambulatory Visit
Admission: RE | Admit: 2019-12-13 | Discharge: 2019-12-13 | Disposition: A | Discharge status: Home / Self Care | Payer: Medicare HMO | Source: Ambulatory Visit | Attending: Nephrology | Admitting: Nephrology

## 2019-12-13 ENCOUNTER — Other Ambulatory Visit: Payer: Self-pay | Admitting: Nephrology

## 2019-12-13 ENCOUNTER — Ambulatory Visit (HOSPITAL_COMMUNITY)
Admission: RE | Admit: 2019-12-13 | Discharge: 2019-12-13 | Disposition: A | Discharge status: Home / Self Care | Payer: Medicare HMO | Source: Ambulatory Visit | Attending: Nephrology | Admitting: Nephrology

## 2019-12-13 ENCOUNTER — Other Ambulatory Visit: Payer: Self-pay

## 2019-12-13 VITALS — BP 125/45 | HR 61 | Temp 96.4°F | Resp 18

## 2019-12-13 DIAGNOSIS — N184 Chronic kidney disease, stage 4 (severe): Secondary | ICD-10-CM | POA: Diagnosis present

## 2019-12-13 DIAGNOSIS — D631 Anemia in chronic kidney disease: Secondary | ICD-10-CM | POA: Insufficient documentation

## 2019-12-13 DIAGNOSIS — R739 Hyperglycemia, unspecified: Secondary | ICD-10-CM | POA: Diagnosis not present

## 2019-12-13 DIAGNOSIS — Z1231 Encounter for screening mammogram for malignant neoplasm of breast: Secondary | ICD-10-CM

## 2019-12-13 LAB — POCT HEMOGLOBIN-HEMACUE: Hemoglobin: 10 g/dL — ABNORMAL LOW (ref 12.0–15.0)

## 2019-12-13 MED ORDER — EPOETIN ALFA-EPBX 10000 UNIT/ML IJ SOLN
20000.0000 [IU] | INTRAMUSCULAR | Status: DC
Start: 2019-12-13 — End: 2019-12-14
  Administered 2019-12-13: 20000 [IU] via SUBCUTANEOUS

## 2019-12-13 MED ORDER — EPOETIN ALFA-EPBX 10000 UNIT/ML IJ SOLN
INTRAMUSCULAR | Status: AC
  Filled 2019-12-13: qty 2

## 2019-12-19 DIAGNOSIS — N184 Chronic kidney disease, stage 4 (severe): Secondary | ICD-10-CM | POA: Diagnosis not present

## 2019-12-19 DIAGNOSIS — M1990 Unspecified osteoarthritis, unspecified site: Secondary | ICD-10-CM | POA: Diagnosis not present

## 2019-12-19 DIAGNOSIS — D631 Anemia in chronic kidney disease: Secondary | ICD-10-CM | POA: Diagnosis not present

## 2019-12-19 DIAGNOSIS — E7800 Pure hypercholesterolemia, unspecified: Secondary | ICD-10-CM | POA: Diagnosis not present

## 2019-12-19 DIAGNOSIS — R42 Dizziness and giddiness: Secondary | ICD-10-CM | POA: Diagnosis not present

## 2019-12-19 DIAGNOSIS — I5022 Chronic systolic (congestive) heart failure: Secondary | ICD-10-CM | POA: Diagnosis not present

## 2019-12-19 DIAGNOSIS — I130 Hypertensive heart and chronic kidney disease with heart failure and stage 1 through stage 4 chronic kidney disease, or unspecified chronic kidney disease: Secondary | ICD-10-CM | POA: Diagnosis not present

## 2019-12-19 DIAGNOSIS — R531 Weakness: Secondary | ICD-10-CM | POA: Diagnosis not present

## 2019-12-19 DIAGNOSIS — M810 Age-related osteoporosis without current pathological fracture: Secondary | ICD-10-CM | POA: Diagnosis not present

## 2019-12-19 DIAGNOSIS — K5790 Diverticulosis of intestine, part unspecified, without perforation or abscess without bleeding: Secondary | ICD-10-CM | POA: Diagnosis not present

## 2019-12-21 DIAGNOSIS — N184 Chronic kidney disease, stage 4 (severe): Secondary | ICD-10-CM | POA: Diagnosis not present

## 2019-12-21 DIAGNOSIS — M810 Age-related osteoporosis without current pathological fracture: Secondary | ICD-10-CM | POA: Diagnosis not present

## 2019-12-21 DIAGNOSIS — R531 Weakness: Secondary | ICD-10-CM | POA: Diagnosis not present

## 2019-12-21 DIAGNOSIS — I130 Hypertensive heart and chronic kidney disease with heart failure and stage 1 through stage 4 chronic kidney disease, or unspecified chronic kidney disease: Secondary | ICD-10-CM | POA: Diagnosis not present

## 2019-12-21 DIAGNOSIS — D631 Anemia in chronic kidney disease: Secondary | ICD-10-CM | POA: Diagnosis not present

## 2019-12-21 DIAGNOSIS — I5022 Chronic systolic (congestive) heart failure: Secondary | ICD-10-CM | POA: Diagnosis not present

## 2019-12-21 DIAGNOSIS — K5790 Diverticulosis of intestine, part unspecified, without perforation or abscess without bleeding: Secondary | ICD-10-CM | POA: Diagnosis not present

## 2019-12-21 DIAGNOSIS — R42 Dizziness and giddiness: Secondary | ICD-10-CM | POA: Diagnosis not present

## 2019-12-21 DIAGNOSIS — M1990 Unspecified osteoarthritis, unspecified site: Secondary | ICD-10-CM | POA: Diagnosis not present

## 2019-12-21 DIAGNOSIS — E7800 Pure hypercholesterolemia, unspecified: Secondary | ICD-10-CM | POA: Diagnosis not present

## 2019-12-22 DIAGNOSIS — R262 Difficulty in walking, not elsewhere classified: Secondary | ICD-10-CM | POA: Diagnosis not present

## 2019-12-22 DIAGNOSIS — M6281 Muscle weakness (generalized): Secondary | ICD-10-CM | POA: Diagnosis not present

## 2019-12-25 DIAGNOSIS — M810 Age-related osteoporosis without current pathological fracture: Secondary | ICD-10-CM | POA: Diagnosis not present

## 2019-12-25 DIAGNOSIS — E7800 Pure hypercholesterolemia, unspecified: Secondary | ICD-10-CM | POA: Diagnosis not present

## 2019-12-25 DIAGNOSIS — M1990 Unspecified osteoarthritis, unspecified site: Secondary | ICD-10-CM | POA: Diagnosis not present

## 2019-12-25 DIAGNOSIS — R42 Dizziness and giddiness: Secondary | ICD-10-CM | POA: Diagnosis not present

## 2019-12-25 DIAGNOSIS — I5022 Chronic systolic (congestive) heart failure: Secondary | ICD-10-CM | POA: Diagnosis not present

## 2019-12-25 DIAGNOSIS — R531 Weakness: Secondary | ICD-10-CM | POA: Diagnosis not present

## 2019-12-25 DIAGNOSIS — D631 Anemia in chronic kidney disease: Secondary | ICD-10-CM | POA: Diagnosis not present

## 2019-12-25 DIAGNOSIS — I130 Hypertensive heart and chronic kidney disease with heart failure and stage 1 through stage 4 chronic kidney disease, or unspecified chronic kidney disease: Secondary | ICD-10-CM | POA: Diagnosis not present

## 2019-12-25 DIAGNOSIS — N184 Chronic kidney disease, stage 4 (severe): Secondary | ICD-10-CM | POA: Diagnosis not present

## 2019-12-25 DIAGNOSIS — K5790 Diverticulosis of intestine, part unspecified, without perforation or abscess without bleeding: Secondary | ICD-10-CM | POA: Diagnosis not present

## 2019-12-27 ENCOUNTER — Ambulatory Visit (HOSPITAL_COMMUNITY)
Admission: RE | Admit: 2019-12-27 | Discharge: 2019-12-27 | Disposition: A | Discharge status: Home / Self Care | Payer: Medicare HMO | Source: Ambulatory Visit | Attending: Nephrology | Admitting: Nephrology

## 2019-12-27 VITALS — BP 164/47 | HR 55 | Temp 95.6°F | Resp 20

## 2019-12-27 DIAGNOSIS — K5790 Diverticulosis of intestine, part unspecified, without perforation or abscess without bleeding: Secondary | ICD-10-CM | POA: Diagnosis not present

## 2019-12-27 DIAGNOSIS — I130 Hypertensive heart and chronic kidney disease with heart failure and stage 1 through stage 4 chronic kidney disease, or unspecified chronic kidney disease: Secondary | ICD-10-CM | POA: Diagnosis not present

## 2019-12-27 DIAGNOSIS — M1990 Unspecified osteoarthritis, unspecified site: Secondary | ICD-10-CM | POA: Diagnosis not present

## 2019-12-27 DIAGNOSIS — D631 Anemia in chronic kidney disease: Secondary | ICD-10-CM | POA: Insufficient documentation

## 2019-12-27 DIAGNOSIS — N184 Chronic kidney disease, stage 4 (severe): Secondary | ICD-10-CM | POA: Diagnosis not present

## 2019-12-27 DIAGNOSIS — R531 Weakness: Secondary | ICD-10-CM | POA: Diagnosis not present

## 2019-12-27 DIAGNOSIS — M810 Age-related osteoporosis without current pathological fracture: Secondary | ICD-10-CM | POA: Diagnosis not present

## 2019-12-27 DIAGNOSIS — I10 Essential (primary) hypertension: Secondary | ICD-10-CM | POA: Diagnosis not present

## 2019-12-27 DIAGNOSIS — E034 Atrophy of thyroid (acquired): Secondary | ICD-10-CM | POA: Diagnosis not present

## 2019-12-27 DIAGNOSIS — I5022 Chronic systolic (congestive) heart failure: Secondary | ICD-10-CM | POA: Diagnosis not present

## 2019-12-27 DIAGNOSIS — E7800 Pure hypercholesterolemia, unspecified: Secondary | ICD-10-CM | POA: Diagnosis not present

## 2019-12-27 DIAGNOSIS — R42 Dizziness and giddiness: Secondary | ICD-10-CM | POA: Diagnosis not present

## 2019-12-27 LAB — FERRITIN: Ferritin: 560 ng/mL — ABNORMAL HIGH (ref 11–307)

## 2019-12-27 LAB — POCT HEMOGLOBIN-HEMACUE: Hemoglobin: 10.9 g/dL — ABNORMAL LOW (ref 12.0–15.0)

## 2019-12-27 LAB — IRON AND TIBC
Iron: 112 ug/dL (ref 28–170)
Saturation Ratios: 41 % — ABNORMAL HIGH (ref 10.4–31.8)
TIBC: 272 ug/dL (ref 250–450)
UIBC: 160 ug/dL

## 2019-12-27 MED ORDER — EPOETIN ALFA-EPBX 10000 UNIT/ML IJ SOLN: 20000.0000 [IU] | INTRAMUSCULAR | Status: DC

## 2019-12-27 MED ORDER — EPOETIN ALFA-EPBX 10000 UNIT/ML IJ SOLN
INTRAMUSCULAR | Status: AC
  Administered 2019-12-27: 20000 [IU] via SUBCUTANEOUS
  Filled 2019-12-27: qty 1

## 2019-12-29 DIAGNOSIS — D631 Anemia in chronic kidney disease: Secondary | ICD-10-CM | POA: Diagnosis not present

## 2019-12-29 DIAGNOSIS — E7800 Pure hypercholesterolemia, unspecified: Secondary | ICD-10-CM | POA: Diagnosis not present

## 2019-12-29 DIAGNOSIS — K5790 Diverticulosis of intestine, part unspecified, without perforation or abscess without bleeding: Secondary | ICD-10-CM | POA: Diagnosis not present

## 2019-12-29 DIAGNOSIS — I5022 Chronic systolic (congestive) heart failure: Secondary | ICD-10-CM | POA: Diagnosis not present

## 2019-12-29 DIAGNOSIS — I130 Hypertensive heart and chronic kidney disease with heart failure and stage 1 through stage 4 chronic kidney disease, or unspecified chronic kidney disease: Secondary | ICD-10-CM | POA: Diagnosis not present

## 2019-12-29 DIAGNOSIS — R531 Weakness: Secondary | ICD-10-CM | POA: Diagnosis not present

## 2019-12-29 DIAGNOSIS — M1990 Unspecified osteoarthritis, unspecified site: Secondary | ICD-10-CM | POA: Diagnosis not present

## 2019-12-29 DIAGNOSIS — R42 Dizziness and giddiness: Secondary | ICD-10-CM | POA: Diagnosis not present

## 2019-12-29 DIAGNOSIS — N184 Chronic kidney disease, stage 4 (severe): Secondary | ICD-10-CM | POA: Diagnosis not present

## 2019-12-29 DIAGNOSIS — M810 Age-related osteoporosis without current pathological fracture: Secondary | ICD-10-CM | POA: Diagnosis not present

## 2019-12-30 IMAGING — CR DG CHEST 2V
2 series · 2 of 2 positions shown · non-contrast
Comparison: Chest x-ray 03/24/2007

CLINICAL DATA: Short of breath, cough for 2 days

EXAM:
CHEST - 2 VIEW

[chest lat]
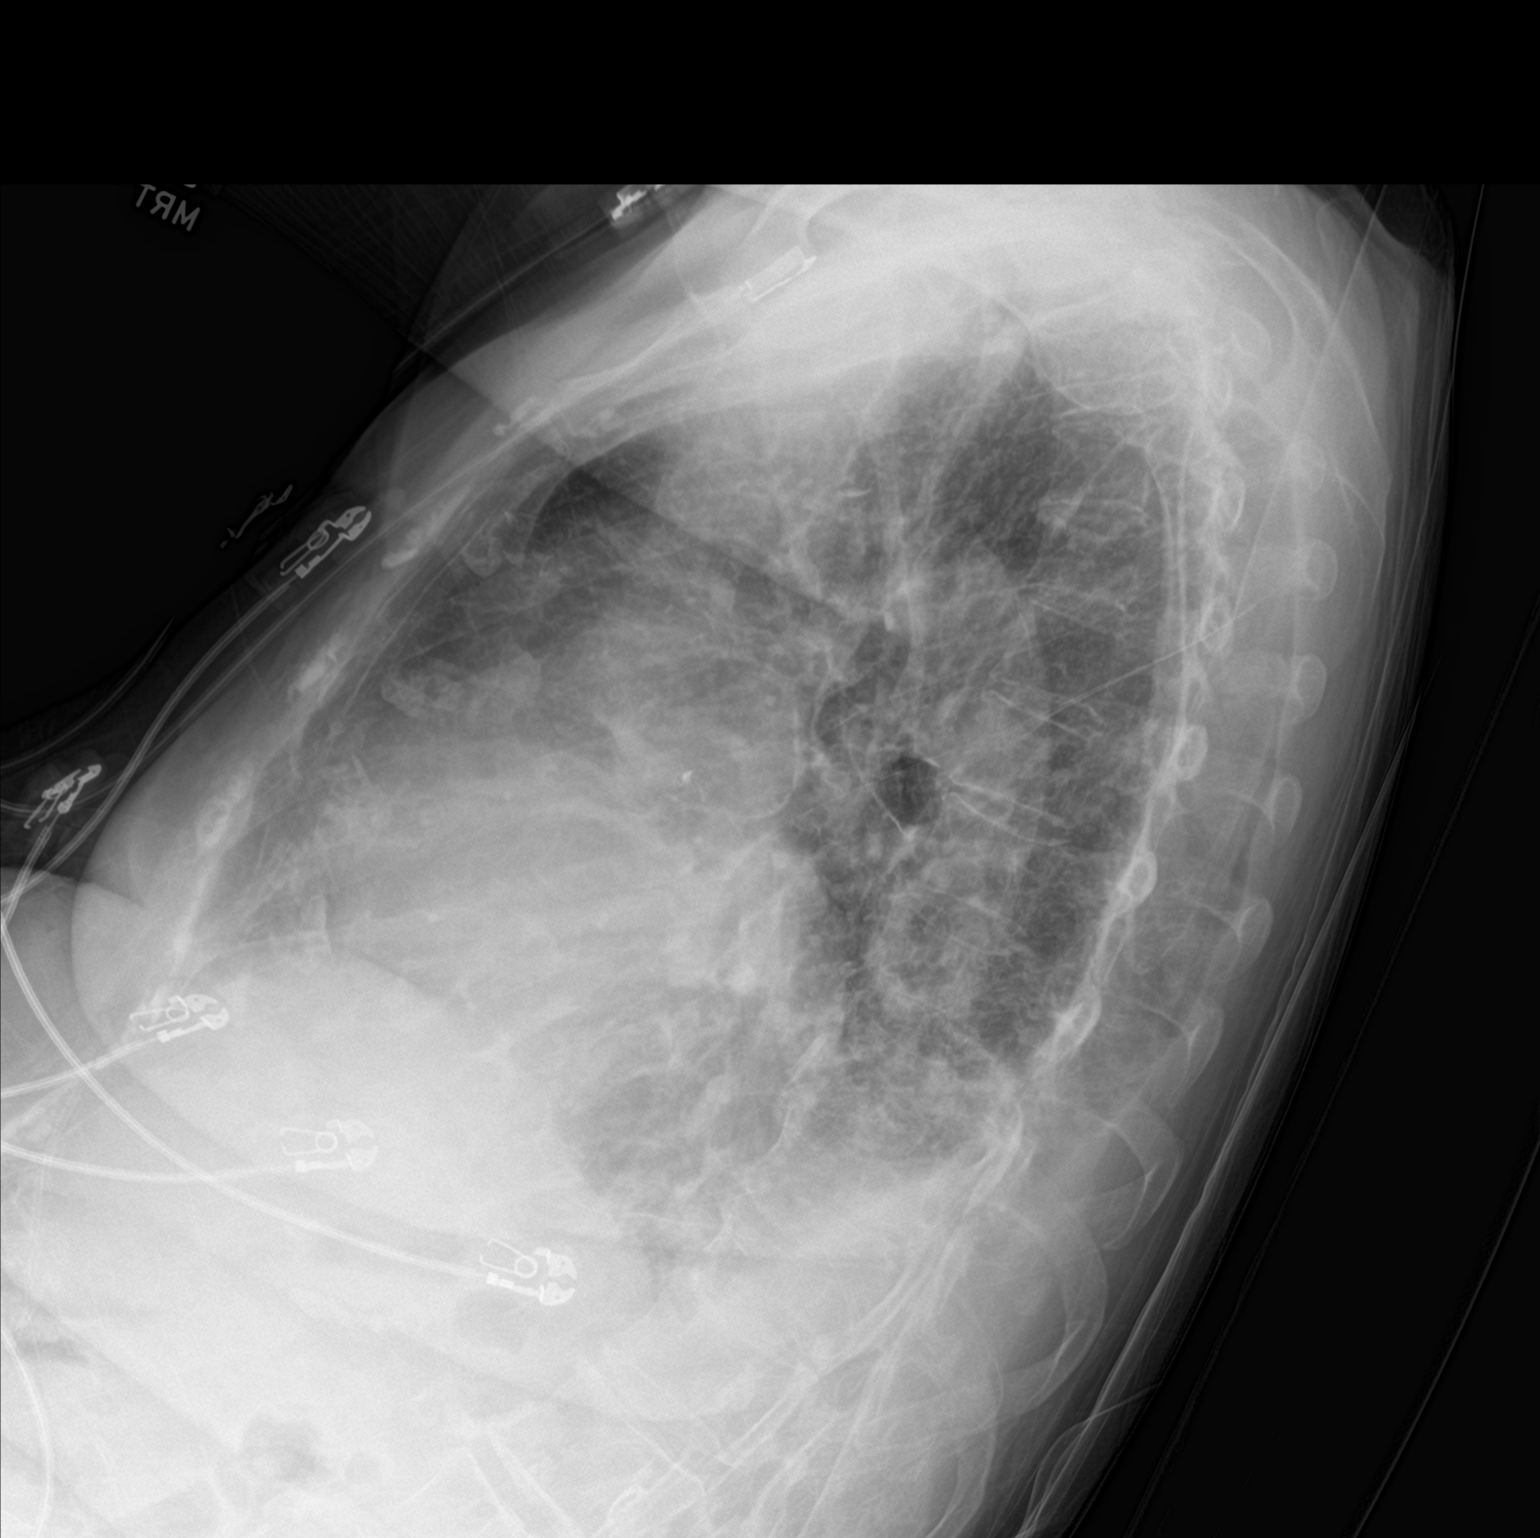

[chest ap]
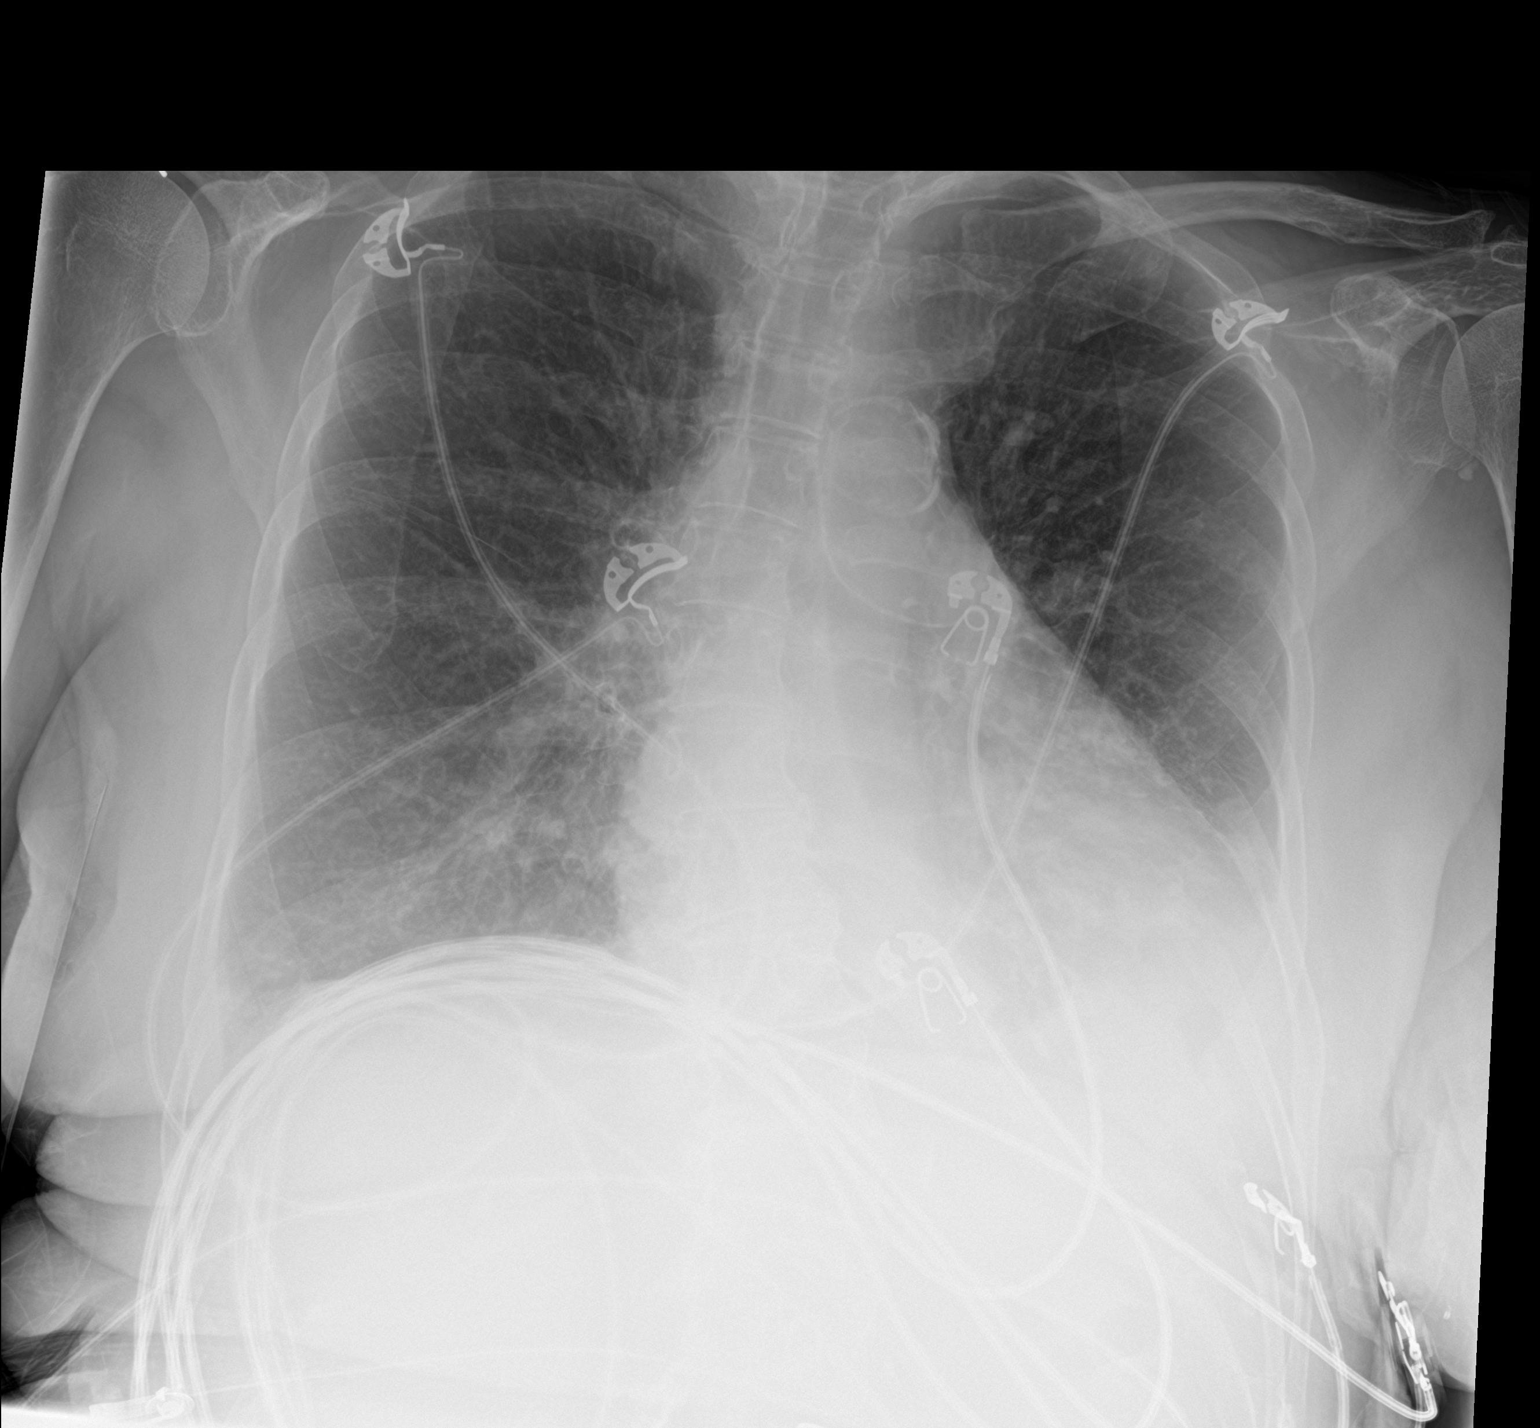

[2 of 2 positions shown; findings below may reference images not displayed]

FINDINGS: There is opacity at the left lung base posteriorly most consistent
with patchy left lower lobe pneumonia and small left pleural
effusion. A small right pleural effusion and be excluded. There is
cardiomegaly present and there may be superimposed mild pulmonary
vascular congestion. Curvature of the thoracic spine is unchanged.
IMPRESSION: 1. Left lower lobe opacity most consistent with pneumonia and small
left pleural effusion.
2. Cardiomegaly. Can not exclude very mild pulmonary vascular
congestion.

## 2020-01-01 DIAGNOSIS — M1990 Unspecified osteoarthritis, unspecified site: Secondary | ICD-10-CM | POA: Diagnosis not present

## 2020-01-01 DIAGNOSIS — E7800 Pure hypercholesterolemia, unspecified: Secondary | ICD-10-CM | POA: Diagnosis not present

## 2020-01-01 DIAGNOSIS — N184 Chronic kidney disease, stage 4 (severe): Secondary | ICD-10-CM | POA: Diagnosis not present

## 2020-01-01 DIAGNOSIS — I5022 Chronic systolic (congestive) heart failure: Secondary | ICD-10-CM | POA: Diagnosis not present

## 2020-01-01 DIAGNOSIS — R42 Dizziness and giddiness: Secondary | ICD-10-CM | POA: Diagnosis not present

## 2020-01-01 DIAGNOSIS — I130 Hypertensive heart and chronic kidney disease with heart failure and stage 1 through stage 4 chronic kidney disease, or unspecified chronic kidney disease: Secondary | ICD-10-CM | POA: Diagnosis not present

## 2020-01-01 DIAGNOSIS — K5790 Diverticulosis of intestine, part unspecified, without perforation or abscess without bleeding: Secondary | ICD-10-CM | POA: Diagnosis not present

## 2020-01-01 DIAGNOSIS — D631 Anemia in chronic kidney disease: Secondary | ICD-10-CM | POA: Diagnosis not present

## 2020-01-01 DIAGNOSIS — R531 Weakness: Secondary | ICD-10-CM | POA: Diagnosis not present

## 2020-01-01 DIAGNOSIS — M810 Age-related osteoporosis without current pathological fracture: Secondary | ICD-10-CM | POA: Diagnosis not present

## 2020-01-03 ENCOUNTER — Ambulatory Visit: Payer: Medicare HMO

## 2020-01-03 DIAGNOSIS — K5790 Diverticulosis of intestine, part unspecified, without perforation or abscess without bleeding: Secondary | ICD-10-CM | POA: Diagnosis not present

## 2020-01-03 DIAGNOSIS — D631 Anemia in chronic kidney disease: Secondary | ICD-10-CM | POA: Diagnosis not present

## 2020-01-03 DIAGNOSIS — N184 Chronic kidney disease, stage 4 (severe): Secondary | ICD-10-CM | POA: Diagnosis not present

## 2020-01-03 DIAGNOSIS — R42 Dizziness and giddiness: Secondary | ICD-10-CM | POA: Diagnosis not present

## 2020-01-03 DIAGNOSIS — R531 Weakness: Secondary | ICD-10-CM | POA: Diagnosis not present

## 2020-01-03 DIAGNOSIS — E7800 Pure hypercholesterolemia, unspecified: Secondary | ICD-10-CM | POA: Diagnosis not present

## 2020-01-03 DIAGNOSIS — M1990 Unspecified osteoarthritis, unspecified site: Secondary | ICD-10-CM | POA: Diagnosis not present

## 2020-01-03 DIAGNOSIS — M810 Age-related osteoporosis without current pathological fracture: Secondary | ICD-10-CM | POA: Diagnosis not present

## 2020-01-03 DIAGNOSIS — I5022 Chronic systolic (congestive) heart failure: Secondary | ICD-10-CM | POA: Diagnosis not present

## 2020-01-03 DIAGNOSIS — I130 Hypertensive heart and chronic kidney disease with heart failure and stage 1 through stage 4 chronic kidney disease, or unspecified chronic kidney disease: Secondary | ICD-10-CM | POA: Diagnosis not present

## 2020-01-04 DIAGNOSIS — M1990 Unspecified osteoarthritis, unspecified site: Secondary | ICD-10-CM | POA: Diagnosis not present

## 2020-01-04 DIAGNOSIS — I130 Hypertensive heart and chronic kidney disease with heart failure and stage 1 through stage 4 chronic kidney disease, or unspecified chronic kidney disease: Secondary | ICD-10-CM | POA: Diagnosis not present

## 2020-01-04 DIAGNOSIS — D631 Anemia in chronic kidney disease: Secondary | ICD-10-CM | POA: Diagnosis not present

## 2020-01-04 DIAGNOSIS — I5022 Chronic systolic (congestive) heart failure: Secondary | ICD-10-CM | POA: Diagnosis not present

## 2020-01-04 DIAGNOSIS — N184 Chronic kidney disease, stage 4 (severe): Secondary | ICD-10-CM | POA: Diagnosis not present

## 2020-01-04 DIAGNOSIS — M810 Age-related osteoporosis without current pathological fracture: Secondary | ICD-10-CM | POA: Diagnosis not present

## 2020-01-04 DIAGNOSIS — R42 Dizziness and giddiness: Secondary | ICD-10-CM | POA: Diagnosis not present

## 2020-01-04 DIAGNOSIS — R531 Weakness: Secondary | ICD-10-CM | POA: Diagnosis not present

## 2020-01-04 DIAGNOSIS — E7800 Pure hypercholesterolemia, unspecified: Secondary | ICD-10-CM | POA: Diagnosis not present

## 2020-01-04 DIAGNOSIS — K5790 Diverticulosis of intestine, part unspecified, without perforation or abscess without bleeding: Secondary | ICD-10-CM | POA: Diagnosis not present

## 2020-01-05 DIAGNOSIS — I130 Hypertensive heart and chronic kidney disease with heart failure and stage 1 through stage 4 chronic kidney disease, or unspecified chronic kidney disease: Secondary | ICD-10-CM | POA: Diagnosis not present

## 2020-01-05 DIAGNOSIS — I10 Essential (primary) hypertension: Secondary | ICD-10-CM | POA: Diagnosis not present

## 2020-01-05 DIAGNOSIS — I5022 Chronic systolic (congestive) heart failure: Secondary | ICD-10-CM | POA: Diagnosis not present

## 2020-01-05 DIAGNOSIS — I509 Heart failure, unspecified: Secondary | ICD-10-CM | POA: Diagnosis not present

## 2020-01-05 DIAGNOSIS — I129 Hypertensive chronic kidney disease with stage 1 through stage 4 chronic kidney disease, or unspecified chronic kidney disease: Secondary | ICD-10-CM | POA: Diagnosis not present

## 2020-01-05 DIAGNOSIS — N184 Chronic kidney disease, stage 4 (severe): Secondary | ICD-10-CM | POA: Diagnosis not present

## 2020-01-05 DIAGNOSIS — D631 Anemia in chronic kidney disease: Secondary | ICD-10-CM | POA: Diagnosis not present

## 2020-01-05 DIAGNOSIS — K5790 Diverticulosis of intestine, part unspecified, without perforation or abscess without bleeding: Secondary | ICD-10-CM | POA: Diagnosis not present

## 2020-01-05 DIAGNOSIS — R42 Dizziness and giddiness: Secondary | ICD-10-CM | POA: Diagnosis not present

## 2020-01-05 DIAGNOSIS — M1990 Unspecified osteoarthritis, unspecified site: Secondary | ICD-10-CM | POA: Diagnosis not present

## 2020-01-05 DIAGNOSIS — M810 Age-related osteoporosis without current pathological fracture: Secondary | ICD-10-CM | POA: Diagnosis not present

## 2020-01-05 DIAGNOSIS — E7800 Pure hypercholesterolemia, unspecified: Secondary | ICD-10-CM | POA: Diagnosis not present

## 2020-01-05 DIAGNOSIS — R531 Weakness: Secondary | ICD-10-CM | POA: Diagnosis not present

## 2020-01-05 DIAGNOSIS — E039 Hypothyroidism, unspecified: Secondary | ICD-10-CM | POA: Diagnosis not present

## 2020-01-08 DIAGNOSIS — N184 Chronic kidney disease, stage 4 (severe): Secondary | ICD-10-CM | POA: Diagnosis not present

## 2020-01-08 DIAGNOSIS — E785 Hyperlipidemia, unspecified: Secondary | ICD-10-CM | POA: Diagnosis not present

## 2020-01-08 DIAGNOSIS — N189 Chronic kidney disease, unspecified: Secondary | ICD-10-CM | POA: Diagnosis not present

## 2020-01-08 DIAGNOSIS — I129 Hypertensive chronic kidney disease with stage 1 through stage 4 chronic kidney disease, or unspecified chronic kidney disease: Secondary | ICD-10-CM | POA: Diagnosis not present

## 2020-01-08 DIAGNOSIS — N2581 Secondary hyperparathyroidism of renal origin: Secondary | ICD-10-CM | POA: Diagnosis not present

## 2020-01-08 DIAGNOSIS — D519 Vitamin B12 deficiency anemia, unspecified: Secondary | ICD-10-CM | POA: Diagnosis not present

## 2020-01-08 DIAGNOSIS — E039 Hypothyroidism, unspecified: Secondary | ICD-10-CM | POA: Diagnosis not present

## 2020-01-09 DIAGNOSIS — M1990 Unspecified osteoarthritis, unspecified site: Secondary | ICD-10-CM | POA: Diagnosis not present

## 2020-01-09 DIAGNOSIS — N184 Chronic kidney disease, stage 4 (severe): Secondary | ICD-10-CM | POA: Diagnosis not present

## 2020-01-09 DIAGNOSIS — R42 Dizziness and giddiness: Secondary | ICD-10-CM | POA: Diagnosis not present

## 2020-01-09 DIAGNOSIS — E7800 Pure hypercholesterolemia, unspecified: Secondary | ICD-10-CM | POA: Diagnosis not present

## 2020-01-09 DIAGNOSIS — K5790 Diverticulosis of intestine, part unspecified, without perforation or abscess without bleeding: Secondary | ICD-10-CM | POA: Diagnosis not present

## 2020-01-09 DIAGNOSIS — M810 Age-related osteoporosis without current pathological fracture: Secondary | ICD-10-CM | POA: Diagnosis not present

## 2020-01-09 DIAGNOSIS — R531 Weakness: Secondary | ICD-10-CM | POA: Diagnosis not present

## 2020-01-09 DIAGNOSIS — I5022 Chronic systolic (congestive) heart failure: Secondary | ICD-10-CM | POA: Diagnosis not present

## 2020-01-09 DIAGNOSIS — D631 Anemia in chronic kidney disease: Secondary | ICD-10-CM | POA: Diagnosis not present

## 2020-01-09 DIAGNOSIS — I130 Hypertensive heart and chronic kidney disease with heart failure and stage 1 through stage 4 chronic kidney disease, or unspecified chronic kidney disease: Secondary | ICD-10-CM | POA: Diagnosis not present

## 2020-01-10 ENCOUNTER — Ambulatory Visit (HOSPITAL_COMMUNITY)
Admission: RE | Admit: 2020-01-10 | Discharge: 2020-01-10 | Disposition: A | Discharge status: Home / Self Care | Payer: Medicare HMO | Source: Ambulatory Visit | Attending: Nephrology | Admitting: Nephrology

## 2020-01-10 ENCOUNTER — Other Ambulatory Visit: Payer: Self-pay

## 2020-01-10 VITALS — BP 169/56 | HR 57 | Temp 96.7°F | Resp 20

## 2020-01-10 DIAGNOSIS — D631 Anemia in chronic kidney disease: Secondary | ICD-10-CM | POA: Insufficient documentation

## 2020-01-10 DIAGNOSIS — M810 Age-related osteoporosis without current pathological fracture: Secondary | ICD-10-CM | POA: Diagnosis not present

## 2020-01-10 DIAGNOSIS — R531 Weakness: Secondary | ICD-10-CM | POA: Diagnosis not present

## 2020-01-10 DIAGNOSIS — K5790 Diverticulosis of intestine, part unspecified, without perforation or abscess without bleeding: Secondary | ICD-10-CM | POA: Diagnosis not present

## 2020-01-10 DIAGNOSIS — E7800 Pure hypercholesterolemia, unspecified: Secondary | ICD-10-CM | POA: Diagnosis not present

## 2020-01-10 DIAGNOSIS — I130 Hypertensive heart and chronic kidney disease with heart failure and stage 1 through stage 4 chronic kidney disease, or unspecified chronic kidney disease: Secondary | ICD-10-CM | POA: Diagnosis not present

## 2020-01-10 DIAGNOSIS — I5022 Chronic systolic (congestive) heart failure: Secondary | ICD-10-CM | POA: Diagnosis not present

## 2020-01-10 DIAGNOSIS — M1990 Unspecified osteoarthritis, unspecified site: Secondary | ICD-10-CM | POA: Diagnosis not present

## 2020-01-10 DIAGNOSIS — N184 Chronic kidney disease, stage 4 (severe): Secondary | ICD-10-CM | POA: Diagnosis not present

## 2020-01-10 DIAGNOSIS — R42 Dizziness and giddiness: Secondary | ICD-10-CM | POA: Diagnosis not present

## 2020-01-10 LAB — POCT HEMOGLOBIN-HEMACUE: Hemoglobin: 10.8 g/dL — ABNORMAL LOW (ref 12.0–15.0)

## 2020-01-10 MED ORDER — EPOETIN ALFA-EPBX 2000 UNIT/ML IJ SOLN
INTRAMUSCULAR | Status: AC
  Filled 2020-01-10: qty 1

## 2020-01-10 MED ORDER — EPOETIN ALFA-EPBX 10000 UNIT/ML IJ SOLN
20000.0000 [IU] | INTRAMUSCULAR | Status: DC
  Administered 2020-01-10: 20000 [IU] via SUBCUTANEOUS

## 2020-01-11 DIAGNOSIS — I5022 Chronic systolic (congestive) heart failure: Secondary | ICD-10-CM | POA: Diagnosis not present

## 2020-01-11 DIAGNOSIS — D631 Anemia in chronic kidney disease: Secondary | ICD-10-CM | POA: Diagnosis not present

## 2020-01-11 DIAGNOSIS — N184 Chronic kidney disease, stage 4 (severe): Secondary | ICD-10-CM | POA: Diagnosis not present

## 2020-01-11 DIAGNOSIS — E7800 Pure hypercholesterolemia, unspecified: Secondary | ICD-10-CM | POA: Diagnosis not present

## 2020-01-11 DIAGNOSIS — R42 Dizziness and giddiness: Secondary | ICD-10-CM | POA: Diagnosis not present

## 2020-01-11 DIAGNOSIS — M810 Age-related osteoporosis without current pathological fracture: Secondary | ICD-10-CM | POA: Diagnosis not present

## 2020-01-11 DIAGNOSIS — R531 Weakness: Secondary | ICD-10-CM | POA: Diagnosis not present

## 2020-01-11 DIAGNOSIS — K5790 Diverticulosis of intestine, part unspecified, without perforation or abscess without bleeding: Secondary | ICD-10-CM | POA: Diagnosis not present

## 2020-01-11 DIAGNOSIS — I130 Hypertensive heart and chronic kidney disease with heart failure and stage 1 through stage 4 chronic kidney disease, or unspecified chronic kidney disease: Secondary | ICD-10-CM | POA: Diagnosis not present

## 2020-01-11 DIAGNOSIS — M1990 Unspecified osteoarthritis, unspecified site: Secondary | ICD-10-CM | POA: Diagnosis not present

## 2020-01-12 ENCOUNTER — Ambulatory Visit: Payer: Medicare HMO | Attending: Internal Medicine

## 2020-01-12 DIAGNOSIS — Z23 Encounter for immunization: Secondary | ICD-10-CM | POA: Insufficient documentation

## 2020-01-12 DIAGNOSIS — I5022 Chronic systolic (congestive) heart failure: Secondary | ICD-10-CM | POA: Diagnosis not present

## 2020-01-12 DIAGNOSIS — N184 Chronic kidney disease, stage 4 (severe): Secondary | ICD-10-CM | POA: Diagnosis not present

## 2020-01-12 DIAGNOSIS — K5790 Diverticulosis of intestine, part unspecified, without perforation or abscess without bleeding: Secondary | ICD-10-CM | POA: Diagnosis not present

## 2020-01-12 DIAGNOSIS — M810 Age-related osteoporosis without current pathological fracture: Secondary | ICD-10-CM | POA: Diagnosis not present

## 2020-01-12 DIAGNOSIS — D631 Anemia in chronic kidney disease: Secondary | ICD-10-CM | POA: Diagnosis not present

## 2020-01-12 DIAGNOSIS — R42 Dizziness and giddiness: Secondary | ICD-10-CM | POA: Diagnosis not present

## 2020-01-12 DIAGNOSIS — R531 Weakness: Secondary | ICD-10-CM | POA: Diagnosis not present

## 2020-01-12 DIAGNOSIS — I130 Hypertensive heart and chronic kidney disease with heart failure and stage 1 through stage 4 chronic kidney disease, or unspecified chronic kidney disease: Secondary | ICD-10-CM | POA: Diagnosis not present

## 2020-01-12 DIAGNOSIS — M1990 Unspecified osteoarthritis, unspecified site: Secondary | ICD-10-CM | POA: Diagnosis not present

## 2020-01-12 DIAGNOSIS — E7800 Pure hypercholesterolemia, unspecified: Secondary | ICD-10-CM | POA: Diagnosis not present

## 2020-01-12 NOTE — Progress Notes (Signed)
   Covid-19 Vaccination Clinic  Name:  Alicia Sellers    MRN: [id number] DOB: 1929-03-15  01/12/2020  Alicia Sellers was observed post Covid-19 immunization for 15 minutes without incidence. She was provided with Vaccine Information Sheet and instruction to access the V-Safe system.   Alicia Sellers was instructed to call 911 with any severe reactions post vaccine: Marland Kitchen Difficulty breathing  . Swelling of your face and throat  . A fast heartbeat  . A bad rash all over your body  . Dizziness and weakness    Immunizations Administered    Name Date Dose VIS Date Route   Pfizer COVID-19 Vaccine 01/12/2020  9:47 AM 0.3 mL 11/17/2019 Intramuscular   Manufacturer: [place]   Lot: [id number]   [place]: [address]-3

## 2020-01-16 DIAGNOSIS — R531 Weakness: Secondary | ICD-10-CM | POA: Diagnosis not present

## 2020-01-16 DIAGNOSIS — M1990 Unspecified osteoarthritis, unspecified site: Secondary | ICD-10-CM | POA: Diagnosis not present

## 2020-01-16 DIAGNOSIS — D631 Anemia in chronic kidney disease: Secondary | ICD-10-CM | POA: Diagnosis not present

## 2020-01-16 DIAGNOSIS — M810 Age-related osteoporosis without current pathological fracture: Secondary | ICD-10-CM | POA: Diagnosis not present

## 2020-01-16 DIAGNOSIS — N184 Chronic kidney disease, stage 4 (severe): Secondary | ICD-10-CM | POA: Diagnosis not present

## 2020-01-16 DIAGNOSIS — I5022 Chronic systolic (congestive) heart failure: Secondary | ICD-10-CM | POA: Diagnosis not present

## 2020-01-16 DIAGNOSIS — I130 Hypertensive heart and chronic kidney disease with heart failure and stage 1 through stage 4 chronic kidney disease, or unspecified chronic kidney disease: Secondary | ICD-10-CM | POA: Diagnosis not present

## 2020-01-16 DIAGNOSIS — E7800 Pure hypercholesterolemia, unspecified: Secondary | ICD-10-CM | POA: Diagnosis not present

## 2020-01-16 DIAGNOSIS — R42 Dizziness and giddiness: Secondary | ICD-10-CM | POA: Diagnosis not present

## 2020-01-16 DIAGNOSIS — K5790 Diverticulosis of intestine, part unspecified, without perforation or abscess without bleeding: Secondary | ICD-10-CM | POA: Diagnosis not present

## 2020-01-18 DIAGNOSIS — K5790 Diverticulosis of intestine, part unspecified, without perforation or abscess without bleeding: Secondary | ICD-10-CM | POA: Diagnosis not present

## 2020-01-18 DIAGNOSIS — E7800 Pure hypercholesterolemia, unspecified: Secondary | ICD-10-CM | POA: Diagnosis not present

## 2020-01-18 DIAGNOSIS — M810 Age-related osteoporosis without current pathological fracture: Secondary | ICD-10-CM | POA: Diagnosis not present

## 2020-01-18 DIAGNOSIS — R42 Dizziness and giddiness: Secondary | ICD-10-CM | POA: Diagnosis not present

## 2020-01-18 DIAGNOSIS — N184 Chronic kidney disease, stage 4 (severe): Secondary | ICD-10-CM | POA: Diagnosis not present

## 2020-01-18 DIAGNOSIS — R531 Weakness: Secondary | ICD-10-CM | POA: Diagnosis not present

## 2020-01-18 DIAGNOSIS — D631 Anemia in chronic kidney disease: Secondary | ICD-10-CM | POA: Diagnosis not present

## 2020-01-18 DIAGNOSIS — M1990 Unspecified osteoarthritis, unspecified site: Secondary | ICD-10-CM | POA: Diagnosis not present

## 2020-01-18 DIAGNOSIS — I5022 Chronic systolic (congestive) heart failure: Secondary | ICD-10-CM | POA: Diagnosis not present

## 2020-01-18 DIAGNOSIS — I130 Hypertensive heart and chronic kidney disease with heart failure and stage 1 through stage 4 chronic kidney disease, or unspecified chronic kidney disease: Secondary | ICD-10-CM | POA: Diagnosis not present

## 2020-01-19 DIAGNOSIS — E7800 Pure hypercholesterolemia, unspecified: Secondary | ICD-10-CM | POA: Diagnosis not present

## 2020-01-19 DIAGNOSIS — R42 Dizziness and giddiness: Secondary | ICD-10-CM | POA: Diagnosis not present

## 2020-01-19 DIAGNOSIS — K5790 Diverticulosis of intestine, part unspecified, without perforation or abscess without bleeding: Secondary | ICD-10-CM | POA: Diagnosis not present

## 2020-01-19 DIAGNOSIS — M810 Age-related osteoporosis without current pathological fracture: Secondary | ICD-10-CM | POA: Diagnosis not present

## 2020-01-19 DIAGNOSIS — M1990 Unspecified osteoarthritis, unspecified site: Secondary | ICD-10-CM | POA: Diagnosis not present

## 2020-01-19 DIAGNOSIS — N184 Chronic kidney disease, stage 4 (severe): Secondary | ICD-10-CM | POA: Diagnosis not present

## 2020-01-19 DIAGNOSIS — D631 Anemia in chronic kidney disease: Secondary | ICD-10-CM | POA: Diagnosis not present

## 2020-01-19 DIAGNOSIS — R531 Weakness: Secondary | ICD-10-CM | POA: Diagnosis not present

## 2020-01-19 DIAGNOSIS — I5022 Chronic systolic (congestive) heart failure: Secondary | ICD-10-CM | POA: Diagnosis not present

## 2020-01-19 DIAGNOSIS — I130 Hypertensive heart and chronic kidney disease with heart failure and stage 1 through stage 4 chronic kidney disease, or unspecified chronic kidney disease: Secondary | ICD-10-CM | POA: Diagnosis not present

## 2020-01-23 ENCOUNTER — Ambulatory Visit
Admission: RE | Admit: 2020-01-23 | Discharge: 2020-01-23 | Disposition: A | Discharge status: Home / Self Care | Payer: Medicare HMO | Source: Ambulatory Visit | Attending: Nephrology | Admitting: Nephrology

## 2020-01-23 ENCOUNTER — Other Ambulatory Visit: Payer: Self-pay

## 2020-01-23 DIAGNOSIS — I509 Heart failure, unspecified: Secondary | ICD-10-CM | POA: Diagnosis not present

## 2020-01-23 DIAGNOSIS — E039 Hypothyroidism, unspecified: Secondary | ICD-10-CM | POA: Diagnosis not present

## 2020-01-23 DIAGNOSIS — Z1231 Encounter for screening mammogram for malignant neoplasm of breast: Secondary | ICD-10-CM | POA: Diagnosis not present

## 2020-01-23 DIAGNOSIS — N184 Chronic kidney disease, stage 4 (severe): Secondary | ICD-10-CM | POA: Diagnosis not present

## 2020-01-23 DIAGNOSIS — I10 Essential (primary) hypertension: Secondary | ICD-10-CM | POA: Diagnosis not present

## 2020-01-23 DIAGNOSIS — M1990 Unspecified osteoarthritis, unspecified site: Secondary | ICD-10-CM | POA: Diagnosis not present

## 2020-01-23 DIAGNOSIS — M810 Age-related osteoporosis without current pathological fracture: Secondary | ICD-10-CM | POA: Diagnosis not present

## 2020-01-23 DIAGNOSIS — I129 Hypertensive chronic kidney disease with stage 1 through stage 4 chronic kidney disease, or unspecified chronic kidney disease: Secondary | ICD-10-CM | POA: Diagnosis not present

## 2020-01-23 DIAGNOSIS — E7800 Pure hypercholesterolemia, unspecified: Secondary | ICD-10-CM | POA: Diagnosis not present

## 2020-01-24 ENCOUNTER — Ambulatory Visit (HOSPITAL_COMMUNITY)
Admission: RE | Admit: 2020-01-24 | Discharge: 2020-01-24 | Disposition: A | Discharge status: Home / Self Care | Payer: Medicare HMO | Source: Ambulatory Visit | Attending: Nephrology | Admitting: Nephrology

## 2020-01-24 ENCOUNTER — Ambulatory Visit: Payer: Medicare HMO

## 2020-01-24 VITALS — BP 165/51 | HR 58 | Temp 96.2°F | Resp 20

## 2020-01-24 DIAGNOSIS — D631 Anemia in chronic kidney disease: Secondary | ICD-10-CM | POA: Diagnosis present

## 2020-01-24 DIAGNOSIS — N184 Chronic kidney disease, stage 4 (severe): Secondary | ICD-10-CM | POA: Insufficient documentation

## 2020-01-24 LAB — IRON AND TIBC
Iron: 113 ug/dL (ref 28–170)
Saturation Ratios: 38 % — ABNORMAL HIGH (ref 10.4–31.8)
TIBC: 294 ug/dL (ref 250–450)
UIBC: 181 ug/dL

## 2020-01-24 LAB — POCT HEMOGLOBIN-HEMACUE: Hemoglobin: 11.1 g/dL — ABNORMAL LOW (ref 12.0–15.0)

## 2020-01-24 LAB — FERRITIN: Ferritin: 398 ng/mL — ABNORMAL HIGH (ref 11–307)

## 2020-01-24 MED ORDER — EPOETIN ALFA-EPBX 10000 UNIT/ML IJ SOLN
20000.0000 [IU] | INTRAMUSCULAR | Status: DC
  Administered 2020-01-24: 20000 [IU] via SUBCUTANEOUS

## 2020-01-24 MED ORDER — EPOETIN ALFA-EPBX 10000 UNIT/ML IJ SOLN
INTRAMUSCULAR | Status: AC
  Filled 2020-01-24: qty 2

## 2020-01-25 DIAGNOSIS — N184 Chronic kidney disease, stage 4 (severe): Secondary | ICD-10-CM | POA: Diagnosis not present

## 2020-01-25 DIAGNOSIS — E7800 Pure hypercholesterolemia, unspecified: Secondary | ICD-10-CM | POA: Diagnosis not present

## 2020-01-25 DIAGNOSIS — I130 Hypertensive heart and chronic kidney disease with heart failure and stage 1 through stage 4 chronic kidney disease, or unspecified chronic kidney disease: Secondary | ICD-10-CM | POA: Diagnosis not present

## 2020-01-25 DIAGNOSIS — M810 Age-related osteoporosis without current pathological fracture: Secondary | ICD-10-CM | POA: Diagnosis not present

## 2020-01-25 DIAGNOSIS — I5022 Chronic systolic (congestive) heart failure: Secondary | ICD-10-CM | POA: Diagnosis not present

## 2020-01-25 DIAGNOSIS — R42 Dizziness and giddiness: Secondary | ICD-10-CM | POA: Diagnosis not present

## 2020-01-25 DIAGNOSIS — K5790 Diverticulosis of intestine, part unspecified, without perforation or abscess without bleeding: Secondary | ICD-10-CM | POA: Diagnosis not present

## 2020-01-25 DIAGNOSIS — D631 Anemia in chronic kidney disease: Secondary | ICD-10-CM | POA: Diagnosis not present

## 2020-01-25 DIAGNOSIS — M1990 Unspecified osteoarthritis, unspecified site: Secondary | ICD-10-CM | POA: Diagnosis not present

## 2020-01-25 DIAGNOSIS — R531 Weakness: Secondary | ICD-10-CM | POA: Diagnosis not present

## 2020-01-26 DIAGNOSIS — E039 Hypothyroidism, unspecified: Secondary | ICD-10-CM | POA: Diagnosis not present

## 2020-01-26 DIAGNOSIS — M1990 Unspecified osteoarthritis, unspecified site: Secondary | ICD-10-CM | POA: Diagnosis not present

## 2020-01-26 DIAGNOSIS — G4700 Insomnia, unspecified: Secondary | ICD-10-CM | POA: Diagnosis not present

## 2020-01-26 DIAGNOSIS — N184 Chronic kidney disease, stage 4 (severe): Secondary | ICD-10-CM | POA: Diagnosis not present

## 2020-01-26 DIAGNOSIS — D631 Anemia in chronic kidney disease: Secondary | ICD-10-CM | POA: Diagnosis not present

## 2020-01-26 DIAGNOSIS — Z008 Encounter for other general examination: Secondary | ICD-10-CM | POA: Diagnosis not present

## 2020-01-26 DIAGNOSIS — I5022 Chronic systolic (congestive) heart failure: Secondary | ICD-10-CM | POA: Diagnosis not present

## 2020-01-26 DIAGNOSIS — R42 Dizziness and giddiness: Secondary | ICD-10-CM | POA: Diagnosis not present

## 2020-01-26 DIAGNOSIS — I509 Heart failure, unspecified: Secondary | ICD-10-CM | POA: Diagnosis not present

## 2020-01-26 DIAGNOSIS — K5790 Diverticulosis of intestine, part unspecified, without perforation or abscess without bleeding: Secondary | ICD-10-CM | POA: Diagnosis not present

## 2020-01-26 DIAGNOSIS — M810 Age-related osteoporosis without current pathological fracture: Secondary | ICD-10-CM | POA: Diagnosis not present

## 2020-01-26 DIAGNOSIS — R69 Illness, unspecified: Secondary | ICD-10-CM | POA: Diagnosis not present

## 2020-01-26 DIAGNOSIS — R531 Weakness: Secondary | ICD-10-CM | POA: Diagnosis not present

## 2020-01-26 DIAGNOSIS — G3184 Mild cognitive impairment, so stated: Secondary | ICD-10-CM | POA: Diagnosis not present

## 2020-01-26 DIAGNOSIS — I130 Hypertensive heart and chronic kidney disease with heart failure and stage 1 through stage 4 chronic kidney disease, or unspecified chronic kidney disease: Secondary | ICD-10-CM | POA: Diagnosis not present

## 2020-01-26 DIAGNOSIS — E261 Secondary hyperaldosteronism: Secondary | ICD-10-CM | POA: Diagnosis not present

## 2020-01-26 DIAGNOSIS — E7800 Pure hypercholesterolemia, unspecified: Secondary | ICD-10-CM | POA: Diagnosis not present

## 2020-01-26 DIAGNOSIS — E785 Hyperlipidemia, unspecified: Secondary | ICD-10-CM | POA: Diagnosis not present

## 2020-01-26 DIAGNOSIS — G8929 Other chronic pain: Secondary | ICD-10-CM | POA: Diagnosis not present

## 2020-01-29 DIAGNOSIS — M1990 Unspecified osteoarthritis, unspecified site: Secondary | ICD-10-CM | POA: Diagnosis not present

## 2020-01-29 DIAGNOSIS — K5790 Diverticulosis of intestine, part unspecified, without perforation or abscess without bleeding: Secondary | ICD-10-CM | POA: Diagnosis not present

## 2020-01-29 DIAGNOSIS — M810 Age-related osteoporosis without current pathological fracture: Secondary | ICD-10-CM | POA: Diagnosis not present

## 2020-01-29 DIAGNOSIS — E7800 Pure hypercholesterolemia, unspecified: Secondary | ICD-10-CM | POA: Diagnosis not present

## 2020-01-29 DIAGNOSIS — N184 Chronic kidney disease, stage 4 (severe): Secondary | ICD-10-CM | POA: Diagnosis not present

## 2020-01-29 DIAGNOSIS — I130 Hypertensive heart and chronic kidney disease with heart failure and stage 1 through stage 4 chronic kidney disease, or unspecified chronic kidney disease: Secondary | ICD-10-CM | POA: Diagnosis not present

## 2020-01-29 DIAGNOSIS — D631 Anemia in chronic kidney disease: Secondary | ICD-10-CM | POA: Diagnosis not present

## 2020-01-29 DIAGNOSIS — R42 Dizziness and giddiness: Secondary | ICD-10-CM | POA: Diagnosis not present

## 2020-01-29 DIAGNOSIS — I5022 Chronic systolic (congestive) heart failure: Secondary | ICD-10-CM | POA: Diagnosis not present

## 2020-01-29 DIAGNOSIS — R531 Weakness: Secondary | ICD-10-CM | POA: Diagnosis not present

## 2020-01-30 DIAGNOSIS — M810 Age-related osteoporosis without current pathological fracture: Secondary | ICD-10-CM | POA: Diagnosis not present

## 2020-01-30 DIAGNOSIS — D631 Anemia in chronic kidney disease: Secondary | ICD-10-CM | POA: Diagnosis not present

## 2020-01-30 DIAGNOSIS — K5790 Diverticulosis of intestine, part unspecified, without perforation or abscess without bleeding: Secondary | ICD-10-CM | POA: Diagnosis not present

## 2020-01-30 DIAGNOSIS — N184 Chronic kidney disease, stage 4 (severe): Secondary | ICD-10-CM | POA: Diagnosis not present

## 2020-01-30 DIAGNOSIS — R531 Weakness: Secondary | ICD-10-CM | POA: Diagnosis not present

## 2020-01-30 DIAGNOSIS — I5022 Chronic systolic (congestive) heart failure: Secondary | ICD-10-CM | POA: Diagnosis not present

## 2020-01-30 DIAGNOSIS — R69 Illness, unspecified: Secondary | ICD-10-CM | POA: Diagnosis not present

## 2020-01-30 DIAGNOSIS — R42 Dizziness and giddiness: Secondary | ICD-10-CM | POA: Diagnosis not present

## 2020-01-30 DIAGNOSIS — E7800 Pure hypercholesterolemia, unspecified: Secondary | ICD-10-CM | POA: Diagnosis not present

## 2020-01-30 DIAGNOSIS — M1990 Unspecified osteoarthritis, unspecified site: Secondary | ICD-10-CM | POA: Diagnosis not present

## 2020-02-02 DIAGNOSIS — D631 Anemia in chronic kidney disease: Secondary | ICD-10-CM | POA: Diagnosis not present

## 2020-02-02 DIAGNOSIS — M1990 Unspecified osteoarthritis, unspecified site: Secondary | ICD-10-CM | POA: Diagnosis not present

## 2020-02-02 DIAGNOSIS — R42 Dizziness and giddiness: Secondary | ICD-10-CM | POA: Diagnosis not present

## 2020-02-02 DIAGNOSIS — I5022 Chronic systolic (congestive) heart failure: Secondary | ICD-10-CM | POA: Diagnosis not present

## 2020-02-02 DIAGNOSIS — M810 Age-related osteoporosis without current pathological fracture: Secondary | ICD-10-CM | POA: Diagnosis not present

## 2020-02-02 DIAGNOSIS — R531 Weakness: Secondary | ICD-10-CM | POA: Diagnosis not present

## 2020-02-02 DIAGNOSIS — E7800 Pure hypercholesterolemia, unspecified: Secondary | ICD-10-CM | POA: Diagnosis not present

## 2020-02-02 DIAGNOSIS — N184 Chronic kidney disease, stage 4 (severe): Secondary | ICD-10-CM | POA: Diagnosis not present

## 2020-02-02 DIAGNOSIS — K5790 Diverticulosis of intestine, part unspecified, without perforation or abscess without bleeding: Secondary | ICD-10-CM | POA: Diagnosis not present

## 2020-02-02 DIAGNOSIS — I130 Hypertensive heart and chronic kidney disease with heart failure and stage 1 through stage 4 chronic kidney disease, or unspecified chronic kidney disease: Secondary | ICD-10-CM | POA: Diagnosis not present

## 2020-02-05 DIAGNOSIS — I5022 Chronic systolic (congestive) heart failure: Secondary | ICD-10-CM | POA: Diagnosis not present

## 2020-02-05 DIAGNOSIS — R42 Dizziness and giddiness: Secondary | ICD-10-CM | POA: Diagnosis not present

## 2020-02-05 DIAGNOSIS — E7800 Pure hypercholesterolemia, unspecified: Secondary | ICD-10-CM | POA: Diagnosis not present

## 2020-02-05 DIAGNOSIS — I130 Hypertensive heart and chronic kidney disease with heart failure and stage 1 through stage 4 chronic kidney disease, or unspecified chronic kidney disease: Secondary | ICD-10-CM | POA: Diagnosis not present

## 2020-02-05 DIAGNOSIS — R531 Weakness: Secondary | ICD-10-CM | POA: Diagnosis not present

## 2020-02-05 DIAGNOSIS — D631 Anemia in chronic kidney disease: Secondary | ICD-10-CM | POA: Diagnosis not present

## 2020-02-05 DIAGNOSIS — M810 Age-related osteoporosis without current pathological fracture: Secondary | ICD-10-CM | POA: Diagnosis not present

## 2020-02-05 DIAGNOSIS — M1990 Unspecified osteoarthritis, unspecified site: Secondary | ICD-10-CM | POA: Diagnosis not present

## 2020-02-05 DIAGNOSIS — N184 Chronic kidney disease, stage 4 (severe): Secondary | ICD-10-CM | POA: Diagnosis not present

## 2020-02-05 DIAGNOSIS — K5790 Diverticulosis of intestine, part unspecified, without perforation or abscess without bleeding: Secondary | ICD-10-CM | POA: Diagnosis not present

## 2020-02-06 ENCOUNTER — Ambulatory Visit: Payer: Medicare HMO | Attending: Internal Medicine

## 2020-02-06 DIAGNOSIS — Z23 Encounter for immunization: Secondary | ICD-10-CM

## 2020-02-06 NOTE — Progress Notes (Signed)
   Covid-19 Vaccination Clinic  Name:  Alicia Sellers    MRN: [id number] DOB: 09-Sep-1929  02/06/2020  [name] was observed post Covid-19 immunization for 15 minutes without incident. She was provided with Vaccine Information Sheet and instruction to access the V-Safe system.   [name] was instructed to call 911 with any severe reactions post vaccine: Marland Kitchen Difficulty breathing  . Swelling of face and throat  . A fast heartbeat  . A bad rash all over body  . Dizziness and weakness   Immunizations Administered    Name Date Dose VIS Date Route   Pfizer COVID-19 Vaccine 02/06/2020 10:30 AM 0.3 mL 11/17/2019 Intramuscular   Manufacturer: [place]   Lot: [id number]   [place]: [id number]

## 2020-02-07 ENCOUNTER — Other Ambulatory Visit: Payer: Self-pay

## 2020-02-07 ENCOUNTER — Encounter (HOSPITAL_COMMUNITY)
Admission: RE | Admit: 2020-02-07 | Discharge: 2020-02-07 | Disposition: A | Discharge status: Home / Self Care | Payer: Medicare HMO | Source: Ambulatory Visit | Attending: Nephrology | Admitting: Nephrology

## 2020-02-07 VITALS — BP 181/60 | HR 60 | Temp 96.7°F | Resp 20

## 2020-02-07 DIAGNOSIS — D631 Anemia in chronic kidney disease: Secondary | ICD-10-CM | POA: Diagnosis present

## 2020-02-07 DIAGNOSIS — N184 Chronic kidney disease, stage 4 (severe): Secondary | ICD-10-CM | POA: Diagnosis not present

## 2020-02-07 DIAGNOSIS — E034 Atrophy of thyroid (acquired): Secondary | ICD-10-CM | POA: Diagnosis not present

## 2020-02-07 DIAGNOSIS — R0602 Shortness of breath: Secondary | ICD-10-CM | POA: Diagnosis not present

## 2020-02-07 DIAGNOSIS — I5022 Chronic systolic (congestive) heart failure: Secondary | ICD-10-CM | POA: Diagnosis not present

## 2020-02-07 LAB — POCT HEMOGLOBIN-HEMACUE: Hemoglobin: 12.1 g/dL (ref 12.0–15.0)

## 2020-02-07 MED ORDER — EPOETIN ALFA-EPBX 10000 UNIT/ML IJ SOLN: 20000.0000 [IU] | INTRAMUSCULAR | Status: DC

## 2020-02-07 MED ORDER — EPOETIN ALFA-EPBX 10000 UNIT/ML IJ SOLN
INTRAMUSCULAR | Status: AC
  Filled 2020-02-07: qty 2

## 2020-02-08 DIAGNOSIS — I5022 Chronic systolic (congestive) heart failure: Secondary | ICD-10-CM | POA: Diagnosis not present

## 2020-02-08 DIAGNOSIS — K5790 Diverticulosis of intestine, part unspecified, without perforation or abscess without bleeding: Secondary | ICD-10-CM | POA: Diagnosis not present

## 2020-02-08 DIAGNOSIS — N184 Chronic kidney disease, stage 4 (severe): Secondary | ICD-10-CM | POA: Diagnosis not present

## 2020-02-08 DIAGNOSIS — E7800 Pure hypercholesterolemia, unspecified: Secondary | ICD-10-CM | POA: Diagnosis not present

## 2020-02-08 DIAGNOSIS — I130 Hypertensive heart and chronic kidney disease with heart failure and stage 1 through stage 4 chronic kidney disease, or unspecified chronic kidney disease: Secondary | ICD-10-CM | POA: Diagnosis not present

## 2020-02-08 DIAGNOSIS — R42 Dizziness and giddiness: Secondary | ICD-10-CM | POA: Diagnosis not present

## 2020-02-08 DIAGNOSIS — R531 Weakness: Secondary | ICD-10-CM | POA: Diagnosis not present

## 2020-02-08 DIAGNOSIS — M1990 Unspecified osteoarthritis, unspecified site: Secondary | ICD-10-CM | POA: Diagnosis not present

## 2020-02-08 DIAGNOSIS — D631 Anemia in chronic kidney disease: Secondary | ICD-10-CM | POA: Diagnosis not present

## 2020-02-08 DIAGNOSIS — M810 Age-related osteoporosis without current pathological fracture: Secondary | ICD-10-CM | POA: Diagnosis not present

## 2020-02-14 DIAGNOSIS — I130 Hypertensive heart and chronic kidney disease with heart failure and stage 1 through stage 4 chronic kidney disease, or unspecified chronic kidney disease: Secondary | ICD-10-CM | POA: Diagnosis not present

## 2020-02-14 DIAGNOSIS — I129 Hypertensive chronic kidney disease with stage 1 through stage 4 chronic kidney disease, or unspecified chronic kidney disease: Secondary | ICD-10-CM | POA: Diagnosis not present

## 2020-02-14 DIAGNOSIS — R42 Dizziness and giddiness: Secondary | ICD-10-CM | POA: Diagnosis not present

## 2020-02-14 DIAGNOSIS — D631 Anemia in chronic kidney disease: Secondary | ICD-10-CM | POA: Diagnosis not present

## 2020-02-14 DIAGNOSIS — M810 Age-related osteoporosis without current pathological fracture: Secondary | ICD-10-CM | POA: Diagnosis not present

## 2020-02-14 DIAGNOSIS — I509 Heart failure, unspecified: Secondary | ICD-10-CM | POA: Diagnosis not present

## 2020-02-14 DIAGNOSIS — E039 Hypothyroidism, unspecified: Secondary | ICD-10-CM | POA: Diagnosis not present

## 2020-02-14 DIAGNOSIS — E7800 Pure hypercholesterolemia, unspecified: Secondary | ICD-10-CM | POA: Diagnosis not present

## 2020-02-14 DIAGNOSIS — N184 Chronic kidney disease, stage 4 (severe): Secondary | ICD-10-CM | POA: Diagnosis not present

## 2020-02-14 DIAGNOSIS — K5790 Diverticulosis of intestine, part unspecified, without perforation or abscess without bleeding: Secondary | ICD-10-CM | POA: Diagnosis not present

## 2020-02-14 DIAGNOSIS — M1990 Unspecified osteoarthritis, unspecified site: Secondary | ICD-10-CM | POA: Diagnosis not present

## 2020-02-14 DIAGNOSIS — I10 Essential (primary) hypertension: Secondary | ICD-10-CM | POA: Diagnosis not present

## 2020-02-14 DIAGNOSIS — I5022 Chronic systolic (congestive) heart failure: Secondary | ICD-10-CM | POA: Diagnosis not present

## 2020-02-14 DIAGNOSIS — R531 Weakness: Secondary | ICD-10-CM | POA: Diagnosis not present

## 2020-02-21 ENCOUNTER — Encounter (HOSPITAL_COMMUNITY)
Admission: RE | Admit: 2020-02-21 | Discharge: 2020-02-21 | Disposition: A | Discharge status: Home / Self Care | Payer: Medicare HMO | Source: Ambulatory Visit | Attending: Nephrology | Admitting: Nephrology

## 2020-02-21 ENCOUNTER — Other Ambulatory Visit: Payer: Self-pay

## 2020-02-21 VITALS — BP 169/57 | HR 61 | Temp 97.0°F | Resp 20

## 2020-02-21 DIAGNOSIS — N184 Chronic kidney disease, stage 4 (severe): Secondary | ICD-10-CM | POA: Diagnosis not present

## 2020-02-21 DIAGNOSIS — D631 Anemia in chronic kidney disease: Secondary | ICD-10-CM

## 2020-02-21 LAB — IRON AND TIBC
Iron: 207 ug/dL — ABNORMAL HIGH (ref 28–170)
Saturation Ratios: 75 % — ABNORMAL HIGH (ref 10.4–31.8)
TIBC: 274 ug/dL (ref 250–450)
UIBC: 67 ug/dL

## 2020-02-21 LAB — POCT HEMOGLOBIN-HEMACUE: Hemoglobin: 11.3 g/dL — ABNORMAL LOW (ref 12.0–15.0)

## 2020-02-21 LAB — FERRITIN: Ferritin: 392 ng/mL — ABNORMAL HIGH (ref 11–307)

## 2020-02-21 MED ORDER — EPOETIN ALFA-EPBX 10000 UNIT/ML IJ SOLN
20000.0000 [IU] | INTRAMUSCULAR | Status: DC
  Administered 2020-02-21: 20000 [IU] via SUBCUTANEOUS

## 2020-02-21 MED ORDER — EPOETIN ALFA-EPBX 10000 UNIT/ML IJ SOLN
INTRAMUSCULAR | Status: AC
  Filled 2020-02-21: qty 2

## 2020-02-27 DIAGNOSIS — I129 Hypertensive chronic kidney disease with stage 1 through stage 4 chronic kidney disease, or unspecified chronic kidney disease: Secondary | ICD-10-CM | POA: Diagnosis not present

## 2020-02-27 DIAGNOSIS — N184 Chronic kidney disease, stage 4 (severe): Secondary | ICD-10-CM | POA: Diagnosis not present

## 2020-02-27 DIAGNOSIS — E039 Hypothyroidism, unspecified: Secondary | ICD-10-CM | POA: Diagnosis not present

## 2020-02-27 DIAGNOSIS — N2581 Secondary hyperparathyroidism of renal origin: Secondary | ICD-10-CM | POA: Diagnosis not present

## 2020-02-27 DIAGNOSIS — N189 Chronic kidney disease, unspecified: Secondary | ICD-10-CM | POA: Diagnosis not present

## 2020-02-27 DIAGNOSIS — D519 Vitamin B12 deficiency anemia, unspecified: Secondary | ICD-10-CM | POA: Diagnosis not present

## 2020-02-27 DIAGNOSIS — E785 Hyperlipidemia, unspecified: Secondary | ICD-10-CM | POA: Diagnosis not present

## 2020-03-06 ENCOUNTER — Other Ambulatory Visit: Payer: Self-pay

## 2020-03-06 ENCOUNTER — Encounter (HOSPITAL_COMMUNITY)
Admission: RE | Admit: 2020-03-06 | Discharge: 2020-03-06 | Disposition: A | Discharge status: Home / Self Care | Payer: Medicare HMO | Source: Ambulatory Visit | Attending: Nephrology | Admitting: Nephrology

## 2020-03-06 VITALS — BP 177/48 | HR 57 | Temp 96.5°F | Resp 20

## 2020-03-06 DIAGNOSIS — N184 Chronic kidney disease, stage 4 (severe): Secondary | ICD-10-CM

## 2020-03-06 DIAGNOSIS — D631 Anemia in chronic kidney disease: Secondary | ICD-10-CM

## 2020-03-06 LAB — POCT HEMOGLOBIN-HEMACUE: Hemoglobin: 9.8 g/dL — ABNORMAL LOW (ref 12.0–15.0)

## 2020-03-06 MED ORDER — EPOETIN ALFA-EPBX 10000 UNIT/ML IJ SOLN
INTRAMUSCULAR | Status: AC
  Administered 2020-03-06: 20000 [IU] via SUBCUTANEOUS
  Filled 2020-03-06: qty 2

## 2020-03-06 MED ORDER — EPOETIN ALFA-EPBX 10000 UNIT/ML IJ SOLN: 20000.0000 [IU] | INTRAMUSCULAR | Status: DC

## 2020-03-20 ENCOUNTER — Other Ambulatory Visit: Payer: Self-pay

## 2020-03-20 ENCOUNTER — Ambulatory Visit (HOSPITAL_COMMUNITY)
Admission: RE | Admit: 2020-03-20 | Discharge: 2020-03-20 | Disposition: A | Discharge status: Home / Self Care | Payer: Medicare HMO | Source: Ambulatory Visit | Attending: Nephrology | Admitting: Nephrology

## 2020-03-20 VITALS — BP 169/49 | HR 57

## 2020-03-20 DIAGNOSIS — D631 Anemia in chronic kidney disease: Secondary | ICD-10-CM | POA: Diagnosis present

## 2020-03-20 DIAGNOSIS — N184 Chronic kidney disease, stage 4 (severe): Secondary | ICD-10-CM | POA: Diagnosis not present

## 2020-03-20 LAB — IRON AND TIBC
Iron: 98 ug/dL (ref 28–170)
Saturation Ratios: 35 % — ABNORMAL HIGH (ref 10.4–31.8)
TIBC: 281 ug/dL (ref 250–450)
UIBC: 183 ug/dL

## 2020-03-20 LAB — FERRITIN: Ferritin: 347 ng/mL — ABNORMAL HIGH (ref 11–307)

## 2020-03-20 LAB — POCT HEMOGLOBIN-HEMACUE: Hemoglobin: 9.6 g/dL — ABNORMAL LOW (ref 12.0–15.0)

## 2020-03-20 MED ORDER — EPOETIN ALFA-EPBX 10000 UNIT/ML IJ SOLN: 20000.0000 [IU] | INTRAMUSCULAR | Status: DC

## 2020-03-20 MED ORDER — EPOETIN ALFA-EPBX 10000 UNIT/ML IJ SOLN
INTRAMUSCULAR | Status: AC
  Administered 2020-03-20: 20000 [IU] via SUBCUTANEOUS
  Filled 2020-03-20: qty 2

## 2020-03-22 DIAGNOSIS — I10 Essential (primary) hypertension: Secondary | ICD-10-CM | POA: Diagnosis not present

## 2020-03-22 DIAGNOSIS — N184 Chronic kidney disease, stage 4 (severe): Secondary | ICD-10-CM | POA: Diagnosis not present

## 2020-03-22 DIAGNOSIS — E039 Hypothyroidism, unspecified: Secondary | ICD-10-CM | POA: Diagnosis not present

## 2020-03-22 DIAGNOSIS — E7800 Pure hypercholesterolemia, unspecified: Secondary | ICD-10-CM | POA: Diagnosis not present

## 2020-03-22 DIAGNOSIS — M1990 Unspecified osteoarthritis, unspecified site: Secondary | ICD-10-CM | POA: Diagnosis not present

## 2020-03-22 DIAGNOSIS — I509 Heart failure, unspecified: Secondary | ICD-10-CM | POA: Diagnosis not present

## 2020-03-22 DIAGNOSIS — I129 Hypertensive chronic kidney disease with stage 1 through stage 4 chronic kidney disease, or unspecified chronic kidney disease: Secondary | ICD-10-CM | POA: Diagnosis not present

## 2020-03-22 DIAGNOSIS — M810 Age-related osteoporosis without current pathological fracture: Secondary | ICD-10-CM | POA: Diagnosis not present

## 2020-04-03 ENCOUNTER — Ambulatory Visit (HOSPITAL_COMMUNITY)
Admission: RE | Admit: 2020-04-03 | Discharge: 2020-04-03 | Disposition: A | Discharge status: Home / Self Care | Payer: Medicare HMO | Source: Ambulatory Visit | Attending: Nephrology | Admitting: Nephrology

## 2020-04-03 ENCOUNTER — Other Ambulatory Visit: Payer: Self-pay

## 2020-04-03 VITALS — BP 171/58 | HR 54 | Temp 97.1°F | Resp 20

## 2020-04-03 DIAGNOSIS — D631 Anemia in chronic kidney disease: Secondary | ICD-10-CM | POA: Insufficient documentation

## 2020-04-03 DIAGNOSIS — N184 Chronic kidney disease, stage 4 (severe): Secondary | ICD-10-CM | POA: Diagnosis not present

## 2020-04-03 LAB — POCT HEMOGLOBIN-HEMACUE: Hemoglobin: 9.4 g/dL — ABNORMAL LOW (ref 12.0–15.0)

## 2020-04-03 MED ORDER — EPOETIN ALFA-EPBX 10000 UNIT/ML IJ SOLN
INTRAMUSCULAR | Status: AC
  Administered 2020-04-03: 20000 [IU] via SUBCUTANEOUS
  Filled 2020-04-03: qty 2

## 2020-04-03 MED ORDER — EPOETIN ALFA-EPBX 10000 UNIT/ML IJ SOLN: 20000.0000 [IU] | INTRAMUSCULAR | Status: DC

## 2020-04-08 DIAGNOSIS — E785 Hyperlipidemia, unspecified: Secondary | ICD-10-CM | POA: Diagnosis not present

## 2020-04-08 DIAGNOSIS — D519 Vitamin B12 deficiency anemia, unspecified: Secondary | ICD-10-CM | POA: Diagnosis not present

## 2020-04-08 DIAGNOSIS — N2581 Secondary hyperparathyroidism of renal origin: Secondary | ICD-10-CM | POA: Diagnosis not present

## 2020-04-08 DIAGNOSIS — N184 Chronic kidney disease, stage 4 (severe): Secondary | ICD-10-CM | POA: Diagnosis not present

## 2020-04-08 DIAGNOSIS — E039 Hypothyroidism, unspecified: Secondary | ICD-10-CM | POA: Diagnosis not present

## 2020-04-08 DIAGNOSIS — N189 Chronic kidney disease, unspecified: Secondary | ICD-10-CM | POA: Diagnosis not present

## 2020-04-08 DIAGNOSIS — I129 Hypertensive chronic kidney disease with stage 1 through stage 4 chronic kidney disease, or unspecified chronic kidney disease: Secondary | ICD-10-CM | POA: Diagnosis not present

## 2020-04-10 ENCOUNTER — Ambulatory Visit: Payer: Medicare HMO | Admitting: Podiatry

## 2020-04-10 ENCOUNTER — Encounter: Payer: Self-pay | Admitting: Podiatry

## 2020-04-10 ENCOUNTER — Other Ambulatory Visit: Payer: Self-pay

## 2020-04-10 DIAGNOSIS — L600 Ingrowing nail: Secondary | ICD-10-CM | POA: Diagnosis not present

## 2020-04-10 MED ORDER — NEOMYCIN-POLYMYXIN-HC 3.5-10000-1 OT SOLN: OTIC | 0 refills | Status: DC

## 2020-04-10 NOTE — Patient Instructions (Signed)

## 2020-04-10 NOTE — Progress Notes (Signed)
Subjective:   Patient ID: Alicia Sellers, female   DOB: 86 y.o.   MRN: [id number]   HPI Patient presents with chronic ingrown toenail of the left big toe and presents with caregiver.  States it is been present this time for a month and she has had numerous histories of this problem over period of time and tries to trim it out herself and also several other and groans but not to the same degree.  Patient does not smoke likes to be active   Review of Systems  All other systems reviewed and are negative.       Objective:  Physical Exam Vitals and nursing note reviewed.  Constitutional:      Appearance: She is well-developed.  Pulmonary:     Effort: Pulmonary effort is normal.  Musculoskeletal:        General: Normal range of motion.  Skin:    General: Skin is warm.  Neurological:     Mental Status: She is alert.     Neurovascular status was found to be intact muscle strength found to be adequate.  Patient is found to have incurvated left hallux lateral border that is painful when pressed with distal irritation of the tissue but no redness no active drainage noted with other nails being structurally thickened at this time     Assessment:  Chronic ingrown toenail deformity left hallux lateral border with other nails being somewhat incurvated in the corners but not to the same degree     Plan:  H&P reviewed with her and caregiver condition and treatment options.  They have opted for permanent procedure and I allowed him to read consent form understanding risk and consent form is signed.  I infiltrated the left hallux 60 mg like Marcaine mixture sterile prep applied and using sterile instrumentation remove lateral border exposed matrix applied phenol 3 applications 30 seconds followed by alcohol lavage sterile dressing.  Gave instructions on soaks and leave dressing on 24 hours but take it off earlier if throbbing were to occur and drops were written and patient encouraged to call  with any questions concerns

## 2020-04-11 ENCOUNTER — Telehealth: Payer: Self-pay | Admitting: *Deleted

## 2020-04-11 NOTE — Telephone Encounter (Signed)
Pt called states the drops cost $80.00, request alternate.

## 2020-04-11 NOTE — Telephone Encounter (Signed)
I called listed number and pt's dtr-in-law answered states she called yesterday and they have pt's medication worked out.

## 2020-04-17 ENCOUNTER — Other Ambulatory Visit: Payer: Self-pay

## 2020-04-17 ENCOUNTER — Encounter (HOSPITAL_COMMUNITY)
Admission: RE | Admit: 2020-04-17 | Discharge: 2020-04-17 | Disposition: A | Discharge status: Home / Self Care | Payer: Medicare HMO | Source: Ambulatory Visit | Attending: Nephrology | Admitting: Nephrology

## 2020-04-17 VITALS — BP 152/62 | HR 53 | Temp 96.3°F | Resp 20

## 2020-04-17 DIAGNOSIS — D631 Anemia in chronic kidney disease: Secondary | ICD-10-CM | POA: Diagnosis present

## 2020-04-17 DIAGNOSIS — N184 Chronic kidney disease, stage 4 (severe): Secondary | ICD-10-CM

## 2020-04-17 LAB — IRON AND TIBC
Iron: 101 ug/dL (ref 28–170)
Saturation Ratios: 35 % — ABNORMAL HIGH (ref 10.4–31.8)
TIBC: 291 ug/dL (ref 250–450)
UIBC: 190 ug/dL

## 2020-04-17 LAB — FERRITIN: Ferritin: 305 ng/mL (ref 11–307)

## 2020-04-17 LAB — POCT HEMOGLOBIN-HEMACUE: Hemoglobin: 10.6 g/dL — ABNORMAL LOW (ref 12.0–15.0)

## 2020-04-17 MED ORDER — EPOETIN ALFA-EPBX 10000 UNIT/ML IJ SOLN: 20000.0000 [IU] | INTRAMUSCULAR | Status: DC

## 2020-04-17 MED ORDER — EPOETIN ALFA-EPBX 10000 UNIT/ML IJ SOLN
INTRAMUSCULAR | Status: AC
  Administered 2020-04-17: 20000 [IU]
  Filled 2020-04-17: qty 2

## 2020-04-23 DIAGNOSIS — E039 Hypothyroidism, unspecified: Secondary | ICD-10-CM | POA: Diagnosis not present

## 2020-04-23 DIAGNOSIS — I10 Essential (primary) hypertension: Secondary | ICD-10-CM | POA: Diagnosis not present

## 2020-04-23 DIAGNOSIS — I509 Heart failure, unspecified: Secondary | ICD-10-CM | POA: Diagnosis not present

## 2020-04-23 DIAGNOSIS — E7800 Pure hypercholesterolemia, unspecified: Secondary | ICD-10-CM | POA: Diagnosis not present

## 2020-04-23 DIAGNOSIS — I129 Hypertensive chronic kidney disease with stage 1 through stage 4 chronic kidney disease, or unspecified chronic kidney disease: Secondary | ICD-10-CM | POA: Diagnosis not present

## 2020-04-23 DIAGNOSIS — I130 Hypertensive heart and chronic kidney disease with heart failure and stage 1 through stage 4 chronic kidney disease, or unspecified chronic kidney disease: Secondary | ICD-10-CM | POA: Diagnosis not present

## 2020-04-23 DIAGNOSIS — N184 Chronic kidney disease, stage 4 (severe): Secondary | ICD-10-CM | POA: Diagnosis not present

## 2020-04-23 DIAGNOSIS — M1990 Unspecified osteoarthritis, unspecified site: Secondary | ICD-10-CM | POA: Diagnosis not present

## 2020-04-23 DIAGNOSIS — M810 Age-related osteoporosis without current pathological fracture: Secondary | ICD-10-CM | POA: Diagnosis not present

## 2020-04-25 DIAGNOSIS — I509 Heart failure, unspecified: Secondary | ICD-10-CM | POA: Diagnosis not present

## 2020-04-25 DIAGNOSIS — E039 Hypothyroidism, unspecified: Secondary | ICD-10-CM | POA: Diagnosis not present

## 2020-04-25 DIAGNOSIS — I129 Hypertensive chronic kidney disease with stage 1 through stage 4 chronic kidney disease, or unspecified chronic kidney disease: Secondary | ICD-10-CM | POA: Diagnosis not present

## 2020-04-25 DIAGNOSIS — R0989 Other specified symptoms and signs involving the circulatory and respiratory systems: Secondary | ICD-10-CM | POA: Diagnosis not present

## 2020-04-25 DIAGNOSIS — E7800 Pure hypercholesterolemia, unspecified: Secondary | ICD-10-CM | POA: Diagnosis not present

## 2020-04-25 DIAGNOSIS — L570 Actinic keratosis: Secondary | ICD-10-CM | POA: Diagnosis not present

## 2020-04-25 DIAGNOSIS — Z0000 Encounter for general adult medical examination without abnormal findings: Secondary | ICD-10-CM | POA: Diagnosis not present

## 2020-04-25 DIAGNOSIS — Z1389 Encounter for screening for other disorder: Secondary | ICD-10-CM | POA: Diagnosis not present

## 2020-04-25 DIAGNOSIS — N184 Chronic kidney disease, stage 4 (severe): Secondary | ICD-10-CM | POA: Diagnosis not present

## 2020-04-25 DIAGNOSIS — D649 Anemia, unspecified: Secondary | ICD-10-CM | POA: Diagnosis not present

## 2020-05-01 ENCOUNTER — Encounter (HOSPITAL_COMMUNITY): Payer: Medicare HMO

## 2020-05-01 ENCOUNTER — Encounter (HOSPITAL_COMMUNITY)
Admission: RE | Admit: 2020-05-01 | Discharge: 2020-05-01 | Disposition: A | Discharge status: Home / Self Care | Payer: Medicare HMO | Source: Ambulatory Visit | Attending: Nephrology | Admitting: Nephrology

## 2020-05-01 VITALS — BP 172/58 | HR 53 | Resp 20

## 2020-05-01 DIAGNOSIS — D631 Anemia in chronic kidney disease: Secondary | ICD-10-CM

## 2020-05-01 DIAGNOSIS — N184 Chronic kidney disease, stage 4 (severe): Secondary | ICD-10-CM

## 2020-05-01 LAB — POCT HEMOGLOBIN-HEMACUE: Hemoglobin: 10.6 g/dL — ABNORMAL LOW (ref 12.0–15.0)

## 2020-05-01 MED ORDER — EPOETIN ALFA-EPBX 10000 UNIT/ML IJ SOLN
INTRAMUSCULAR | Status: AC
  Administered 2020-05-01: 20000 [IU] via SUBCUTANEOUS
  Filled 2020-05-01: qty 2

## 2020-05-01 MED ORDER — EPOETIN ALFA-EPBX 10000 UNIT/ML IJ SOLN: 20000.0000 [IU] | INTRAMUSCULAR | Status: DC

## 2020-05-07 ENCOUNTER — Emergency Department (HOSPITAL_COMMUNITY)
Admission: EM | Admit: 2020-05-07 | Discharge: 2020-05-08 | Disposition: A | Discharge status: Home / Self Care | Payer: Medicare HMO | Source: Home / Self Care | Attending: Emergency Medicine | Admitting: Emergency Medicine

## 2020-05-07 ENCOUNTER — Emergency Department (HOSPITAL_COMMUNITY): Payer: Medicare HMO

## 2020-05-07 ENCOUNTER — Other Ambulatory Visit: Payer: Self-pay

## 2020-05-07 ENCOUNTER — Encounter (HOSPITAL_COMMUNITY): Payer: Self-pay | Admitting: Obstetrics and Gynecology

## 2020-05-07 DIAGNOSIS — R112 Nausea with vomiting, unspecified: Secondary | ICD-10-CM | POA: Diagnosis not present

## 2020-05-07 DIAGNOSIS — R1010 Upper abdominal pain, unspecified: Secondary | ICD-10-CM | POA: Diagnosis not present

## 2020-05-07 DIAGNOSIS — I499 Cardiac arrhythmia, unspecified: Secondary | ICD-10-CM | POA: Diagnosis not present

## 2020-05-07 DIAGNOSIS — B349 Viral infection, unspecified: Secondary | ICD-10-CM | POA: Diagnosis not present

## 2020-05-07 DIAGNOSIS — R1084 Generalized abdominal pain: Secondary | ICD-10-CM | POA: Diagnosis not present

## 2020-05-07 DIAGNOSIS — R1111 Vomiting without nausea: Secondary | ICD-10-CM | POA: Diagnosis not present

## 2020-05-07 DIAGNOSIS — K828 Other specified diseases of gallbladder: Secondary | ICD-10-CM | POA: Diagnosis not present

## 2020-05-07 DIAGNOSIS — I4510 Unspecified right bundle-branch block: Secondary | ICD-10-CM | POA: Diagnosis not present

## 2020-05-07 DIAGNOSIS — R197 Diarrhea, unspecified: Secondary | ICD-10-CM | POA: Diagnosis not present

## 2020-05-07 DIAGNOSIS — K8309 Other cholangitis: Secondary | ICD-10-CM | POA: Diagnosis not present

## 2020-05-07 DIAGNOSIS — A4151 Sepsis due to Escherichia coli [E. coli]: Secondary | ICD-10-CM | POA: Diagnosis not present

## 2020-05-07 DIAGNOSIS — R1013 Epigastric pain: Secondary | ICD-10-CM | POA: Diagnosis not present

## 2020-05-07 DIAGNOSIS — K5730 Diverticulosis of large intestine without perforation or abscess without bleeding: Secondary | ICD-10-CM | POA: Diagnosis not present

## 2020-05-07 DIAGNOSIS — R52 Pain, unspecified: Secondary | ICD-10-CM | POA: Diagnosis not present

## 2020-05-07 LAB — COMPREHENSIVE METABOLIC PANEL
ALT: 31 U/L (ref 0–44)
AST: 58 U/L — ABNORMAL HIGH (ref 15–41)
Albumin: 3.7 g/dL (ref 3.5–5.0)
Alkaline Phosphatase: 95 U/L (ref 38–126)
Anion gap: 14 (ref 5–15)
BUN: 73 mg/dL — ABNORMAL HIGH (ref 8–23)
CO2: 20 mmol/L — ABNORMAL LOW (ref 22–32)
Calcium: 9.5 mg/dL (ref 8.9–10.3)
Chloride: 106 mmol/L (ref 98–111)
Creatinine, Ser: 3.9 mg/dL — ABNORMAL HIGH (ref 0.44–1.00)
GFR calc Af Amer: 11 mL/min — ABNORMAL LOW (ref >60)
GFR calc non Af Amer: 10 mL/min — ABNORMAL LOW (ref >60)
Glucose, Bld: 108 mg/dL — ABNORMAL HIGH (ref 70–99)
Potassium: 4.7 mmol/L (ref 3.5–5.1)
Sodium: 140 mmol/L (ref 135–145)
Total Bilirubin: 1.9 mg/dL — ABNORMAL HIGH (ref 0.3–1.2)
Total Protein: 6.9 g/dL (ref 6.5–8.1)

## 2020-05-07 LAB — SARS CORONAVIRUS 2 BY RT PCR (HOSPITAL ORDER, PERFORMED IN ~~LOC~~ HOSPITAL LAB): SARS Coronavirus 2: NEGATIVE

## 2020-05-07 LAB — CBC WITH DIFFERENTIAL/PLATELET
Abs Immature Granulocytes: 0 10*3/uL (ref 0.00–0.07)
Basophils Absolute: 0 10*3/uL (ref 0.0–0.1)
Basophils Relative: 0 %
Eosinophils Absolute: 0 10*3/uL (ref 0.0–0.5)
Eosinophils Relative: 1 %
HCT: 35.5 % — ABNORMAL LOW (ref 36.0–46.0)
Hemoglobin: 11.2 g/dL — ABNORMAL LOW (ref 12.0–15.0)
Immature Granulocytes: 0 %
Lymphocytes Relative: 16 %
Lymphs Abs: 0.2 10*3/uL — ABNORMAL LOW (ref 0.7–4.0)
MCH: 32.9 pg (ref 26.0–34.0)
MCHC: 31.5 g/dL (ref 30.0–36.0)
MCV: 104.4 fL — ABNORMAL HIGH (ref 80.0–100.0)
Monocytes Absolute: 0 10*3/uL — ABNORMAL LOW (ref 0.1–1.0)
Monocytes Relative: 1 %
Neutro Abs: 1.1 10*3/uL — ABNORMAL LOW (ref 1.7–7.7)
Neutrophils Relative %: 82 %
Platelets: 284 10*3/uL (ref 150–400)
RBC: 3.4 MIL/uL — ABNORMAL LOW (ref 3.87–5.11)
RDW: 14.6 % (ref 11.5–15.5)
WBC: 1.3 10*3/uL — CL (ref 4.0–10.5)
nRBC: 1.5 % — ABNORMAL HIGH (ref 0.0–0.2)

## 2020-05-07 LAB — URINALYSIS, ROUTINE W REFLEX MICROSCOPIC
Bacteria, UA: NONE SEEN
Bilirubin Urine: NEGATIVE
Glucose, UA: NEGATIVE mg/dL
Hgb urine dipstick: NEGATIVE
Ketones, ur: NEGATIVE mg/dL
Leukocytes,Ua: NEGATIVE
Nitrite: NEGATIVE
Protein, ur: 100 mg/dL — AB
Specific Gravity, Urine: 1.012 (ref 1.005–1.030)
pH: 5 (ref 5.0–8.0)

## 2020-05-07 LAB — LIPASE, BLOOD: Lipase: 64 U/L — ABNORMAL HIGH (ref 11–51)

## 2020-05-07 LAB — LACTIC ACID, PLASMA: Lactic Acid, Venous: 1.3 mmol/L (ref 0.5–1.9)

## 2020-05-07 MED ORDER — PROMETHAZINE HCL 25 MG/ML IJ SOLN
6.2500 mg | Freq: Once | INTRAMUSCULAR | Status: AC
  Administered 2020-05-07: 6.25 mg via INTRAVENOUS
  Filled 2020-05-07: qty 1

## 2020-05-07 MED ORDER — IOHEXOL 9 MG/ML PO SOLN
ORAL | Status: AC
  Filled 2020-05-07: qty 500

## 2020-05-07 MED ORDER — ACETAMINOPHEN 325 MG PO TABS
650.0000 mg | ORAL_TABLET | Freq: Once | ORAL | Status: AC
  Administered 2020-05-07: 650 mg via ORAL
  Filled 2020-05-07: qty 2

## 2020-05-07 MED ORDER — SODIUM CHLORIDE 0.9 % IV BOLUS
500.0000 mL | Freq: Once | INTRAVENOUS | Status: AC
  Administered 2020-05-07: 500 mL via INTRAVENOUS

## 2020-05-07 MED ORDER — FENTANYL CITRATE (PF) 100 MCG/2ML IJ SOLN
25.0000 ug | Freq: Once | INTRAMUSCULAR | Status: AC
  Administered 2020-05-07: 25 ug via INTRAVENOUS
  Filled 2020-05-07: qty 2

## 2020-05-07 NOTE — ED Provider Notes (Signed)
4:50 PM-checkout from Dr. Sherry Ruffing to evaluate after completion of laboratory testing and CT imaging.  10:05 PM-ED evaluation complete.  Clinical Course as of May 07 2354  Tue May 07, 2020  2206 Normal except presence of protein  Urinalysis, Routine w reflex microscopic(!) [EW]  2206 Mild elevation  Lipase, blood(!) [EW]  2206 Normal except CO2 low, glucose high, BUN high, creatinine high, AST high, total bilirubin high, GFR low  Comprehensive metabolic panel(!) [EW]  4193 Normal except white count low, hemoglobin low, MCV high  CBC with Differential(!!) [EW]  2207 Radiologist hypertension, no colitis, distended gallbladder, with possibly distended common bile duct.  CT ABDOMEN PELVIS WO CONTRAST [EW]  2209 Patient has developed a fever, Tylenol ordered   [EW]  2210 Ultrasound ordered to evaluate for right upper quadrant problems causing symptoms.   [EW]  2336 Per radiologist, layering sludge without evidence for cholecystitis.  US Abdomen Complete [EW]  2348 Squamous Epithelial / LPF: 0-5 [EW]  2348 NEUT#(!): 1.1 [EW]  7902 Mild anemia, persistent elevation of BUN and creatinine, elevated AST, elevated bilirubin   [EW]    Clinical Course User Index [EW] Daleen Bo, MD     Patient Vitals for the past 24 hrs:  BP Temp Temp src Pulse Resp SpO2  05/07/20 2200 -- -- -- -- 16 --  05/07/20 2158 -- (!) 100.6 F (38.1 C) Rectal -- -- --  05/07/20 2139 (!) 125/55 -- -- 84 17 95 %  05/07/20 2130 (!) 125/55 -- -- 84 -- 92 %  05/07/20 2000 -- -- -- -- 17 --  05/07/20 1930 (!) 124/55 -- -- 82 -- 96 %  05/07/20 1917 (!) 139/98 -- -- 81 17 95 %  05/07/20 1716 (!) 169/54 97.8 F (36.6 C) Oral 78 18 93 %  05/07/20 1411 (!) 171/47 -- -- -- -- --  05/07/20 1409 -- 97.7 F (36.5 C) Oral (!) 59 20 96 %    11:36 PM Reevaluation with update and discussion. After initial assessment and treatment, an updated evaluation reveals patient is alert and states she will try to eat and drink  something.  She last vomited around noon today.  Updated patient and daughter on findings.  At this time the patient's abdomen is minimally tender to palpation in the right mid abdomen.  There is no rebound tenderness.  Daughter states that the patient has not had any diarrhea today.  Patient can potentially go home.  We will try nutrition trial and ambulation trial then assess for discharge. Daleen Bo   12:30 AM-she is able to drink water and eat crackers.  With ambulation trial she required 2 people and could only walk about 8 feet, and had to sit back down.  I discussed these findings with the patient's daughter who feels that she can manage the patient at home, with assistance from family members.  We discussed reasons to return, and treatments at home.  Daughter is agreeable.  She will take the patient home.  Medical Decision Making:  This patient is presenting for evaluation of abdominal pain and vomiting., which does require a range of treatment options, and is a complaint that involves a moderate risk of morbidity and mortality. The differential diagnoses include colitis, cholecystitis, viral illness. I decided to review old records, and in summary elderly female, with chronic kidney disease, who lives with family members in their home.  I obtain additional historical information from her daughter at the bedside.  Clinical Laboratory Tests Ordered, included CBC,  Metabolic panel, Urinalysis and Lactate, lipase, Covid test. Review indicates leukopenia, mild anemia, elevated lipase, CO2 low, glucose high, BUN high, creatinine high, AST high, bilirubin high. Radiologic Tests Ordered, included CT abdomen pelvis, ultrasound abdomen.  I independently Visualized: Radiologic ultrasound images images, which show distended gallbladder without signs of cholecystitis, or colitis.   Critical Interventions-clinical evaluation, laboratory testing, CT and ultrasound imaging, observation,  reassessment  After These Interventions, the Patient was reevaluated and was found stable for discharge, with requirement for ongoing management including family members at home.  Suspect viral illness of unspecified nature, no evidence for colitis or cholecystitis.  CRITICAL CARE-no Performed by: Daleen Bo  Nursing Notes Reviewed/ Care Coordinated Applicable Imaging Reviewed Interpretation of Laboratory Data incorporated into ED treatment  The patient appears reasonably screened and/or stabilized for discharge and I doubt any other medical condition or other [facility] requiring further screening, evaluation, or treatment in the ED at this time prior to discharge.  Plan: Home Medications-Tylenol every 4 hours for fever, continue current; Home Treatments-rest, fluids, assistance when up; return here if the recommended treatment, does not improve the symptoms; Recommended follow up-PCP, as needed       Daleen Bo, MD 05/08/20 [phone]

## 2020-05-07 NOTE — ED Notes (Signed)
Pt still unable to urinate. Pt states that she has the urge, but is unable to go. She states that she will call when she goes so that we can send down the urine sample.

## 2020-05-07 NOTE — ED Notes (Signed)
Pt's daughter reports pt wears depends. Patient placed on purewick to obtain urine sample

## 2020-05-07 NOTE — ED Provider Notes (Signed)
[facility] DEPT Provider Note   CSN: [id number] Arrival date & time: 05/07/20  1401     History No chief complaint on file.   Alicia Sellers is a 86 y.o. female.  The history is provided by the patient and medical records. No language interpreter was used.  Abdominal Pain Pain location:  Epigastric Pain quality: aching and cramping   Pain radiates to:  Does not radiate Pain severity:  Severe Onset quality:  Gradual Duration:  2 days Timing:  Constant Progression:  Waxing and waning Chronicity:  New Context: eating   Context: not alcohol use, not previous surgeries, not recent illness, not suspicious food intake and not trauma   Relieved by:  Nothing Worsened by:  Nothing Ineffective treatments:  None tried Associated symptoms: diarrhea, fatigue, nausea and vomiting   Associated symptoms: no chest pain, no chills, no constipation, no cough, no dysuria, no fever and no shortness of breath        Past Medical History:  Diagnosis Date  . Acute on chronic left systolic heart failure ([place]) 10/26/2019  . Anemia   . Anxiety   . Arthritis   . Chronic kidney disease   . Dyspnea   . Headache   . Hypertension   . Hypothyroidism     Patient Active Problem List   Diagnosis Date Noted  . Acute on chronic left systolic heart failure ([place]) 10/26/2019  . Metabolic acidosis, normal anion gap (NAG) 12/02/2018  . Dyspnea 12/01/2018  . HTN (hypertension) 12/01/2018  . CKD (chronic kidney disease), stage IV ([place]) 12/01/2018  . Hypothyroidism 12/01/2018  . Anxiety 12/01/2018  . Anemia due to chronic kidney disease 12/01/2018  . Arthritis 01/06/2012    Past Surgical History:  Procedure Laterality Date  . APPENDECTOMY    . BREAST BIOPSY Left 2015   benign  . EYE SURGERY    . TONSILLECTOMY       OB History   No obstetric history on file.     Family History  Problem Relation Age of Onset  . Breast cancer Neg Hx     Social History     Tobacco Use  . Smoking status: Never Smoker  . Smokeless tobacco: Never Used  Substance Use Topics  . Alcohol use: Never  . Drug use: Never    Home Medications Prior to Admission medications   Medication Sig Start Date End Date Taking? Authorizing Provider  amLODipine (NORVASC) 5 MG tablet Take 0.5 tablets (2.5 mg total) by mouth daily. 10/30/19   [name], MD  aspirin 81 MG chewable tablet Chew 81 mg by mouth daily.    [provider]  Calcium Carbonate-Vitamin D (CALCIUM 500 + D) 500-125 MG-UNIT TABS Take 2 tablets by mouth daily.     [provider]  citalopram (CELEXA) 10 MG tablet Take 10 mg by mouth daily.    [provider]  fexofenadine (ALLEGRA) 180 MG tablet Take 180 mg by mouth daily as needed for allergies.     [provider]  furosemide (LASIX) 40 MG tablet Take 1 tablet (40 mg total) by mouth daily. 10/31/19   [name], MD  gemfibrozil (LOPID) 600 MG tablet Take 600 mg by mouth 2 (two) times daily before a meal.    [provider]  hydrALAZINE (APRESOLINE) 25 MG tablet Take 25 mg by mouth 3 (three) times daily.    [provider]  levothyroxine (SYNTHROID, LEVOTHROID) 50 MCG tablet Take 50 mcg by mouth daily  before breakfast.    [provider]  LORazepam (ATIVAN) 0.5 MG tablet Take 0.25 mg by mouth at bedtime.     [provider]  meclizine (ANTIVERT) 25 MG tablet Take 25 mg by mouth 3 (three) times daily as needed for dizziness.    [provider]  metoprolol succinate (TOPROL-XL) 25 MG 24 hr tablet Take 12.5 mg by mouth daily.     [provider]  Multiple Vitamins-Minerals (MULTI COMPLETE) CAPS Take 1 tablet by mouth daily.    [provider]  neomycin-polymyxin-hydrocortisone (CORTISPORIN) OTIC solution Apply 1 to 2 drops to toe BID after soaking 04/10/20   Regal, Tamala Fothergill, DPM  Omega-3 Fatty Acids (FISH OIL) 1000 MG CAPS Take 1 capsule (1,000 mg total) by mouth  2 (two) times daily with a meal. 12/05/18   Gherghe, Vella Redhead, MD  ondansetron (ZOFRAN) 4 MG tablet Take 4 mg by mouth every 4 (four) hours as needed for nausea/vomiting. 10/23/19   [provider]  polyethylene glycol (MIRALAX / GLYCOLAX) 17 g packet Take 17 g by mouth daily. 10/31/19   [name], MD  Red Yeast Rice 600 MG TABS Take 600 tablets by mouth 2 (two) times daily.    [provider]  sodium bicarbonate 650 MG tablet Take 650 mg by mouth 2 (two) times daily. 07/19/19   [provider]  vitamin E 400 UNIT capsule Take 400 Units by mouth daily.    [provider]    Allergies    Latex and Penicillins  Review of Systems   Review of Systems  Constitutional: Positive for fatigue. Negative for chills, diaphoresis and fever.  HENT: Negative for congestion.   Respiratory: Negative for cough, chest tightness, shortness of breath, wheezing and stridor.   Cardiovascular: Negative for chest pain, palpitations and leg swelling.  Gastrointestinal: Positive for abdominal pain, diarrhea, nausea and vomiting. Negative for abdominal distention and constipation.  Genitourinary: Negative for dysuria, flank pain and frequency.  Musculoskeletal: Negative for back pain, neck pain and neck stiffness.  Skin: Negative for rash and wound.  Neurological: Negative for dizziness, light-headedness and headaches.  Psychiatric/Behavioral: Negative for agitation.  All other systems reviewed and are negative.   Physical Exam Updated Vital Signs BP (!) 171/47   Pulse (!) 59   Temp 97.7 F (36.5 C) (Oral)   Resp 20   SpO2 96%   Physical Exam Vitals and nursing note reviewed.  Constitutional:      General: She is not in acute distress.    Appearance: She is well-developed. She is not ill-appearing, toxic-appearing or diaphoretic.  HENT:     Head: Normocephalic and atraumatic.  Eyes:     General: No scleral icterus.    Extraocular Movements: Extraocular movements  intact.     Conjunctiva/sclera: Conjunctivae normal.     Pupils: Pupils are equal, round, and reactive to light.  Cardiovascular:     Rate and Rhythm: Normal rate and regular rhythm.     Heart sounds: Murmur present.  Pulmonary:     Effort: Pulmonary effort is normal. No respiratory distress.     Breath sounds: Normal breath sounds.  Abdominal:     General: Bowel sounds are normal.     Palpations: Abdomen is soft.     Tenderness: There is abdominal tenderness in the epigastric area. There is no right CVA tenderness, left CVA tenderness, guarding or rebound.  Musculoskeletal:     Cervical back: Neck supple.  Skin:    General:  Skin is warm and dry.     Capillary Refill: Capillary refill takes less than 2 seconds.     Coloration: Skin is pale. Skin is not jaundiced.     Findings: No rash.  Neurological:     General: No focal deficit present.     Mental Status: She is alert.  Psychiatric:        Mood and Affect: Mood normal.     ED Results / Procedures / Treatments   Labs (all labs ordered are listed, but only abnormal results are displayed) Labs Reviewed  CBC WITH DIFFERENTIAL/PLATELET - Abnormal; Notable for the following components:      Result Value   WBC 1.3 (*)    RBC 3.40 (*)    Hemoglobin 11.2 (*)    HCT 35.5 (*)    MCV 104.4 (*)    nRBC 1.5 (*)    Neutro Abs 1.1 (*)    Lymphs Abs 0.2 (*)    Monocytes Absolute 0.0 (*)    All other components within normal limits  COMPREHENSIVE METABOLIC PANEL - Abnormal; Notable for the following components:   CO2 20 (*)    Glucose, Bld 108 (*)    BUN 73 (*)    Creatinine, Ser 3.90 (*)    AST 58 (*)    Total Bilirubin 1.9 (*)    GFR calc non Af Amer 10 (*)    GFR calc Af Amer 11 (*)    All other components within normal limits  LIPASE, BLOOD - Abnormal; Notable for the following components:   Lipase 64 (*)    All other components within normal limits  SARS CORONAVIRUS 2 BY RT PCR (HOSPITAL ORDER, [facility])  URINE CULTURE  CULTURE, BLOOD (ROUTINE X 2)  LACTIC ACID, PLASMA  URINALYSIS, ROUTINE W REFLEX MICROSCOPIC    EKG None  Radiology No results found.  Procedures Procedures (including critical care time)  Medications Ordered in ED Medications  iohexol (OMNIPAQUE) 9 MG/ML oral solution (has no administration in time range)  fentaNYL (SUBLIMAZE) injection 25 mcg (25 mcg Intravenous Given 05/07/20 1431)  promethazine (PHENERGAN) injection 6.25 mg (6.25 mg Intravenous Given 05/07/20 1429)  sodium chloride 0.9 % bolus 500 mL (500 mLs Intravenous New Bag/Given 05/07/20 1428)    ED Course  I have reviewed the triage vital signs and the nursing notes.  Pertinent labs & imaging results that were available during my care of the patient were reviewed by me and considered in my medical decision making (see chart for details).    MDM Rules/Calculators/A&P                      Norrine Ballester is a 86 y.o. female with a past medical history significant for CKD, hypertension, hypothyroidism, anxiety, prior appendectomy, and CHF who presents with nausea, vomiting, diarrhea, and abdominal pain.  Patient reports that for the last 2 days, she has had some loose stools and then today starting nausea, vomiting, and upper abdominal discomfort.  She describes as a 9 out of 10 in severity and it is aching.  She reports the pain was worsened after she tried to eat some food this morning.  She still has her gallbladder but has had her appendix removed in the past.  She denies any trauma.  She denies any constipation or urinary symptoms.  She denies any fevers, chills, congestion, or cough.  She denies any chest pain or palpitations.  She reports  no sick contacts to her knowledge.  On exam, epigastric is very tender to palpation.  Bowel sounds were appreciated.  Lungs were clear and chest was nontender.  Patient does have a murmur.  Good pulses in extremities.  Patient resting comfortably.  Given  the patient's severe abdominal tenderness on exam, patient will have CT scan to further evaluate.  Her previous kidney function was elevated so we will recheck.  CT without contrast was ordered due to her kidney function.  We will also give the patient pain medicine, nausea medicine, and some fluids.  Clinically she does not appear fluid overloaded with dry mucous membranes and no extremity edema seen.  Will also look for other infection.  Anticipate reassessment after work-up.  Care transferred to Dr. Eulis Foster while awaiting results of labs and CT imaging of the abdomen and pelvis.  If work-up is totally reassuring and she is able to tolerate p.o. and feeling better, Anticipate discharge home with likely diagnosis of the gastroenteritis that has been going through the community.  If significant abnormalities are found, will disposition accordingly.   Final Clinical Impression(s) / ED Diagnoses Final diagnoses:  Nausea vomiting and diarrhea  Epigastric pain    Clinical Impression: 1. Nausea vomiting and diarrhea   2. Epigastric pain     Disposition: Care transferred to Dr. Eulis Foster while awaiting for results of imaging and work-up.  This note was prepared with assistance of Systems analyst. Occasional wrong-word or sound-a-like substitutions may have occurred due to the inherent limitations of voice recognition software.      Geral Coker, Gwenyth Allegra, MD 05/07/20 [id number]

## 2020-05-07 NOTE — ED Triage Notes (Signed)
Per EMS: Pt is coming form home with c/o Gi upset, N/V. Patient is alert and oriented and denies hematemesis or blood in stool.

## 2020-05-07 NOTE — ED Notes (Signed)
Pt given graham crackers and water, and verbalized understanding of the PO challenge

## 2020-05-08 ENCOUNTER — Telehealth (HOSPITAL_BASED_OUTPATIENT_CLINIC_OR_DEPARTMENT_OTHER): Payer: Self-pay | Admitting: *Deleted

## 2020-05-08 ENCOUNTER — Emergency Department (HOSPITAL_COMMUNITY): Payer: Medicare HMO

## 2020-05-08 ENCOUNTER — Other Ambulatory Visit: Payer: Self-pay

## 2020-05-08 ENCOUNTER — Inpatient Hospital Stay (HOSPITAL_COMMUNITY)
Admission: EM | Admit: 2020-05-08 | Discharge: 2020-05-11 | DRG: 872 | Disposition: A | Discharge status: Home / Self Care | Payer: Medicare HMO | Attending: Internal Medicine | Admitting: Internal Medicine

## 2020-05-08 ENCOUNTER — Encounter (HOSPITAL_COMMUNITY): Payer: Self-pay

## 2020-05-08 ENCOUNTER — Inpatient Hospital Stay (HOSPITAL_COMMUNITY): Payer: Medicare HMO

## 2020-05-08 DIAGNOSIS — M1990 Unspecified osteoarthritis, unspecified site: Secondary | ICD-10-CM | POA: Diagnosis present

## 2020-05-08 DIAGNOSIS — R7889 Finding of other specified substances, not normally found in blood: Secondary | ICD-10-CM | POA: Diagnosis not present

## 2020-05-08 DIAGNOSIS — Z79899 Other long term (current) drug therapy: Secondary | ICD-10-CM | POA: Diagnosis not present

## 2020-05-08 DIAGNOSIS — I10 Essential (primary) hypertension: Secondary | ICD-10-CM | POA: Diagnosis present

## 2020-05-08 DIAGNOSIS — E162 Hypoglycemia, unspecified: Secondary | ICD-10-CM | POA: Diagnosis present

## 2020-05-08 DIAGNOSIS — I340 Nonrheumatic mitral (valve) insufficiency: Secondary | ICD-10-CM | POA: Diagnosis not present

## 2020-05-08 DIAGNOSIS — R112 Nausea with vomiting, unspecified: Secondary | ICD-10-CM | POA: Diagnosis not present

## 2020-05-08 DIAGNOSIS — Z7982 Long term (current) use of aspirin: Secondary | ICD-10-CM

## 2020-05-08 DIAGNOSIS — N185 Chronic kidney disease, stage 5: Secondary | ICD-10-CM | POA: Diagnosis not present

## 2020-05-08 DIAGNOSIS — R7989 Other specified abnormal findings of blood chemistry: Secondary | ICD-10-CM

## 2020-05-08 DIAGNOSIS — K8309 Other cholangitis: Secondary | ICD-10-CM | POA: Diagnosis not present

## 2020-05-08 DIAGNOSIS — A4159 Other Gram-negative sepsis: Secondary | ICD-10-CM | POA: Diagnosis present

## 2020-05-08 DIAGNOSIS — E860 Dehydration: Secondary | ICD-10-CM | POA: Diagnosis present

## 2020-05-08 DIAGNOSIS — I5042 Chronic combined systolic (congestive) and diastolic (congestive) heart failure: Secondary | ICD-10-CM | POA: Diagnosis present

## 2020-05-08 DIAGNOSIS — K838 Other specified diseases of biliary tract: Secondary | ICD-10-CM | POA: Diagnosis not present

## 2020-05-08 DIAGNOSIS — R7881 Bacteremia: Secondary | ICD-10-CM | POA: Diagnosis not present

## 2020-05-08 DIAGNOSIS — E785 Hyperlipidemia, unspecified: Secondary | ICD-10-CM | POA: Diagnosis not present

## 2020-05-08 DIAGNOSIS — I361 Nonrheumatic tricuspid (valve) insufficiency: Secondary | ICD-10-CM | POA: Diagnosis not present

## 2020-05-08 DIAGNOSIS — Z79890 Hormone replacement therapy (postmenopausal): Secondary | ICD-10-CM

## 2020-05-08 DIAGNOSIS — I130 Hypertensive heart and chronic kidney disease with heart failure and stage 1 through stage 4 chronic kidney disease, or unspecified chronic kidney disease: Secondary | ICD-10-CM | POA: Diagnosis present

## 2020-05-08 DIAGNOSIS — E039 Hypothyroidism, unspecified: Secondary | ICD-10-CM | POA: Diagnosis present

## 2020-05-08 DIAGNOSIS — N184 Chronic kidney disease, stage 4 (severe): Secondary | ICD-10-CM

## 2020-05-08 DIAGNOSIS — Z20822 Contact with and (suspected) exposure to covid-19: Secondary | ICD-10-CM | POA: Diagnosis not present

## 2020-05-08 DIAGNOSIS — R1011 Right upper quadrant pain: Secondary | ICD-10-CM

## 2020-05-08 DIAGNOSIS — R109 Unspecified abdominal pain: Secondary | ICD-10-CM

## 2020-05-08 DIAGNOSIS — R509 Fever, unspecified: Secondary | ICD-10-CM | POA: Diagnosis not present

## 2020-05-08 DIAGNOSIS — R935 Abnormal findings on diagnostic imaging of other abdominal regions, including retroperitoneum: Secondary | ICD-10-CM | POA: Diagnosis not present

## 2020-05-08 DIAGNOSIS — A419 Sepsis, unspecified organism: Secondary | ICD-10-CM | POA: Diagnosis present

## 2020-05-08 DIAGNOSIS — R1010 Upper abdominal pain, unspecified: Secondary | ICD-10-CM | POA: Diagnosis not present

## 2020-05-08 DIAGNOSIS — N189 Chronic kidney disease, unspecified: Secondary | ICD-10-CM | POA: Diagnosis present

## 2020-05-08 DIAGNOSIS — K828 Other specified diseases of gallbladder: Secondary | ICD-10-CM | POA: Diagnosis not present

## 2020-05-08 DIAGNOSIS — I132 Hypertensive heart and chronic kidney disease with heart failure and with stage 5 chronic kidney disease, or end stage renal disease: Secondary | ICD-10-CM | POA: Diagnosis not present

## 2020-05-08 DIAGNOSIS — R932 Abnormal findings on diagnostic imaging of liver and biliary tract: Secondary | ICD-10-CM | POA: Diagnosis not present

## 2020-05-08 DIAGNOSIS — E872 Acidosis: Secondary | ICD-10-CM | POA: Diagnosis present

## 2020-05-08 DIAGNOSIS — A4151 Sepsis due to Escherichia coli [E. coli]: Secondary | ICD-10-CM | POA: Diagnosis not present

## 2020-05-08 DIAGNOSIS — R945 Abnormal results of liver function studies: Secondary | ICD-10-CM | POA: Diagnosis not present

## 2020-05-08 DIAGNOSIS — K839 Disease of biliary tract, unspecified: Secondary | ICD-10-CM | POA: Diagnosis not present

## 2020-05-08 DIAGNOSIS — I255 Ischemic cardiomyopathy: Secondary | ICD-10-CM | POA: Diagnosis not present

## 2020-05-08 DIAGNOSIS — I5022 Chronic systolic (congestive) heart failure: Secondary | ICD-10-CM | POA: Diagnosis present

## 2020-05-08 DIAGNOSIS — D631 Anemia in chronic kidney disease: Secondary | ICD-10-CM | POA: Diagnosis not present

## 2020-05-08 DIAGNOSIS — R1084 Generalized abdominal pain: Secondary | ICD-10-CM | POA: Diagnosis not present

## 2020-05-08 DIAGNOSIS — N183 Chronic kidney disease, stage 3 unspecified: Secondary | ICD-10-CM | POA: Diagnosis not present

## 2020-05-08 DIAGNOSIS — R7401 Elevation of levels of liver transaminase levels: Secondary | ICD-10-CM | POA: Diagnosis present

## 2020-05-08 LAB — COMPREHENSIVE METABOLIC PANEL
ALT: 127 U/L — ABNORMAL HIGH (ref 0–44)
AST: 208 U/L — ABNORMAL HIGH (ref 15–41)
Albumin: 3.7 g/dL (ref 3.5–5.0)
Alkaline Phosphatase: 111 U/L (ref 38–126)
Anion gap: 16 — ABNORMAL HIGH (ref 5–15)
BUN: 73 mg/dL — ABNORMAL HIGH (ref 8–23)
CO2: 17 mmol/L — ABNORMAL LOW (ref 22–32)
Calcium: 8.9 mg/dL (ref 8.9–10.3)
Chloride: 103 mmol/L (ref 98–111)
Creatinine, Ser: 3.89 mg/dL — ABNORMAL HIGH (ref 0.44–1.00)
GFR calc Af Amer: 11 mL/min — ABNORMAL LOW (ref >60)
GFR calc non Af Amer: 10 mL/min — ABNORMAL LOW (ref >60)
Glucose, Bld: 78 mg/dL (ref 70–99)
Potassium: 4.8 mmol/L (ref 3.5–5.1)
Sodium: 136 mmol/L (ref 135–145)
Total Bilirubin: 1.7 mg/dL — ABNORMAL HIGH (ref 0.3–1.2)
Total Protein: 7 g/dL (ref 6.5–8.1)

## 2020-05-08 LAB — CBC WITH DIFFERENTIAL/PLATELET
Abs Immature Granulocytes: 0.09 10*3/uL — ABNORMAL HIGH (ref 0.00–0.07)
Basophils Absolute: 0 10*3/uL (ref 0.0–0.1)
Basophils Relative: 0 %
Eosinophils Absolute: 0 10*3/uL (ref 0.0–0.5)
Eosinophils Relative: 0 %
HCT: 32.6 % — ABNORMAL LOW (ref 36.0–46.0)
Hemoglobin: 10.5 g/dL — ABNORMAL LOW (ref 12.0–15.0)
Immature Granulocytes: 1 %
Lymphocytes Relative: 3 %
Lymphs Abs: 0.4 10*3/uL — ABNORMAL LOW (ref 0.7–4.0)
MCH: 33.4 pg (ref 26.0–34.0)
MCHC: 32.2 g/dL (ref 30.0–36.0)
MCV: 103.8 fL — ABNORMAL HIGH (ref 80.0–100.0)
Monocytes Absolute: 0.6 10*3/uL (ref 0.1–1.0)
Monocytes Relative: 4 %
Neutro Abs: 12.6 10*3/uL — ABNORMAL HIGH (ref 1.7–7.7)
Neutrophils Relative %: 92 %
Platelets: 265 10*3/uL (ref 150–400)
RBC: 3.14 MIL/uL — ABNORMAL LOW (ref 3.87–5.11)
RDW: 15.3 % (ref 11.5–15.5)
WBC: 13.7 10*3/uL — ABNORMAL HIGH (ref 4.0–10.5)
nRBC: 0 % (ref 0.0–0.2)

## 2020-05-08 LAB — URINE CULTURE: Culture: NO GROWTH

## 2020-05-08 LAB — BLOOD CULTURE ID PANEL (REFLEXED)
Acinetobacter baumannii: NOT DETECTED
Candida albicans: NOT DETECTED
Candida glabrata: NOT DETECTED
Candida krusei: NOT DETECTED
Candida parapsilosis: NOT DETECTED
Candida tropicalis: NOT DETECTED
Carbapenem resistance: NOT DETECTED
Enterobacter cloacae complex: NOT DETECTED
Enterobacteriaceae species: DETECTED — AB
Enterococcus species: NOT DETECTED
Escherichia coli: DETECTED — AB
Haemophilus influenzae: NOT DETECTED
Klebsiella oxytoca: NOT DETECTED
Klebsiella pneumoniae: DETECTED — AB
Listeria monocytogenes: NOT DETECTED
Neisseria meningitidis: NOT DETECTED
Proteus species: NOT DETECTED
Pseudomonas aeruginosa: NOT DETECTED
Serratia marcescens: NOT DETECTED
Staphylococcus aureus (BCID): NOT DETECTED
Staphylococcus species: NOT DETECTED
Streptococcus agalactiae: NOT DETECTED
Streptococcus pneumoniae: NOT DETECTED
Streptococcus pyogenes: NOT DETECTED
Streptococcus species: NOT DETECTED

## 2020-05-08 LAB — LIPASE, BLOOD: Lipase: 22 U/L (ref 11–51)

## 2020-05-08 LAB — LACTIC ACID, PLASMA: Lactic Acid, Venous: 1.9 mmol/L (ref 0.5–1.9)

## 2020-05-08 MED ORDER — FENTANYL CITRATE (PF) 100 MCG/2ML IJ SOLN: 12.5000 ug | INTRAMUSCULAR | Status: DC | PRN

## 2020-05-08 MED ORDER — METRONIDAZOLE IN NACL 5-0.79 MG/ML-% IV SOLN
500.0000 mg | Freq: Three times a day (TID) | INTRAVENOUS | Status: DC
  Administered 2020-05-09 – 2020-05-11 (×7): 500 mg via INTRAVENOUS
  Filled 2020-05-08 (×7): qty 100

## 2020-05-08 MED ORDER — LEVOTHYROXINE SODIUM 100 MCG/5ML IV SOLN
25.0000 ug | Freq: Every day | INTRAVENOUS | Status: DC
  Administered 2020-05-09: 25 ug via INTRAVENOUS
  Filled 2020-05-08 (×2): qty 5

## 2020-05-08 MED ORDER — SODIUM CHLORIDE 0.9 % IV SOLN: INTRAVENOUS | Status: DC

## 2020-05-08 MED ORDER — SODIUM CHLORIDE 0.9 % IV SOLN
2.0000 g | INTRAVENOUS | Status: DC
  Administered 2020-05-09 – 2020-05-10 (×2): 2 g via INTRAVENOUS
  Filled 2020-05-08 (×2): qty 2

## 2020-05-08 MED ORDER — METRONIDAZOLE IN NACL 5-0.79 MG/ML-% IV SOLN
500.0000 mg | INTRAVENOUS | Status: AC
  Administered 2020-05-08: 500 mg via INTRAVENOUS
  Filled 2020-05-08: qty 100

## 2020-05-08 MED ORDER — ONDANSETRON HCL 4 MG/2ML IJ SOLN
4.0000 mg | Freq: Four times a day (QID) | INTRAMUSCULAR | Status: DC | PRN
  Administered 2020-05-09: 4 mg via INTRAVENOUS
  Filled 2020-05-08: qty 2

## 2020-05-08 MED ORDER — ONDANSETRON HCL 8 MG PO TABS
8.0000 mg | ORAL_TABLET | Freq: Three times a day (TID) | ORAL | 0 refills | Status: DC | PRN
Start: 2020-05-08 — End: 2020-05-11

## 2020-05-08 MED ORDER — SODIUM CHLORIDE 0.9 % IV SOLN
2.0000 g | Freq: Once | INTRAVENOUS | Status: AC
  Administered 2020-05-08: 2 g via INTRAVENOUS
  Filled 2020-05-08: qty 20

## 2020-05-08 MED ORDER — HYDRALAZINE HCL 20 MG/ML IJ SOLN
10.0000 mg | Freq: Four times a day (QID) | INTRAMUSCULAR | Status: DC | PRN
  Administered 2020-05-09 – 2020-05-10 (×2): 10 mg via INTRAVENOUS
  Filled 2020-05-08 (×2): qty 1

## 2020-05-08 MED ORDER — HEPARIN SODIUM (PORCINE) 5000 UNIT/ML IJ SOLN
5000.0000 [IU] | Freq: Three times a day (TID) | INTRAMUSCULAR | Status: DC
  Administered 2020-05-08 – 2020-05-11 (×8): 5000 [IU] via SUBCUTANEOUS
  Filled 2020-05-08 (×8): qty 1

## 2020-05-08 MED ORDER — ONDANSETRON HCL 4 MG PO TABS: 4.0000 mg | ORAL_TABLET | Freq: Four times a day (QID) | ORAL | Status: DC | PRN

## 2020-05-08 NOTE — Discharge Instructions (Addendum)
Gradual advance diet and offer fluids frequently.  Help her when she gets up.  Give Tylenol every 4 hours for fever.  Return here or see your doctor for problems.

## 2020-05-08 NOTE — ED Notes (Signed)
Pt placed on purewick. RN aware.

## 2020-05-08 NOTE — ED Notes (Signed)
Pt transported to MRI 

## 2020-05-08 NOTE — ED Provider Notes (Signed)
[facility] DE[facility] Provider Note   CSN: [id number] Arrival date & time: 05/08/20  1044     History Chief Complaint  Patient presents with  . Abnormal Lab    Alicia Sellers is a 86 y.o. female.  The history is provided by the patient and medical records. No language interpreter was used.  Abdominal Pain Pain location:  Epigastric and RUQ Pain quality: aching   Pain radiates to:  Does not radiate Pain severity:  Moderate Onset quality:  Gradual Timing:  Constant Progression:  Unchanged Chronicity:  New Relieved by:  Eating Worsened by:  Palpation Ineffective treatments:  None tried Associated symptoms: chills, diarrhea, fatigue, fever, nausea and vomiting   Associated symptoms: no chest pain, no constipation, no cough, no dysuria and no shortness of breath   Risk factors: being elderly        Past Medical History:  Diagnosis Date  . Acute on chronic left systolic heart failure ([place]) 10/26/2019  . Anemia   . Anxiety   . Arthritis   . Chronic kidney disease   . Dyspnea   . Headache   . Hypertension   . Hypothyroidism     Patient Active Problem List   Diagnosis Date Noted  . Acute on chronic left systolic heart failure ([place]) 10/26/2019  . Metabolic acidosis, normal anion gap (NAG) 12/02/2018  . Dyspnea 12/01/2018  . HTN (hypertension) 12/01/2018  . CKD (chronic kidney disease), stage IV ([place]) 12/01/2018  . Hypothyroidism 12/01/2018  . Anxiety 12/01/2018  . Anemia due to chronic kidney disease 12/01/2018  . Arthritis 01/06/2012    Past Surgical History:  Procedure Laterality Date  . APPENDECTOMY    . BREAST BIOPSY Left 2015   benign  . EYE SURGERY    . TONSILLECTOMY       OB History   No obstetric history on file.     Family History  Problem Relation Age of Onset  . Breast cancer Neg Hx     Social History   Tobacco Use  . Smoking status: Never Smoker  . Smokeless tobacco: Never Used  Substance Use Topics  .  Alcohol use: Never  . Drug use: Never    Home Medications Prior to Admission medications   Medication Sig Start Date End Date Taking? Authorizing Provider  amLODipine (NORVASC) 5 MG tablet Take 0.5 tablets (2.5 mg total) by mouth daily. 10/30/19   [name], MD  aspirin 81 MG chewable tablet Chew 81 mg by mouth at bedtime.     [provider]  citalopram (CELEXA) 10 MG tablet Take 10 mg by mouth daily.    [provider]  furosemide (LASIX) 40 MG tablet Take 1 tablet (40 mg total) by mouth daily. Patient taking differently: Take 20 mg by mouth daily.  10/31/19   [name], MD  gemfibrozil (LOPID) 600 MG tablet Take 600 mg by mouth 2 (two) times daily before a meal.    [provider]  hydrALAZINE (APRESOLINE) 25 MG tablet Take 50 mg by mouth in the morning and at bedtime.     [provider]  levothyroxine (SYNTHROID, LEVOTHROID) 50 MCG tablet Take 50 mcg by mouth daily before breakfast.    [provider]  LORazepam (ATIVAN) 0.5 MG tablet Take 0.25 mg by mouth at bedtime.     [provider]  metoprolol succinate (TOPROL-XL) 25 MG 24 hr tablet Take 12.5 mg by mouth every evening.     [provider]  neomycin-polymyxin-hydrocortisone (CORTISPORIN) OTIC solution Apply 1 to 2 drops to toe BID after soaking Patient not taking: Reported on 05/07/2020 04/10/20   Wallene Huh, DPM  Omega-3 Fatty Acids (FISH OIL) 1000 MG CAPS Take 1 capsule (1,000 mg total) by mouth 2 (two) times daily with a meal. 12/05/18   Gherghe, Vella Redhead, MD  ondansetron (ZOFRAN) 8 MG tablet Take 1 tablet (8 mg total) by mouth every 8 (eight) hours as needed for nausea or vomiting. 05/08/20   Daleen Bo, MD  polyethylene glycol (MIRALAX / GLYCOLAX) 17 g packet Take 17 g by mouth daily. Patient not taking: Reported on 05/07/2020 10/31/19   [name], MD  Red Yeast Rice 600 MG TABS Take 600 tablets by mouth 2 (two) times daily.    [provider]    sodium bicarbonate 650 MG tablet Take 650 mg by mouth daily.  07/19/19   [provider]  vitamin E 400 UNIT capsule Take 400 Units by mouth daily.    [provider]    Allergies    Latex and Penicillins  Review of Systems   Review of Systems  Constitutional: Positive for chills, fatigue and fever. Negative for diaphoresis.  HENT: Negative for congestion.   Respiratory: Negative for cough, chest tightness, shortness of breath and wheezing.   Cardiovascular: Negative for chest pain, palpitations and leg swelling.  Gastrointestinal: Positive for abdominal pain, diarrhea, nausea and vomiting. Negative for constipation.  Genitourinary: Negative for dysuria, flank pain and frequency.  Musculoskeletal: Negative for back pain, neck pain and neck stiffness.  Skin: Negative for rash.  Neurological: Negative for dizziness, light-headedness and headaches.  Psychiatric/Behavioral: Negative for agitation and confusion.  All other systems reviewed and are negative.   Physical Exam Updated Vital Signs BP (!) 155/49 (BP Location: Left Arm)   Pulse 64   Temp 98.2 F (36.8 C) (Oral)   Resp 16   Ht 5\' 1"  (1.549 m)   Wt 49.9 kg   SpO2 97%   BMI 20.78 kg/m   Physical Exam Vitals and nursing note reviewed.  Constitutional:      General: She is not in acute distress.    Appearance: She is well-developed. She is not ill-appearing, toxic-appearing or diaphoretic.  HENT:     Head: Normocephalic and atraumatic.     Nose: Nose normal. No congestion or rhinorrhea.     Mouth/Throat:     Mouth: Mucous membranes are moist.     Pharynx: No oropharyngeal exudate or posterior oropharyngeal erythema.  Eyes:     Conjunctiva/sclera: Conjunctivae normal.     Pupils: Pupils are equal, round, and reactive to light.  Cardiovascular:     Rate and Rhythm: Normal rate and regular rhythm.     Heart sounds: No murmur.  Pulmonary:     Effort: Pulmonary effort is normal. No respiratory  distress.     Breath sounds: Normal breath sounds. No wheezing, rhonchi or rales.  Chest:     Chest wall: No tenderness.  Abdominal:     General: Abdomen is flat.     Palpations: Abdomen is soft.     Tenderness: There is abdominal tenderness. There is no right CVA tenderness, left CVA tenderness, guarding or rebound.  Musculoskeletal:        General: No tenderness.     Cervical back: Neck supple.     Right lower leg: No edema.     Left lower leg: No edema.  Skin:    General: Skin is warm  and dry.     Capillary Refill: Capillary refill takes less than 2 seconds.     Findings: No erythema or rash.  Neurological:     General: No focal deficit present.     Mental Status: She is alert.  Psychiatric:        Mood and Affect: Mood normal.     ED Results / Procedures / Treatments   Labs (all labs ordered are listed, but only abnormal results are displayed) Labs Reviewed  CBC WITH DIFFERENTIAL/PLATELET - Abnormal; Notable for the following components:      Result Value   WBC 13.7 (*)    RBC 3.14 (*)    Hemoglobin 10.5 (*)    HCT 32.6 (*)    MCV 103.8 (*)    Neutro Abs 12.6 (*)    Lymphs Abs 0.4 (*)    Abs Immature Granulocytes 0.09 (*)    All other components within normal limits  COMPREHENSIVE METABOLIC PANEL - Abnormal; Notable for the following components:   CO2 17 (*)    BUN 73 (*)    Creatinine, Ser 3.89 (*)    AST 208 (*)    ALT 127 (*)    Total Bilirubin 1.7 (*)    GFR calc non Af Amer 10 (*)    GFR calc Af Amer 11 (*)    Anion gap 16 (*)    All other components within normal limits  CULTURE, BLOOD (ROUTINE X 2)  CULTURE, BLOOD (ROUTINE X 2)  LIPASE, BLOOD  LACTIC ACID, PLASMA    EKG None  Radiology CT ABDOMEN PELVIS WO CONTRAST  Result Date: 05/07/2020 CLINICAL DATA:  Nausea, vomiting EXAM: CT ABDOMEN AND PELVIS WITHOUT CONTRAST TECHNIQUE: Multidetector CT imaging of the abdomen and pelvis was performed following the standard protocol without IV contrast.  COMPARISON:  Ultrasound 07/22/2018 FINDINGS: Lower chest: Bibasilar atelectasis or scarring. No acute abnormality. Hepatobiliary: Gallbladder is distended. No visible stones. Suspect mild intrahepatic biliary ductal dilatation. Common bile duct appears to be normal caliber. Pancreas: No focal abnormality or ductal dilatation. Spleen: No focal abnormality.  Normal size. Adrenals/Urinary Tract: Renovascular calcifications within the renal hila bilaterally. No urinary tract stones or hydronephrosis. Adrenal glands and urinary bladder unremarkable. Stomach/Bowel: Diffuse colonic diverticulosis. No active diverticulitis. Stomach and small bowel decompressed, unremarkable. Vascular/Lymphatic: Aortic atherosclerosis. No enlarged abdominal or pelvic lymph nodes. Reproductive: Uterus and adnexa unremarkable.  No mass. Other: No free fluid or free air. Musculoskeletal: No acute bony abnormality. IMPRESSION: Diffuse colonic diverticulosis. No inflammatory changes to suggest active diverticulitis. Distention of the gallbladder. There appears to be mild intrahepatic biliary ductal dilatation. No visible stones. This could be further evaluated with right upper quadrant ultrasound if felt clinically indicated. Aortic atherosclerosis. Electronically Signed   By: Rolm Baptise M.D.   On: 05/07/2020 18:34   DG Chest 2 View  Result Date: 05/08/2020 CLINICAL DATA:  Fever. EXAM: CHEST - 2 VIEW COMPARISON:  December 13, 2019. FINDINGS: Stable cardiomegaly. Atherosclerosis of thoracic aorta is noted. No pneumothorax or pleural effusion is noted. No acute pulmonary disease is noted. Bony thorax is unremarkable. IMPRESSION: No active cardiopulmonary disease. Aortic atherosclerosis. Electronically Signed   By: Marijo Conception M.D.   On: 05/08/2020 12:41   US Abdomen Complete  Result Date: 05/07/2020 CLINICAL DATA:  Upper abdominal pain for 2 days EXAM: ABDOMEN ULTRASOUND COMPLETE COMPARISON:  None. FINDINGS: Gallbladder: Layering  hyperdense sludge is seen. No sonographic Murphy sign noted by sonographer. No gallbladder wall thickening. Common bile duct:  Diameter: 4 mm Liver: No focal lesion identified. Within normal limits in parenchymal echogenicity. Portal vein is patent on color Doppler imaging with normal direction of blood flow towards the liver. IVC: No abnormality visualized. Pancreas: Visualized portion unremarkable. Spleen: Size and appearance within normal limits. Right Kidney: Length: 8.3 cm. There is increased echogenicity seen throughout the right kidney. Trace free fluid seen around the right kidney. Left Kidney: Length: 7.8 cm. Echogenicity within normal limits. There is a complex anechoic cyst seen within the lower pole measuring 1.4 x 1.3 x 1.3 cm. Abdominal aorta: Limited due to overlying bowel gas Other findings: None. IMPRESSION: Layering hyperdense sludge.  No evidence of acute cholecystitis. Electronically Signed   By: Prudencio Pair M.D.   On: 05/07/2020 23:05   US Abdomen Limited RUQ  Result Date: 05/08/2020 CLINICAL DATA:  Elevated LFTs, positive blood cultures, sludge seen on yesterday exam, continued RIGHT upper quadrant pain question acute cholecystitis EXAM: ULTRASOUND ABDOMEN LIMITED RIGHT UPPER QUADRANT COMPARISON:  05/07/2020 FINDINGS: Gallbladder: Significant sludge in gallbladder. No definite shadowing calculi. Minimal gallbladder wall thickening. No pericholecystic fluid or sonographic Murphy sign. Common bile duct: Diameter: 9 mm, increased from 4 mm yesterday Liver: Normal echogenicity without mass or nodularity. Mild central intrahepatic biliary dilatation new since previous exam. Portal vein is patent on color Doppler imaging with normal direction of blood flow towards the liver. Other: No RIGHT upper quadrant free fluid. IMPRESSION: Sludge within gallbladder with minimal gallbladder wall thickening but no definite calculi visualized. Interval increase in diameter of CBD now 9 mm (4 mm yesterday) with  associated mild intrahepatic biliary dilatation centrally; CBD obstruction not excluded and in the setting of elevated LFTs, consider MRCP imaging. Electronically Signed   By: Lavonia Dana M.D.   On: 05/08/2020 16:30    Procedures Procedures (including critical care time)  CRITICAL CARE Performed by: Gwenyth Allegra Alean Kromer Total critical care time: 35 minutes Critical care time was exclusive of separately billable procedures and treating other patients. Critical care was necessary to treat or prevent imminent or life-threatening deterioration. Critical care was time spent personally by me on the following activities: development of treatment plan with patient and/or surrogate as well as nursing, discussions with consultants, evaluation of patient's response to treatment, examination of patient, obtaining history from patient or surrogate, ordering and performing treatments and interventions, ordering and review of laboratory studies, ordering and review of radiographic studies, pulse oximetry and re-evaluation of patient's condition.   Medications Ordered in ED Medications  cefTRIAXone (ROCEPHIN) 2 g in sodium chloride 0.9 % 100 mL IVPB (0 g Intravenous Stopped 05/08/20 1600)    ED Course  I have reviewed the triage vital signs and the nursing notes.  Pertinent labs & imaging results that were available during my care of the patient were reviewed by me and considered in my medical decision making (see chart for details).    MDM Rules/Calculators/A&P                      Alicia Sellers is a 86 y.o. female with a past medical history significant for CKD, hypertension, hypothyroidism, anxiety, prior appendectomy, CHF, and recent evaluation in the emergency department yesterday for nausea, vomiting, diarrhea, abdominal pain who presents after being called back for positive blood cultures.  Patient was seen by me yesterday and I got her work-up started for abdominal tenderness/pain, nausea,  vomiting, diarrhea.  During her work-up, her CT scan revealed some biliary sludge and an ultrasound was  ordered.  The ultrasound confirmed the sludge but no evidence of acute cholecystitis at that time.  Patient reported feeling better and was able to eat and drink and was then able to go home.  Patient did have a fever overnight.  This morning, patient was called back for positive blood cultures and told to come back to emergency department.  Patient is reporting that she is feeling better from a nausea, vomiting, diarrhea standpoint as well as abdominal discomfort has improved.  She does report still feeling very tired and fatigued.  She denies any cough, congestion, chest pain, shortness of breath.  She denies any urinary symptoms still.  Her urinalysis yesterday did not show infection.  On arrival, rectal temperature was 99.5.  She still feels warm to the touch but is not febrile currently.  On exam, patient's lungs were clear and chest was nontender.  Abdomen was only slightly tender in the upper abdomen.  Normal bowel sounds.  Patient otherwise resting comfortably.  Clinically I am concerned about bacteremia given the patient's on and off fevers and her symptoms yesterday.  We will get repeat blood work and repeat cultures.  We did not get a chest x-ray yesterday so we will get a chest x-ray to look for occult infection.   I anticipate she will need IV antibiotics and admission for the bacteremia with fever after her work-up is completed.  2:34 PM Patient's work-up again returned.  She now has a leukostasis when she did not yesterday.  Her LFTs are elevated compared to yesterday as well.  Chest x-ray did not show pneumonia.  Lipase not elevated.  Will trend lactic acid and give her antibiotics for the bacteremia which I am concerned is from a biliary source.  We will get a repeat rectal quadrant ultrasound to look for development of acute cholecystitis.  She will need admission but we need to make  sure we do not go surgery first.  4:45 PM Repeat ultrasound does show more dilation of the common bile duct from 4 mm yesterday to 9 mm today.  There is also some minimal gallbladder wall thickening with continued sludge in the gallbladder.  Radiology recommended likely MRCP for further evaluation.  We will call gastroenterology to discuss management however anticipate she will need admission.  As there is not definite evidence of acute cholecystitis, will hold on calling surgery and will speak to GI.  I spoke with with our GI but as the patient is a PCP patient of [facility] will be called.\  Dr. Cristina Gong with [facility] requested MRCP which he help me order.  They will see the patient and will follow along.  They agree with medicine admission for further management.  Patient admitted for further management of abdominal pain with evidence of possible common bile duct blockage and rising liver function test with in the setting of bacteremia.    Final Clinical Impression(s) / ED Diagnoses Final diagnoses:  RUQ abdominal pain  Bacteremia  Common bile duct dilatation  LFT elevation    Clinical Impression: 1. Bacteremia   2. RUQ abdominal pain   3. Common bile duct dilatation   4. LFT elevation     Disposition: Admit  This note was prepared with assistance of Dragon voice recognition software. Occasional wrong-word or sound-a-like substitutions may have occurred due to the inherent limitations of voice recognition software.     Jeffre Enriques, Gwenyth Allegra, MD 05/08/20 [phone]

## 2020-05-08 NOTE — ED Triage Notes (Signed)
Patient was seen in the ED yesterday for emesis.  Patient states she was called this AM by the hospital lab and was told to come to the ED for abnormal l ab results.

## 2020-05-08 NOTE — ED Notes (Signed)
Pt attempted to ambulate in the hallway. Pt was unsteady on her feet and unable to walk to the door. Pt back in bed safely.

## 2020-05-08 NOTE — Telephone Encounter (Signed)
Called pt with pos blood culture , ecoli, Per Dr Ronnald Nian pt should return to hospital for additional treatment.  Per family pt is feeling some better this am, ambulatory w walker, but is pale and glassy eye,  Has not checked temp this am,  Will return to [facility] ED

## 2020-05-08 NOTE — H&P (Signed)
History and Physical        Hospital Admission Note Date: 05/08/2020  Patient name: Alicia Sellers Medical record number: [id number] Date of birth: Apr 12, 1929 Age: 86 y.o. Gender: female  PCP: Gaynelle Arabian, MD    Patient coming from: Home  I have reviewed all records in the [facility].    Chief Complaint:  Nausea vomiting and abdominal pain yesterday Called back to ED due to bacteremia today  HPI: Patient is a 86 year old female with history of chronic systolic CHF, EF 20 to 84%, grade 1 diastolic dysfunction (2D echo 10/2019, primary cardiologist, Dr. Doylene Canard), hypertension, hyperlipidemia, CKD stage IV, hypothyroidism initially presented to ED yesterday for nausea vomiting, abdominal pain.  History was obtained from the patient and her daughter-in-law in the room.  Patient reported that she was in her baseline state of health until the morning, when she started having nausea vomiting and upper abdominal pain.  She also reported few episodes of diarrhea.  No hematemesis, hematochezia or melena.  She described the abdominal pain as 9/10, intermittent and aching.  The pain worsened after she ate food.  Subsequently she started having chest pain and she got concerned and called 911.  No fevers or chills.,  No dysuria or hematuria. Work-up in the ED on 6/1 with CT abdomen showed no diverticulitis, distention of the gallbladder, no visible stones, mild transaminitis, neutropenia with white count of 1.3.  Creatinine close to her baseline 3.9. Patient subsequently felt better in ED, and was able to eat and drink water.  She felt that she could manage at home hence patient was discharged from ED. Patient was called at her home today for positive blood cultures + E. Coli.  Per patient, she was feeling somewhat better today, was ambulating with walker, was able to eat breakfast  today.  Denied any vomiting, fevers, chills or any worsening abdominal pain today today. No diarrhea today.   ED work-up/course:  Temp 99.5, respiratory rate 16, pulse 64, BP 155/49, O2 sats 97% on room air Sodium 136, potassium 4.8, CO2 17, anion gap 16, BUN 73, creatinine 3.89, lipase 22, AST 208 (was 58 yesterday), ALT 127 (was 31 yesterday), alk phos 111 Lactic acid 1.9 0.5, hematocrit 32.6, MCV 103.8, platelets 265   Review of Systems: Positives marked in 'bold' Constitutional: Denies fever, chills, diaphoresis, poor appetite and fatigue.  HEENT: Denies photophobia, eye pain, redness, hearing loss, ear pain, congestion, sore throat, rhinorrhea, sneezing, mouth sores, trouble swallowing, neck pain, neck stiffness and tinnitus.   Respiratory: Denies SOB, DOE, cough, and wheezing.  No chest pain today Cardiovascular: Denies chest pain, palpitations and leg swelling.  Gastrointestinal: Please see HPI Genitourinary: Denies dysuria, urgency, frequency, hematuria, flank pain and difficulty urinating.  Musculoskeletal: Denies myalgias, back pain, joint swelling, at baseline walks with a walker Skin: Denies pallor, rash and wound.  Neurological: Denies dizziness, seizures, syncope, weakness, light-headedness, numbness and headaches.  Hematological: Denies adenopathy. Easy bruising, personal or family bleeding history  Psychiatric/Behavioral: Denies suicidal ideation, mood changes, confusion, nervousness, sleep disturbance and agitation  Past Medical History: Past Medical History:  Diagnosis Date  . Acute on chronic left systolic heart failure ([place]) 10/26/2019  .  Anemia   . Anxiety   . Arthritis   . Chronic kidney disease   . Dyspnea   . Headache   . Hypertension   . Hypothyroidism     Past Surgical History:  Procedure Laterality Date  . APPENDECTOMY    . BREAST BIOPSY Left 2015   benign  . EYE SURGERY    . TONSILLECTOMY      Medications: Prior to Admission medications     Medication Sig Start Date End Date Taking? Authorizing Provider  amLODipine (NORVASC) 5 MG tablet Take 0.5 tablets (2.5 mg total) by mouth daily. 10/30/19  Yes [name], MD  aspirin 81 MG chewable tablet Chew 81 mg by mouth at bedtime.    Yes [provider]  citalopram (CELEXA) 10 MG tablet Take 10 mg by mouth daily.   Yes [provider]  furosemide (LASIX) 40 MG tablet Take 1 tablet (40 mg total) by mouth daily. Patient taking differently: Take 20 mg by mouth daily.  10/31/19  Yes [name], MD  gemfibrozil (LOPID) 600 MG tablet Take 600 mg by mouth 2 (two) times daily before a meal.   Yes [provider]  hydrALAZINE (APRESOLINE) 25 MG tablet Take 50 mg by mouth in the morning and at bedtime.    Yes [provider]  levothyroxine (SYNTHROID, LEVOTHROID) 50 MCG tablet Take 50 mcg by mouth daily before breakfast.   Yes [provider]  LORazepam (ATIVAN) 0.5 MG tablet Take 0.25 mg by mouth at bedtime.    Yes [provider]  metoprolol succinate (TOPROL-XL) 25 MG 24 hr tablet Take 12.5 mg by mouth every evening.    Yes [provider]  Omega-3 Fatty Acids (FISH OIL) 1000 MG CAPS Take 1 capsule (1,000 mg total) by mouth 2 (two) times daily with a meal. 12/05/18  Yes Gherghe, Vella Redhead, MD  ondansetron (ZOFRAN) 8 MG tablet Take 1 tablet (8 mg total) by mouth every 8 (eight) hours as needed for nausea or vomiting. 05/08/20  Yes Daleen Bo, MD  Red Yeast Rice 600 MG TABS Take 600 tablets by mouth 2 (two) times daily.   Yes [provider]  sodium bicarbonate 650 MG tablet Take 650 mg by mouth daily.  07/19/19  Yes [provider]  vitamin E 400 UNIT capsule Take 400 Units by mouth daily.   Yes [provider]  neomycin-polymyxin-hydrocortisone (CORTISPORIN) OTIC solution Apply 1 to 2 drops to toe BID after soaking Patient not taking: Reported on 05/07/2020 04/10/20   Wallene Huh, DPM  polyethylene  glycol (MIRALAX / GLYCOLAX) 17 g packet Take 17 g by mouth daily. Patient not taking: Reported on 05/07/2020 10/31/19   [name], MD    Allergies:   Allergies  Allergen Reactions  . Latex Rash  . Penicillins Rash    DID THE REACTION INVOLVE: Swelling of the face/tongue/throat, SOB, or low BP? No Sudden or severe rash/hives, skin peeling, or the inside of the mouth or nose? No Did it require medical treatment? No When did it last happen? If all above answers are "NO", may proceed with cephalosporin use.    Social History:  reports that she has never smoked. She has never used smokeless tobacco. She reports that she does not drink alcohol or use drugs.  Family History: Family History  Problem Relation Age of Onset  . Breast cancer Neg Hx     Physical Exam: Blood pressure (!) 172/59, pulse 64, temperature 99.5 F (37.5 C),  temperature source Rectal, resp. rate 19, height '5\' 1"'$  (1.549 m), weight 49.9 kg, SpO2 97 %. General: Alert, awake, oriented x3, in no acute distress.  Dry mucosal membranes Eyes: pink conjunctiva,anicteric sclera, pupils equal and reactive to light and accomodation, HEENT: normocephalic, atraumatic, oropharynx clear Neck: supple, no masses or lymphadenopathy, no goiter, no bruits, no JVD CVS: Regular rate and rhythm, without murmurs, rubs or gallops. No lower extremity edema Resp : Clear to auscultation bilaterally, no wheezing, rales or rhonchi. GI : Soft, right upper quadrant abdominal tenderness, nondistended, positive bowel sounds, no masses. No hepatomegaly. No hernia.  Musculoskeletal: No clubbing or cyanosis, positive pedal pulses. No contracture. ROM intact  Neuro: Grossly intact, no focal neurological deficits, strength 5/5 upper and lower extremities bilaterally Psych: alert and oriented x 3, normal mood and affect Skin: no rashes or lesions, warm and dry   LABS on Admission: I have personally reviewed all the labs and imagings below     Basic Metabolic Panel: Recent Labs  Lab 05/07/20 1417 05/08/20 1231  NA 140 136  K 4.7 4.8  CL 106 103  CO2 20* 17*  GLUCOSE 108* 78  BUN 73* 73*  CREATININE 3.90* 3.89*  CALCIUM 9.5 8.9   Liver Function Tests: Recent Labs  Lab 05/07/20 1417 05/08/20 1231  AST 58* 208*  ALT 31 127*  ALKPHOS 95 111  BILITOT 1.9* 1.7*  PROT 6.9 7.0  ALBUMIN 3.7 3.7   Recent Labs  Lab 05/07/20 1417 05/08/20 1231  LIPASE 64* 22   No results for input(s): AMMONIA in the last 168 hours. CBC: Recent Labs  Lab 05/07/20 1417 05/07/20 1417 05/08/20 1231  WBC 1.3*  --  13.7*  NEUTROABS 1.1*   < > 12.6*  HGB 11.2*  --  10.5*  HCT 35.5*  --  32.6*  MCV 104.4*   < > 103.8*  PLT 284  --  265   < > = values in this interval not displayed.   Cardiac Enzymes: No results for input(s): CKTOTAL, CKMB, CKMBINDEX, TROPONINI in the last 168 hours. BNP: Invalid input(s): POCBNP CBG: No results for input(s): GLUCAP in the last 168 hours.  Radiological Exams on Admission:  CT ABDOMEN PELVIS WO CONTRAST  Result Date: 05/07/2020 CLINICAL DATA:  Nausea, vomiting EXAM: CT ABDOMEN AND PELVIS WITHOUT CONTRAST TECHNIQUE: Multidetector CT imaging of the abdomen and pelvis was performed following the standard protocol without IV contrast. COMPARISON:  Ultrasound 07/22/2018 FINDINGS: Lower chest: Bibasilar atelectasis or scarring. No acute abnormality. Hepatobiliary: Gallbladder is distended. No visible stones. Suspect mild intrahepatic biliary ductal dilatation. Common bile duct appears to be normal caliber. Pancreas: No focal abnormality or ductal dilatation. Spleen: No focal abnormality.  Normal size. Adrenals/Urinary Tract: Renovascular calcifications within the renal hila bilaterally. No urinary tract stones or hydronephrosis. Adrenal glands and urinary bladder unremarkable. Stomach/Bowel: Diffuse colonic diverticulosis. No active diverticulitis. Stomach and small bowel decompressed, unremarkable.  Vascular/Lymphatic: Aortic atherosclerosis. No enlarged abdominal or pelvic lymph nodes. Reproductive: Uterus and adnexa unremarkable.  No mass. Other: No free fluid or free air. Musculoskeletal: No acute bony abnormality. IMPRESSION: Diffuse colonic diverticulosis. No inflammatory changes to suggest active diverticulitis. Distention of the gallbladder. There appears to be mild intrahepatic biliary ductal dilatation. No visible stones. This could be further evaluated with right upper quadrant ultrasound if felt clinically indicated. Aortic atherosclerosis. Electronically Signed   By: Rolm Baptise M.D.   On: 05/07/2020 18:34   DG Chest 2 View  Result Date: 05/08/2020 CLINICAL DATA:  Fever. EXAM: CHEST - 2 VIEW COMPARISON:  December 13, 2019. FINDINGS: Stable cardiomegaly. Atherosclerosis of thoracic aorta is noted. No pneumothorax or pleural effusion is noted. No acute pulmonary disease is noted. Bony thorax is unremarkable. IMPRESSION: No active cardiopulmonary disease. Aortic atherosclerosis. Electronically Signed   By: Marijo Conception M.D.   On: 05/08/2020 12:41   US Abdomen Complete  Result Date: 05/07/2020 CLINICAL DATA:  Upper abdominal pain for 2 days EXAM: ABDOMEN ULTRASOUND COMPLETE COMPARISON:  None. FINDINGS: Gallbladder: Layering hyperdense sludge is seen. No sonographic Murphy sign noted by sonographer. No gallbladder wall thickening. Common bile duct: Diameter: 4 mm Liver: No focal lesion identified. Within normal limits in parenchymal echogenicity. Portal vein is patent on color Doppler imaging with normal direction of blood flow towards the liver. IVC: No abnormality visualized. Pancreas: Visualized portion unremarkable. Spleen: Size and appearance within normal limits. Right Kidney: Length: 8.3 cm. There is increased echogenicity seen throughout the right kidney. Trace free fluid seen around the right kidney. Left Kidney: Length: 7.8 cm. Echogenicity within normal limits. There is a complex  anechoic cyst seen within the lower pole measuring 1.4 x 1.3 x 1.3 cm. Abdominal aorta: Limited due to overlying bowel gas Other findings: None. IMPRESSION: Layering hyperdense sludge.  No evidence of acute cholecystitis. Electronically Signed   By: Prudencio Pair M.D.   On: 05/07/2020 23:05   US Abdomen Limited RUQ  Result Date: 05/08/2020 CLINICAL DATA:  Elevated LFTs, positive blood cultures, sludge seen on yesterday exam, continued RIGHT upper quadrant pain question acute cholecystitis EXAM: ULTRASOUND ABDOMEN LIMITED RIGHT UPPER QUADRANT COMPARISON:  05/07/2020 FINDINGS: Gallbladder: Significant sludge in gallbladder. No definite shadowing calculi. Minimal gallbladder wall thickening. No pericholecystic fluid or sonographic Murphy sign. Common bile duct: Diameter: 9 mm, increased from 4 mm yesterday Liver: Normal echogenicity without mass or nodularity. Mild central intrahepatic biliary dilatation new since previous exam. Portal vein is patent on color Doppler imaging with normal direction of blood flow towards the liver. Other: No RIGHT upper quadrant free fluid. IMPRESSION: Sludge within gallbladder with minimal gallbladder wall thickening but no definite calculi visualized. Interval increase in diameter of CBD now 9 mm (4 mm yesterday) with associated mild intrahepatic biliary dilatation centrally; CBD obstruction not excluded and in the setting of elevated LFTs, consider MRCP imaging. Electronically Signed   By: Lavonia Dana M.D.   On: 05/08/2020 16:30      EKG: Independently reviewed.  No recent EKG   Assessment/Plan Principal Problem:   Sepsis due to Escherichia coli (E. coli) ([place]), Klebsiella, septic cholangitis, transaminitis -Likely due to GI source, most likely due to septic cholangitis.  No acute cholecystitis on ultrasound or CT.  Ultrasound abdomen showed sludge within the gallbladder with minimal gallbladder wall thickening, no definitive calculi, interval increase in CBD diameter now  9 mm (4 mm yesterday) -Continue n.p.o. status, gentle IV fluid hydration (EF of 20 to 25%, avoid volume overload), IV Rocephin per pharmacy with addition of Flagyl to cover the GI source, repeat blood cultures -GI consulted, discussed with Dr. Cristina Gong, recommended MRCP.  Pending MRCP results, will require ERCP +/-cholecystectomy.   Active Problems:   HTN (hypertension) -Currently n.p.o. status, will place on IV hydralazine as needed with parameters    Hypothyroidism -Continue Synthroid 25 MCG IV daily, while n.p.o.  -TSH in a.m.    Anemia due to chronic kidney disease -Currently at baseline    Chronic combined systolic and diastolic CHF (congestive heart failure) ([place]) -2D echo 10/2019 had  shown EF of 20 to 25% with grade 1 diastolic dysfunction -Hold Lasix, currently n.p.o. status, appears to be volume depleted due to vomiting and diarrhea yesterday -Placed on gentle hydration, monitor for any volume overload  CKD stage IV, AG metabolic acidosis -Creatinine currently at baseline, gentle hydration   DVT prophylaxis: Heparin subcu  CODE STATUS: Full CODE STATUS  Consults called: Gastroenterology  Family Communication: Admission, patients condition and plan of care including tests being ordered have been discussed with the patient and daughter-in-law who indicates understanding and agree with the plan and Code Status  Admission status: Inpatient, stepdown  The medical decision making on this patient was of high complexity and the patient is at high risk for clinical deterioration, therefore this is a level 3 admission.  Severity of Illness:      The appropriate patient status for this patient is INPATIENT. Inpatient status is judged to be reasonable and necessary in order to provide the required intensity of service to ensure the patient's safety. The patient's presenting symptoms, physical exam findings, and initial radiographic and laboratory data in the context of their chronic  comorbidities is felt to place them at high risk for further clinical deterioration. Furthermore, it is not anticipated that the patient will be medically stable for discharge from the hospital within 2 midnights of admission. The following factors support the patient status of inpatient.   " The patient's presenting symptoms include nausea vomiting abdominal pain diarrhea. " The worrisome physical exam findings include abdominal tenderness " The initial radiographic and laboratory data are worrisome because of sepsis, cholangitis " The chronic co-morbidities include CHF, EF 20 to 25%, hypertension, hyperlipidemia, hypothyroidism   * I certify that at the point of admission it is my clinical judgment that the patient will require inpatient hospital care spanning beyond 2 midnights from the point of admission due to high intensity of service, high risk for further deterioration and high frequency of surveillance required.*    Time Spent on Admission: 70 minutes     Damya Comley M.D. [facility] 05/08/2020, 5:55 PM

## 2020-05-08 NOTE — ED Notes (Signed)
ED TO INPATIENT HANDOFF REPORT  Name/Age/Gender Alicia Sellers 86 y.o. female  Code Status    Code Status Orders  (From admission, onward)         Start     Ordered   05/08/20 2217  Full code  Continuous     05/08/20 2216        Code Status History    Date Active Date Inactive Code Status Order ID Comments User Context   10/26/2019 1854 10/30/2019 2116 Full Code [id number]  [name], MD Inpatient   12/01/2018 1450 12/05/2018 1600 Full Code [id number]  Desiree Hane, MD Inpatient   Advance Care Planning Activity    Advance Directive Documentation     Most Recent Value  Type of Advance Directive  Healthcare Power of Attorney, Living will  Pre-existing out of facility DNR order (yellow form or pink MOST form)  -  "MOST" Form in Place?  -      Home/SNF/Other Home  Chief Complaint Cholangitis [K83.09] Sepsis ([place]) [A41.9]  Level of Care/Admitting Diagnosis ED Disposition    ED Disposition Condition [place]: [facility] [100102]  Level of Care: Telemetry [5]  Admit to tele based on following criteria: Monitor for Ischemic changes  Covid Evaluation: Asymptomatic Screening Protocol (No Symptoms)  Diagnosis: Sepsis [facility]) [[id number]]  Admitting Physician: Rise Patience [phone]  Attending Physician: Rise Patience [phone]  Estimated length of stay: past midnight tomorrow  Certification:: I certify this patient will need inpatient services for at least 2 midnights       Medical History Past Medical History:  Diagnosis Date  . Acute on chronic left systolic heart failure ([employer]) 10/26/2019  . Anemia   . Anxiety   . Arthritis   . Chronic kidney disease   . Dyspnea   . Headache   . Hypertension   . Hypothyroidism     Allergies Allergies  Allergen Reactions  . Latex Rash  . Penicillins Rash    DID THE REACTION INVOLVE: Swelling of the face/tongue/throat, SOB, or low BP? No Sudden or severe  rash/hives, skin peeling, or the inside of the mouth or nose? No Did it require medical treatment? No When did it last happen? If all above answers are "NO", may proceed with cephalosporin use.    IV Location/Drains/Wounds Patient Lines/Drains/Airways Status   Active Line/Drains/Airways    Name:   Placement date:   Placement time:   Site:   Days:   Peripheral IV 05/08/20 Left Antecubital   05/08/20    1222    Antecubital   less than 1   Peripheral IV 05/08/20 Right;Upper Forearm   05/08/20    1227    Forearm   less than 1          Labs/Imaging Results for orders placed or performed during the hospital encounter of 05/08/20 (from the past 48 hour(s))  CBC with Differential     Status: Abnormal   Collection Time: 05/08/20 12:31 PM  Result Value Ref Range   WBC 13.7 (H) 4.0 - 10.5 K/uL   RBC 3.14 (L) 3.87 - 5.11 MIL/uL   Hemoglobin 10.5 (L) 12.0 - 15.0 g/dL   HCT 32.6 (L) 36.0 - 46.0 %   MCV 103.8 (H) 80.0 - 100.0 fL   MCH 33.4 26.0 - 34.0 pg   MCHC 32.2 30.0 - 36.0 g/dL   RDW 15.3 11.5 - 15.5 %   Platelets 265 150 - 400 K/uL  nRBC 0.0 0.0 - 0.2 %   Neutrophils Relative % 92 %   Neutro Abs 12.6 (H) 1.7 - 7.7 K/uL   Lymphocytes Relative 3 %   Lymphs Abs 0.4 (L) 0.7 - 4.0 K/uL   Monocytes Relative 4 %   Monocytes Absolute 0.6 0.1 - 1.0 K/uL   Eosinophils Relative 0 %   Eosinophils Absolute 0.0 0.0 - 0.5 K/uL   Basophils Relative 0 %   Basophils Absolute 0.0 0.0 - 0.1 K/uL   WBC Morphology TOXIC GRANULATION     Comment: VACUOLATED NEUTROPHILS   Immature Granulocytes 1 %   Abs Immature Granulocytes 0.09 (H) 0.00 - 0.07 K/uL   Acanthocytes PRESENT    Schistocytes PRESENT    Polychromasia PRESENT     Comment: Performed at [facility], [place] [address] [place], [place] [address]  Comprehensive metabolic panel     Status: Abnormal   Collection Time: 05/08/20 12:31 PM  Result Value Ref Range   Sodium 136 135 - 145 mmol/L   Potassium 4.8 3.5 - 5.1  mmol/L   Chloride 103 98 - 111 mmol/L   CO2 17 (L) 22 - 32 mmol/L   Glucose, Bld 78 70 - 99 mg/dL    Comment: Glucose reference range applies only to samples taken after fasting for at least 8 hours.   BUN 73 (H) 8 - 23 mg/dL   Creatinine, Ser 3.89 (H) 0.44 - 1.00 mg/dL   Calcium 8.9 8.9 - 10.3 mg/dL   Total Protein 7.0 6.5 - 8.1 g/dL   Albumin 3.7 3.5 - 5.0 g/dL   AST 208 (H) 15 - 41 U/L   ALT 127 (H) 0 - 44 U/L   Alkaline Phosphatase 111 38 - 126 U/L   Total Bilirubin 1.7 (H) 0.3 - 1.2 mg/dL   GFR calc non Af Amer 10 (L) >60 mL/min   GFR calc Af Amer 11 (L) >60 mL/min   Anion gap 16 (H) 5 - 15    Comment: Performed at [facility], [place] [address] [place], [place] [address]  Lipase, blood     Status: None   Collection Time: 05/08/20 12:31 PM  Result Value Ref Range   Lipase 22 11 - 51 U/L    Comment: Performed at [facility], [place] [address], [place], [place] [address]  Lactic acid, plasma     Status: None   Collection Time: 05/08/20 12:31 PM  Result Value Ref Range   Lactic Acid, Venous 1.9 0.5 - 1.9 mmol/L    Comment: Performed at [facility], [place] [address], [place], [place] [address]   CT ABDOMEN PELVIS WO CONTRAST  Result Date: 05/07/2020 CLINICAL DATA:  Nausea, vomiting EXAM: CT ABDOMEN AND PELVIS WITHOUT CONTRAST TECHNIQUE: Multidetector CT imaging of the abdomen and pelvis was performed following the standard protocol without IV contrast. COMPARISON:  Ultrasound 07/22/2018 FINDINGS: Lower chest: Bibasilar atelectasis or scarring. No acute abnormality. Hepatobiliary: Gallbladder is distended. No visible stones. Suspect mild intrahepatic biliary ductal dilatation. Common bile duct appears to be normal caliber. Pancreas: No focal abnormality or ductal dilatation. Spleen: No focal abnormality.  Normal size. Adrenals/Urinary Tract: Renovascular calcifications within the renal hila bilaterally. No urinary tract stones or  hydronephrosis. Adrenal glands and urinary bladder unremarkable. Stomach/Bowel: Diffuse colonic diverticulosis. No active diverticulitis. Stomach and small bowel decompressed, unremarkable. Vascular/Lymphatic: Aortic atherosclerosis. No enlarged abdominal or pelvic lymph nodes. Reproductive: Uterus and adnexa unremarkable.  No mass. Other: No free fluid  or free air. Musculoskeletal: No acute bony abnormality. IMPRESSION: Diffuse colonic diverticulosis. No inflammatory changes to suggest active diverticulitis. Distention of the gallbladder. There appears to be mild intrahepatic biliary ductal dilatation. No visible stones. This could be further evaluated with right upper quadrant ultrasound if felt clinically indicated. Aortic atherosclerosis. Electronically Signed   By: Rolm Baptise M.D.   On: 05/07/2020 18:34   DG Chest 2 View  Result Date: 05/08/2020 CLINICAL DATA:  Fever. EXAM: CHEST - 2 VIEW COMPARISON:  December 13, 2019. FINDINGS: Stable cardiomegaly. Atherosclerosis of thoracic aorta is noted. No pneumothorax or pleural effusion is noted. No acute pulmonary disease is noted. Bony thorax is unremarkable. IMPRESSION: No active cardiopulmonary disease. Aortic atherosclerosis. Electronically Signed   By: Marijo Conception M.D.   On: 05/08/2020 12:41   US Abdomen Complete  Result Date: 05/07/2020 CLINICAL DATA:  Upper abdominal pain for 2 days EXAM: ABDOMEN ULTRASOUND COMPLETE COMPARISON:  None. FINDINGS: Gallbladder: Layering hyperdense sludge is seen. No sonographic Murphy sign noted by sonographer. No gallbladder wall thickening. Common bile duct: Diameter: 4 mm Liver: No focal lesion identified. Within normal limits in parenchymal echogenicity. Portal vein is patent on color Doppler imaging with normal direction of blood flow towards the liver. IVC: No abnormality visualized. Pancreas: Visualized portion unremarkable. Spleen: Size and appearance within normal limits. Right Kidney: Length: 8.3 cm. There is  increased echogenicity seen throughout the right kidney. Trace free fluid seen around the right kidney. Left Kidney: Length: 7.8 cm. Echogenicity within normal limits. There is a complex anechoic cyst seen within the lower pole measuring 1.4 x 1.3 x 1.3 cm. Abdominal aorta: Limited due to overlying bowel gas Other findings: None. IMPRESSION: Layering hyperdense sludge.  No evidence of acute cholecystitis. Electronically Signed   By: Prudencio Pair M.D.   On: 05/07/2020 23:05   MR ABDOMEN MRCP WO CONTRAST  Result Date: 05/08/2020 CLINICAL DATA:  86 year old female with history of acute nonlocalized abdominal pain. Emesis. EXAM: MRI ABDOMEN WITHOUT CONTRAST  (INCLUDING MRCP) TECHNIQUE: Multiplanar multisequence MR imaging of the abdomen was performed. Heavily T2-weighted images of the biliary and pancreatic ducts were obtained, and three-dimensional MRCP images were rendered by post processing. COMPARISON:  No prior abdominal MRI. Abdominal ultrasound 05/08/2020. FINDINGS: Comment: Today's study is limited for detection and characterization of visceral and/or vascular lesions by lack of IV gadolinium. Lower chest: Trace left pleural effusion. Hepatobiliary: Mild heterogeneous loss of signal intensity throughout the hepatic parenchyma on in phase dual echo images, suggesting heterogeneous hepatic iron deposition. This correlates to diffuse T2 hypointensity throughout the hepatic parenchyma. No discrete cystic or solid hepatic lesions are confidently identified on today's noncontrast examination. MRCP images demonstrate mild dilatation of the common bile duct which measures up to 9 mm in the porta hepatis, which could be age related. Notably, in the common hepatic duct there is a focal contour abnormality best appreciated on MRCP image 30 of series 12, which is poorly evaluated on today's noncontrast examination, but favored to be related to extrinsic compression from an adjacent vascular structure such as the common  hepatic artery. No filling defect otherwise identified within the lumen of the common bile duct to suggest choledocholithiasis. There is some amorphous dependent material in the gallbladder suggesting biliary sludge. Gallbladder is moderately distended. No gallbladder wall thickening or pericholecystic fluid. Pancreas: No pancreatic mass or peripancreatic fluid collections or inflammatory changes noted on today's noncontrast examination. MRCP images demonstrate no pancreatic ductal dilatation. Spleen:  Unremarkable. Adrenals/Urinary Tract: In the  lower pole of the left kidney there is a 1.3 cm T1 hypointense and T2 hyperintense lesion which is incompletely characterized on today's non-contrast examination, but favored to represent a cyst. Right kidney and bilateral adrenal glands are unremarkable in appearance. No hydroureteronephrosis in the visualized portions of the abdomen. Stomach/Bowel: Visualized portions are unremarkable. Vascular/Lymphatic: Aortic atherosclerosis, without evidence of aneurysm in the abdominal vasculature. Other: No significant volume of ascites noted in the visualized portions of the peritoneal cavity. Musculoskeletal: No aggressive appearing osseous lesions are noted in the visualized portions of the skeleton. IMPRESSION: 1. Evidence of biliary sludge in the gallbladder, without evidence of acute cholecystitis. 2. Common bile duct is mildly dilated measuring 9 mm in the porta hepatis, however, this is likely within normal limits given the patient's advanced age. No choledocholithiasis. Additionally, there is no evidence of intrahepatic biliary ductal dilatation to suggest significant biliary tract obstruction. 3. Findings are suggestive of heterogeneous iron deposition in the liver, as discussed above. 4. Eccentric filling defect in the common hepatic duct which is favored to be related to extrinsic compression from an adjacent vascular structure, but is poorly evaluated on today's  noncontrast examination. This could be more definitively evaluated with repeat abdominal MRI with and without IV gadolinium with MRCP if of clinical concern. 5. Aortic atherosclerosis. 6. Additional incidental findings, as above. Electronically Signed   By: Vinnie Langton M.D.   On: 05/08/2020 20:44   MR 3D Recon At Scanner  Result Date: 05/08/2020 CLINICAL DATA:  86 year old female with history of acute nonlocalized abdominal pain. Emesis. EXAM: MRI ABDOMEN WITHOUT CONTRAST  (INCLUDING MRCP) TECHNIQUE: Multiplanar multisequence MR imaging of the abdomen was performed. Heavily T2-weighted images of the biliary and pancreatic ducts were obtained, and three-dimensional MRCP images were rendered by post processing. COMPARISON:  No prior abdominal MRI. Abdominal ultrasound 05/08/2020. FINDINGS: Comment: Today's study is limited for detection and characterization of visceral and/or vascular lesions by lack of IV gadolinium. Lower chest: Trace left pleural effusion. Hepatobiliary: Mild heterogeneous loss of signal intensity throughout the hepatic parenchyma on in phase dual echo images, suggesting heterogeneous hepatic iron deposition. This correlates to diffuse T2 hypointensity throughout the hepatic parenchyma. No discrete cystic or solid hepatic lesions are confidently identified on today's noncontrast examination. MRCP images demonstrate mild dilatation of the common bile duct which measures up to 9 mm in the porta hepatis, which could be age related. Notably, in the common hepatic duct there is a focal contour abnormality best appreciated on MRCP image 30 of series 12, which is poorly evaluated on today's noncontrast examination, but favored to be related to extrinsic compression from an adjacent vascular structure such as the common hepatic artery. No filling defect otherwise identified within the lumen of the common bile duct to suggest choledocholithiasis. There is some amorphous dependent material in the  gallbladder suggesting biliary sludge. Gallbladder is moderately distended. No gallbladder wall thickening or pericholecystic fluid. Pancreas: No pancreatic mass or peripancreatic fluid collections or inflammatory changes noted on today's noncontrast examination. MRCP images demonstrate no pancreatic ductal dilatation. Spleen:  Unremarkable. Adrenals/Urinary Tract: In the lower pole of the left kidney there is a 1.3 cm T1 hypointense and T2 hyperintense lesion which is incompletely characterized on today's non-contrast examination, but favored to represent a cyst. Right kidney and bilateral adrenal glands are unremarkable in appearance. No hydroureteronephrosis in the visualized portions of the abdomen. Stomach/Bowel: Visualized portions are unremarkable. Vascular/Lymphatic: Aortic atherosclerosis, without evidence of aneurysm in the abdominal vasculature. Other: No significant volume  of ascites noted in the visualized portions of the peritoneal cavity. Musculoskeletal: No aggressive appearing osseous lesions are noted in the visualized portions of the skeleton. IMPRESSION: 1. Evidence of biliary sludge in the gallbladder, without evidence of acute cholecystitis. 2. Common bile duct is mildly dilated measuring 9 mm in the porta hepatis, however, this is likely within normal limits given the patient's advanced age. No choledocholithiasis. Additionally, there is no evidence of intrahepatic biliary ductal dilatation to suggest significant biliary tract obstruction. 3. Findings are suggestive of heterogeneous iron deposition in the liver, as discussed above. 4. Eccentric filling defect in the common hepatic duct which is favored to be related to extrinsic compression from an adjacent vascular structure, but is poorly evaluated on today's noncontrast examination. This could be more definitively evaluated with repeat abdominal MRI with and without IV gadolinium with MRCP if of clinical concern. 5. Aortic atherosclerosis.  6. Additional incidental findings, as above. Electronically Signed   By: Vinnie Langton M.D.   On: 05/08/2020 20:44   US Abdomen Limited RUQ  Result Date: 05/08/2020 CLINICAL DATA:  Elevated LFTs, positive blood cultures, sludge seen on yesterday exam, continued RIGHT upper quadrant pain question acute cholecystitis EXAM: ULTRASOUND ABDOMEN LIMITED RIGHT UPPER QUADRANT COMPARISON:  05/07/2020 FINDINGS: Gallbladder: Significant sludge in gallbladder. No definite shadowing calculi. Minimal gallbladder wall thickening. No pericholecystic fluid or sonographic Murphy sign. Common bile duct: Diameter: 9 mm, increased from 4 mm yesterday Liver: Normal echogenicity without mass or nodularity. Mild central intrahepatic biliary dilatation new since previous exam. Portal vein is patent on color Doppler imaging with normal direction of blood flow towards the liver. Other: No RIGHT upper quadrant free fluid. IMPRESSION: Sludge within gallbladder with minimal gallbladder wall thickening but no definite calculi visualized. Interval increase in diameter of CBD now 9 mm (4 mm yesterday) with associated mild intrahepatic biliary dilatation centrally; CBD obstruction not excluded and in the setting of elevated LFTs, consider MRCP imaging. Electronically Signed   By: Lavonia Dana M.D.   On: 05/08/2020 16:30    Pending Labs Unresulted Labs (From admission, onward)    Start     Ordered   05/09/20 0500  CBC  Tomorrow morning,   R     05/08/20 2216   05/09/20 0500  Comprehensive metabolic panel  Tomorrow morning,   R     05/08/20 2216   05/08/20 2217  TSH  Once,   STAT     05/08/20 2216   05/08/20 1159  Blood culture (routine x 2)  BLOOD CULTURE X 2,   STAT     05/08/20 1200          Vitals/Pain Today's Vitals   05/08/20 1849 05/08/20 2049 05/08/20 2100 05/08/20 2200  BP: (!) 179/57 (!) 190/60 (!) 172/61 (!) 169/58  Pulse: 62 71 70 70  Resp: 18 16 16 14   Temp:      TempSrc:      SpO2: 99% 98% 95% 95%   Weight:      Height:        Isolation Precautions No active isolations  Medications Medications  hydrALAZINE (APRESOLINE) injection 10 mg (has no administration in time range)  0.9 %  sodium chloride infusion (has no administration in time range)  ondansetron (ZOFRAN) tablet 4 mg (has no administration in time range)    Or  ondansetron (ZOFRAN) injection 4 mg (has no administration in time range)  heparin injection 5,000 Units (has no administration in time range)  fentaNYL (SUBLIMAZE) injection  12.5 mcg (has no administration in time range)  levothyroxine (SYNTHROID, LEVOTHROID) injection 25 mcg (has no administration in time range)  cefTRIAXone (ROCEPHIN) 2 g in sodium chloride 0.9 % 100 mL IVPB (has no administration in time range)  metroNIDAZOLE (FLAGYL) IVPB 500 mg (has no administration in time range)  cefTRIAXone (ROCEPHIN) 2 g in sodium chloride 0.9 % 100 mL IVPB (0 g Intravenous Stopped 05/08/20 1600)  metroNIDAZOLE (FLAGYL) IVPB 500 mg (0 mg Intravenous Stopped 05/08/20 2158)    Mobility walks

## 2020-05-08 NOTE — Telephone Encounter (Signed)
Lab called w pos blood culture for ecoli,  Per DR Ronnald Nian pt needs to return to ED for additional treatment.  Called pt , per family pt was able to ambulate w walker this am, but is pale and glassy eye  Will talked pt back to [facility] ed

## 2020-05-08 NOTE — ED Notes (Signed)
Pt ate two graham crackers and drank a cup of water with no nausea or vomiting

## 2020-05-08 NOTE — Progress Notes (Signed)
Patient was transferred from ED to [facility] at 2320. RN received report at 1048. Patient is alert and oriented x 3. No pain complained. No skin issue. Vital signs was taken. Room is set up and call light is within patient's reach.

## 2020-05-08 NOTE — ED Notes (Signed)
Spoke to Hospitalist about switching pt to tele versus stepdown. Hospitalist stated he would change the bed request.

## 2020-05-08 NOTE — Consult Note (Signed)
Referring Provider: No ref. provider found Primary Care Physician:  Gaynelle Arabian, MD Primary Gastroenterologist:  Dr. Cannon Kettle  Reason for Consultation: Probable cholangitis  HPI: Alicia Sellers is a 86 y.o. female with chronic renal insufficiency (creatinine around 4), and congestive heart failure (ejection fraction by echo November 2020 was 20 to 25%) who was seen in the emergency room yesterday afternoon with vague right upper quadrant and "chest" discomfort, as well as nausea and vomiting, of 1 days duration.  Gallbladder sludge was noted on ultrasound.    Her symptoms settled down in the emergency room and she was released.  However, subsequently, her blood culture came back positive for E. coli, so she was advised to return to the emergency room today, basically in a stable or perhaps even slightly improved condition.  Today in the emergency room, her liver chemistries (which are normal at baseline) have spiked to the 200 range, total bilirubin is mildly elevated at 1.7, her white count has risen to 13,700, but lipase has remained normal.   A repeat ultrasound shows persistent sludge in the gallbladder without gallbladder wall thickening or pericholecystic fluid, but overnight her bile duct diameter has increased from 4 mm to 9 mm.   She has not had fevers or chills.  She has not had precordial chest pain, dyspnea, or significant urinary symptoms.  The patient's most recent colonoscopy was in 2012, with no further polyp surveillance anticipated in view of her age.  She has never had endoscopic evaluation.   Past Medical History:  Diagnosis Date  . Acute on chronic left systolic heart failure ([place]) 10/26/2019  . Anemia   . Anxiety   . Arthritis   . Chronic kidney disease   . Dyspnea   . Headache   . Hypertension   . Hypothyroidism     Past Surgical History:  Procedure Laterality Date  . APPENDECTOMY    . BREAST BIOPSY Left 2015   benign  . EYE SURGERY    .  TONSILLECTOMY      Prior to Admission medications   Medication Sig Start Date End Date Taking? Authorizing Provider  amLODipine (NORVASC) 5 MG tablet Take 0.5 tablets (2.5 mg total) by mouth daily. 10/30/19  Yes [name], MD  aspirin 81 MG chewable tablet Chew 81 mg by mouth at bedtime.    Yes [provider]  citalopram (CELEXA) 10 MG tablet Take 10 mg by mouth daily.   Yes [provider]  furosemide (LASIX) 40 MG tablet Take 1 tablet (40 mg total) by mouth daily. Patient taking differently: Take 20 mg by mouth daily.  10/31/19  Yes [name], MD  gemfibrozil (LOPID) 600 MG tablet Take 600 mg by mouth 2 (two) times daily before a meal.   Yes [provider]  hydrALAZINE (APRESOLINE) 25 MG tablet Take 50 mg by mouth in the morning and at bedtime.    Yes [provider]  levothyroxine (SYNTHROID, LEVOTHROID) 50 MCG tablet Take 50 mcg by mouth daily before breakfast.   Yes [provider]  LORazepam (ATIVAN) 0.5 MG tablet Take 0.25 mg by mouth at bedtime.    Yes [provider]  metoprolol succinate (TOPROL-XL) 25 MG 24 hr tablet Take 12.5 mg by mouth every evening.    Yes [provider]  Omega-3 Fatty Acids (FISH OIL) 1000 MG CAPS Take 1 capsule (1,000 mg total) by mouth 2 (two) times daily with a meal. 12/05/18  Yes Gherghe, Vella Redhead, MD  ondansetron (  ZOFRAN) 8 MG tablet Take 1 tablet (8 mg total) by mouth every 8 (eight) hours as needed for nausea or vomiting. 05/08/20  Yes Daleen Bo, MD  Red Yeast Rice 600 MG TABS Take 600 tablets by mouth 2 (two) times daily.   Yes [provider]  sodium bicarbonate 650 MG tablet Take 650 mg by mouth daily.  07/19/19  Yes [provider]  vitamin E 400 UNIT capsule Take 400 Units by mouth daily.   Yes [provider]  neomycin-polymyxin-hydrocortisone (CORTISPORIN) OTIC solution Apply 1 to 2 drops to toe BID after soaking Patient not taking: Reported on  05/07/2020 04/10/20   Wallene Huh, DPM  polyethylene glycol (MIRALAX / GLYCOLAX) 17 g packet Take 17 g by mouth daily. Patient not taking: Reported on 05/07/2020 10/31/19   [name], MD    No current facility-administered medications for this encounter.   Current Outpatient Medications  Medication Sig Dispense Refill  . amLODipine (NORVASC) 5 MG tablet Take 0.5 tablets (2.5 mg total) by mouth daily. 15 tablet 2  . aspirin 81 MG chewable tablet Chew 81 mg by mouth at bedtime.     . citalopram (CELEXA) 10 MG tablet Take 10 mg by mouth daily.    . furosemide (LASIX) 40 MG tablet Take 1 tablet (40 mg total) by mouth daily. (Patient taking differently: Take 20 mg by mouth daily. ) 30 tablet 3  . gemfibrozil (LOPID) 600 MG tablet Take 600 mg by mouth 2 (two) times daily before a meal.    . hydrALAZINE (APRESOLINE) 25 MG tablet Take 50 mg by mouth in the morning and at bedtime.     Marland Kitchen levothyroxine (SYNTHROID, LEVOTHROID) 50 MCG tablet Take 50 mcg by mouth daily before breakfast.    . LORazepam (ATIVAN) 0.5 MG tablet Take 0.25 mg by mouth at bedtime.     . metoprolol succinate (TOPROL-XL) 25 MG 24 hr tablet Take 12.5 mg by mouth every evening.     . Omega-3 Fatty Acids (FISH OIL) 1000 MG CAPS Take 1 capsule (1,000 mg total) by mouth 2 (two) times daily with a meal.  0  . ondansetron (ZOFRAN) 8 MG tablet Take 1 tablet (8 mg total) by mouth every 8 (eight) hours as needed for nausea or vomiting. 20 tablet 0  . Red Yeast Rice 600 MG TABS Take 600 tablets by mouth 2 (two) times daily.    . sodium bicarbonate 650 MG tablet Take 650 mg by mouth daily.     . vitamin E 400 UNIT capsule Take 400 Units by mouth daily.    Marland Kitchen neomycin-polymyxin-hydrocortisone (CORTISPORIN) OTIC solution Apply 1 to 2 drops to toe BID after soaking (Patient not taking: Reported on 05/07/2020) 10 mL 0  . polyethylene glycol (MIRALAX / GLYCOLAX) 17 g packet Take 17 g by mouth daily. (Patient not taking: Reported on 05/07/2020) 30  packet 3    Allergies as of 05/08/2020 - Review Complete 05/08/2020  Allergen Reaction Noted  . Latex Rash 11/10/2018  . Penicillins Rash 01/06/2012    Family History  Problem Relation Age of Onset  . Breast cancer Neg Hx     Social History   Socioeconomic History  . Marital status: Widowed    Spouse name: Not on file  . Number of children: 2  . Years of education: Associate Degree  . Highest education level: Associate degree: occupational, Hotel manager, or vocational program  Occupational History  . Occupation: Retired  Tobacco Use  . Smoking status:  Never Smoker  . Smokeless tobacco: Never Used  Substance and Sexual Activity  . Alcohol use: Never  . Drug use: Never  . Sexual activity: Not Currently    Birth control/protection: Post-menopausal  Other Topics Concern  . Not on file  Social History Narrative  . Not on file   Social Determinants of Health   Financial Resource Strain:   . Difficulty of Paying Living Expenses:   Food Insecurity:   . Worried About Charity fundraiser in the Last Year:   . Arboriculturist in the Last Year:   Transportation Needs:   . Film/video editor (Medical):   Marland Kitchen Lack of Transportation (Non-Medical):   Physical Activity:   . Days of Exercise per Week:   . Minutes of Exercise per Session:   Stress:   . Feeling of Stress :   Social Connections:   . Frequency of Communication with Friends and Family:   . Frequency of Social Gatherings with Friends and Family:   . Attends Religious Services:   . Active Member of Clubs or Organizations:   . Attends Archivist Meetings:   Marland Kitchen Marital Status:   Intimate Partner Violence:   . Fear of Current or Ex-Partner:   . Emotionally Abused:   Marland Kitchen Physically Abused:   . Sexually Abused:     Review of Systems: At baseline, the patient can get around with the help of a walker without undue dyspnea on mild exertion.  No home oxygen or blood thinners.  No chronic cough, precordial chest  pain, urinary symptoms such as dysuria or hematuria, unusual joint swelling, or focal neurologic symptoms  Physical Exam: Vital signs in last 24 hours: Temp:  [98.2 F (36.8 C)-100.6 F (38.1 C)] 99.5 F (37.5 C) (06/02 1206) Pulse Rate:  [60-84] 64 (06/02 1736) Resp:  [14-19] 19 (06/02 1736) BP: (122-172)/(43-98) 172/59 (06/02 1736) SpO2:  [92 %-100 %] 97 % (06/02 1736) Weight:  [49.9 kg] 49.9 kg (06/02 1100)   General:   Alert, slightly frail in appearance, but well-developed, well-nourished, pleasant and cooperative in NAD Head:  Normocephalic and atraumatic. Eyes:  Sclera clear, no icterus.   Conjunctiva pink. Mouth:   No ulcerations or lesions.  Oropharynx pink & moist. Neck:   No masses or thyromegaly. Lungs:  Clear throughout to auscultation.   No wheezes, crackles, or rhonchi. No evident respiratory distress. Heart:   Regular rate and rhythm; no murmurs, clicks, rubs,  or gallops. Abdomen:  Soft, nontender,  and nondistended. No masses, hepatosplenomegaly or ventral hernias noted. Normal bowel sounds, without bruits, guarding, or rebound.  Overall, a benign abdominal exam with particular reference to the right upper quadrant. Msk:   Symmetrical without gross deformities. Pulses: Full radial  Pulse is noted. Extremities:   Without clubbing, cyanosis, or edema. Neurologic:  Alert and coherent;  grossly normal neurologically. Skin:  Intact without significant lesions or rashes. Cervical Nodes:  No significant cervical adenopathy. Psych:   Alert and cooperative. Normal mood and affect.  Intake/Output from previous day: No intake/output data recorded. Intake/Output this shift: Total I/O In: 100 [IV Piggyback:100] Out: -   Lab Results: Recent Labs    05/07/20 1417 05/08/20 1231  WBC 1.3* 13.7*  HGB 11.2* 10.5*  HCT 35.5* 32.6*  PLT 284 265   BMET Recent Labs    05/07/20 1417 05/08/20 1231  NA 140 136  K 4.7 4.8  CL 106 103  CO2 20* 17*  GLUCOSE 108* 78  BUN  73* 73*  CREATININE 3.90* 3.89*  CALCIUM 9.5 8.9   LFT Recent Labs    05/08/20 1231  PROT 7.0  ALBUMIN 3.7  AST 208*  ALT 127*  ALKPHOS 111  BILITOT 1.7*   PT/INR No results for input(s): LABPROT, INR in the last 72 hours.  Studies/Results: CT ABDOMEN PELVIS WO CONTRAST  Result Date: 05/07/2020 CLINICAL DATA:  Nausea, vomiting EXAM: CT ABDOMEN AND PELVIS WITHOUT CONTRAST TECHNIQUE: Multidetector CT imaging of the abdomen and pelvis was performed following the standard protocol without IV contrast. COMPARISON:  Ultrasound 07/22/2018 FINDINGS: Lower chest: Bibasilar atelectasis or scarring. No acute abnormality. Hepatobiliary: Gallbladder is distended. No visible stones. Suspect mild intrahepatic biliary ductal dilatation. Common bile duct appears to be normal caliber. Pancreas: No focal abnormality or ductal dilatation. Spleen: No focal abnormality.  Normal size. Adrenals/Urinary Tract: Renovascular calcifications within the renal hila bilaterally. No urinary tract stones or hydronephrosis. Adrenal glands and urinary bladder unremarkable. Stomach/Bowel: Diffuse colonic diverticulosis. No active diverticulitis. Stomach and small bowel decompressed, unremarkable. Vascular/Lymphatic: Aortic atherosclerosis. No enlarged abdominal or pelvic lymph nodes. Reproductive: Uterus and adnexa unremarkable.  No mass. Other: No free fluid or free air. Musculoskeletal: No acute bony abnormality. IMPRESSION: Diffuse colonic diverticulosis. No inflammatory changes to suggest active diverticulitis. Distention of the gallbladder. There appears to be mild intrahepatic biliary ductal dilatation. No visible stones. This could be further evaluated with right upper quadrant ultrasound if felt clinically indicated. Aortic atherosclerosis. Electronically Signed   By: Rolm Baptise M.D.   On: 05/07/2020 18:34   DG Chest 2 View  Result Date: 05/08/2020 CLINICAL DATA:  Fever. EXAM: CHEST - 2 VIEW COMPARISON:  December 13, 2019. FINDINGS: Stable cardiomegaly. Atherosclerosis of thoracic aorta is noted. No pneumothorax or pleural effusion is noted. No acute pulmonary disease is noted. Bony thorax is unremarkable. IMPRESSION: No active cardiopulmonary disease. Aortic atherosclerosis. Electronically Signed   By: Marijo Conception M.D.   On: 05/08/2020 12:41   US Abdomen Complete  Result Date: 05/07/2020 CLINICAL DATA:  Upper abdominal pain for 2 days EXAM: ABDOMEN ULTRASOUND COMPLETE COMPARISON:  None. FINDINGS: Gallbladder: Layering hyperdense sludge is seen. No sonographic Murphy sign noted by sonographer. No gallbladder wall thickening. Common bile duct: Diameter: 4 mm Liver: No focal lesion identified. Within normal limits in parenchymal echogenicity. Portal vein is patent on color Doppler imaging with normal direction of blood flow towards the liver. IVC: No abnormality visualized. Pancreas: Visualized portion unremarkable. Spleen: Size and appearance within normal limits. Right Kidney: Length: 8.3 cm. There is increased echogenicity seen throughout the right kidney. Trace free fluid seen around the right kidney. Left Kidney: Length: 7.8 cm. Echogenicity within normal limits. There is a complex anechoic cyst seen within the lower pole measuring 1.4 x 1.3 x 1.3 cm. Abdominal aorta: Limited due to overlying bowel gas Other findings: None. IMPRESSION: Layering hyperdense sludge.  No evidence of acute cholecystitis. Electronically Signed   By: Prudencio Pair M.D.   On: 05/07/2020 23:05   US Abdomen Limited RUQ  Result Date: 05/08/2020 CLINICAL DATA:  Elevated LFTs, positive blood cultures, sludge seen on yesterday exam, continued RIGHT upper quadrant pain question acute cholecystitis EXAM: ULTRASOUND ABDOMEN LIMITED RIGHT UPPER QUADRANT COMPARISON:  05/07/2020 FINDINGS: Gallbladder: Significant sludge in gallbladder. No definite shadowing calculi. Minimal gallbladder wall thickening. No pericholecystic fluid or sonographic Murphy  sign. Common bile duct: Diameter: 9 mm, increased from 4 mm yesterday Liver: Normal echogenicity without mass or nodularity. Mild central intrahepatic biliary dilatation  new since previous exam. Portal vein is patent on color Doppler imaging with normal direction of blood flow towards the liver. Other: No RIGHT upper quadrant free fluid. IMPRESSION: Sludge within gallbladder with minimal gallbladder wall thickening but no definite calculi visualized. Interval increase in diameter of CBD now 9 mm (4 mm yesterday) with associated mild intrahepatic biliary dilatation centrally; CBD obstruction not excluded and in the setting of elevated LFTs, consider MRCP imaging. Electronically Signed   By: Lavonia Dana M.D.   On: 05/08/2020 16:30    Impression: 1.  Recent nausea, vomiting, and upper abdominal/lower chest discomfort 2.  Gallbladder sludge 3.  New onset elevated liver chemistries 4.  E. coli bacteremia 5.  New onset biliary ductal dilatation 6.  Advanced chronic renal insufficiency (creatinine 4) 7.  Congestive heart failure, EF in the 20 to 25% range (not on blood thinners other than low-dose aspirin)  Discussion: The overall picture to me is most consistent with low-grade septic cholangitis.  Acute cholecystitis seems less likely given the absence of overt gallbladder wall thickening or right upper quadrant tenderness.  The bump in liver chemistries and the abrupt increase in CBD diameter suggest a common duct obstruction.  Plan: 1.  MRCP without IV contrast tonight  2.  We will be prepared to do ERCP tomorrow, depending on the MRCP results and schedule availability.  The nature, purpose, and risks of ERCP with sphincterotomy in this setting were discussed with the patient and her daughter-in-law, Alicia Sellers, at the bedside.  This included a roughly 3% risk of pancreatitis, anesthesia risk which is somewhat greater than average, given her reduced ejection fraction, perforation, infection, and  bleeding.  3.  In the meantime, IV antibiotics and n.p.o.   LOS: 0 days   Youlanda Mighty Emmabelle Fear  05/08/2020, 5:42 PM   Pager [phone] If no answer or after 5 PM call [phone]

## 2020-05-09 ENCOUNTER — Encounter (HOSPITAL_COMMUNITY)
Admission: EM | Disposition: A | Discharge status: Home / Self Care | Payer: Self-pay | Source: Home / Self Care | Attending: Internal Medicine

## 2020-05-09 ENCOUNTER — Inpatient Hospital Stay (HOSPITAL_COMMUNITY): Payer: Medicare HMO

## 2020-05-09 DIAGNOSIS — I5022 Chronic systolic (congestive) heart failure: Secondary | ICD-10-CM

## 2020-05-09 DIAGNOSIS — R1010 Upper abdominal pain, unspecified: Secondary | ICD-10-CM

## 2020-05-09 LAB — CBC
HCT: 30.9 % — ABNORMAL LOW (ref 36.0–46.0)
Hemoglobin: 9.9 g/dL — ABNORMAL LOW (ref 12.0–15.0)
MCH: 33.1 pg (ref 26.0–34.0)
MCHC: 32 g/dL (ref 30.0–36.0)
MCV: 103.3 fL — ABNORMAL HIGH (ref 80.0–100.0)
Platelets: 230 10*3/uL (ref 150–400)
RBC: 2.99 MIL/uL — ABNORMAL LOW (ref 3.87–5.11)
RDW: 15.2 % (ref 11.5–15.5)
WBC: 8.9 10*3/uL (ref 4.0–10.5)
nRBC: 0 % (ref 0.0–0.2)

## 2020-05-09 LAB — COMPREHENSIVE METABOLIC PANEL
ALT: 82 U/L — ABNORMAL HIGH (ref 0–44)
AST: 105 U/L — ABNORMAL HIGH (ref 15–41)
Albumin: 3.1 g/dL — ABNORMAL LOW (ref 3.5–5.0)
Alkaline Phosphatase: 97 U/L (ref 38–126)
Anion gap: 12 (ref 5–15)
BUN: 82 mg/dL — ABNORMAL HIGH (ref 8–23)
CO2: 16 mmol/L — ABNORMAL LOW (ref 22–32)
Calcium: 8.4 mg/dL — ABNORMAL LOW (ref 8.9–10.3)
Chloride: 109 mmol/L (ref 98–111)
Creatinine, Ser: 4.19 mg/dL — ABNORMAL HIGH (ref 0.44–1.00)
GFR calc Af Amer: 10 mL/min — ABNORMAL LOW (ref >60)
GFR calc non Af Amer: 9 mL/min — ABNORMAL LOW (ref >60)
Glucose, Bld: 40 mg/dL — CL (ref 70–99)
Potassium: 5 mmol/L (ref 3.5–5.1)
Sodium: 137 mmol/L (ref 135–145)
Total Bilirubin: 1.6 mg/dL — ABNORMAL HIGH (ref 0.3–1.2)
Total Protein: 6 g/dL — ABNORMAL LOW (ref 6.5–8.1)

## 2020-05-09 LAB — GLUCOSE, CAPILLARY
Glucose-Capillary: 51 mg/dL — ABNORMAL LOW (ref 70–99)
Glucose-Capillary: 105 mg/dL — ABNORMAL HIGH (ref 70–99)

## 2020-05-09 LAB — TSH: TSH: 2.342 u[IU]/mL (ref 0.350–4.500)

## 2020-05-09 SURGERY — ENDOSCOPIC RETROGRADE CHOLANGIOPANCREATOGRAPHY (ERCP) WITH PROPOFOL
Anesthesia: General

## 2020-05-09 MED ORDER — TECHNETIUM TC 99M MEBROFENIN IV KIT
4.7500 | PACK | Freq: Once | INTRAVENOUS | Status: AC
  Administered 2020-05-09: 4.75 via INTRAVENOUS

## 2020-05-09 MED ORDER — DEXTROSE 10 % IV SOLN: INTRAVENOUS | Status: DC

## 2020-05-09 MED ORDER — DEXTROSE-NACL 5-0.9 % IV SOLN: INTRAVENOUS | Status: DC

## 2020-05-09 MED ORDER — METOPROLOL TARTRATE 5 MG/5ML IV SOLN
2.5000 mg | Freq: Four times a day (QID) | INTRAVENOUS | Status: DC
  Administered 2020-05-09 – 2020-05-11 (×8): 2.5 mg via INTRAVENOUS
  Filled 2020-05-09 (×8): qty 5

## 2020-05-09 MED ORDER — ACETAMINOPHEN 325 MG PO TABS
650.0000 mg | ORAL_TABLET | Freq: Once | ORAL | Status: AC
  Administered 2020-05-09: 650 mg via ORAL
  Filled 2020-05-09: qty 2

## 2020-05-09 NOTE — Progress Notes (Signed)
CRITICAL VALUE ALERT  Critical Value:  CBG 51  Date & Time Notied:  6/3 @ [phone]  Provider Notified: Dr. Vicente Serene  Orders Received/Actions taken: IV fluids changed to D10

## 2020-05-09 NOTE — Progress Notes (Signed)
PHARMACY - PHYSICIAN COMMUNICATION CRITICAL VALUE ALERT - BLOOD CULTURE IDENTIFICATION (BCID)  Alicia Sellers is an 86 y.o. female who presented to [facility] on 05/08/2020 with a chief complaint of abdominal pain, n/v. Found to have sepsis d/t possible cholangitis  Assessment:  GNR in 1/2 BCx (suspect biliary source)  Name of physician (or Provider) Contacted: Rai  Current antibiotics: Rocephin, Flagyl  Changes to prescribed antibiotics recommended: none Patient is on recommended antibiotics - No changes needed  Results for orders placed or performed during the hospital encounter of 05/07/20  Blood Culture ID Panel (Reflexed) (Collected: 05/07/2020  2:37 PM)  Result Value Ref Range   Enterococcus species NOT DETECTED NOT DETECTED   Listeria monocytogenes NOT DETECTED NOT DETECTED   Staphylococcus species NOT DETECTED NOT DETECTED   Staphylococcus aureus (BCID) NOT DETECTED NOT DETECTED   Streptococcus species NOT DETECTED NOT DETECTED   Streptococcus agalactiae NOT DETECTED NOT DETECTED   Streptococcus pneumoniae NOT DETECTED NOT DETECTED   Streptococcus pyogenes NOT DETECTED NOT DETECTED   Acinetobacter baumannii NOT DETECTED NOT DETECTED   Enterobacteriaceae species DETECTED (A) NOT DETECTED   Enterobacter cloacae complex NOT DETECTED NOT DETECTED   Escherichia coli DETECTED (A) NOT DETECTED   Klebsiella oxytoca NOT DETECTED NOT DETECTED   Klebsiella pneumoniae DETECTED (A) NOT DETECTED   Proteus species NOT DETECTED NOT DETECTED   Serratia marcescens NOT DETECTED NOT DETECTED   Carbapenem resistance NOT DETECTED NOT DETECTED   Haemophilus influenzae NOT DETECTED NOT DETECTED   Neisseria meningitidis NOT DETECTED NOT DETECTED   Pseudomonas aeruginosa NOT DETECTED NOT DETECTED   Candida albicans NOT DETECTED NOT DETECTED   Candida glabrata NOT DETECTED NOT DETECTED   Candida krusei NOT DETECTED NOT DETECTED   Candida parapsilosis NOT DETECTED NOT DETECTED   Candida  tropicalis NOT DETECTED NOT DETECTED    Kailly Richoux A 05/09/2020  10:38 AM

## 2020-05-09 NOTE — Progress Notes (Signed)
CRITICAL VALUE ALERT  Critical Value:  Glucose= 40  Date & Time Notied:  05/09/2020 at 0623  Provider Notified: MD Floor coverage  Orders Received/Actions taken: waiting for new order.

## 2020-05-09 NOTE — Progress Notes (Signed)
[facility]                                                                              Patient Demographics  Alicia Sellers, is a 86 y.o. female, DOB - [birth year], DJT:[id number]  Admit date - 05/08/2020   Admitting Physician Rise Patience, MD  Outpatient Primary MD for the patient is Gaynelle Arabian, MD  Outpatient specialists:   LOS - 1  days   Medical records reviewed and are as summarized below:    Chief Complaint  Patient presents with  . Abnormal Lab       Brief summary   Patient is a 86 year old female with history of chronic systolic CHF, EF 20 to 30%, grade 1 diastolic dysfunction (2D echo 10/2019, primary cardiologist, Dr. Doylene Canard), hypertension, hyperlipidemia, CKD stage IV, hypothyroidism initially presented to ED on 05/07/2020 for nausea vomiting, abdominal pain and diarrhea.   No hematemesis, hematochezia or melena.  She described the abdominal pain as 9/10, intermittent and aching.  The pain worsened after she ate food.  Subsequently she started having chest pain and she got concerned and called 911.  No fevers or chills.,  No dysuria or hematuria. Work-up in the ED on 6/1 with CT abdomen showed no diverticulitis, distention of the gallbladder, no visible stones, mild transaminitis, neutropenia with white count of 1.3.  Creatinine close to her baseline 3.9. Patient subsequently felt better in ED, and was able to eat and drink water.  She felt that she could manage at home hence patient was discharged from ED. Patient was called at her home on 05/08/2020 for positive blood cultures + E. Coli.    She was admitted for further work-up. Assessment & Plan    Principal Problem:   Sepsis due to Escherichia coli (E. coli) ([place]), Klebsiella, septic cholangitis, transaminitis -Likely due to GI source, most likely due to septic cholangitis.  No acute cholecystitis on ultrasound or CT.  -  Ultrasound abdomen showed sludge within the gallbladder with minimal  gallbladder wall thickening, no definitive calculi, interval increase in CBD diameter now 9 mm (4 mm yesterday) -MRCP showed biliary sludge in the gallbladder, no acute cholecystitis, CBD mildly dilated 9 mm, likely within normal limits with patient's age, no choledocholithiasis.  Eccentric filling defect in common hepatic duct  -Continue n.p.o. status, gentle IV fluid hydration, changed to D10 drip due to hypoglycemia  (EF of 20 to 25%, avoid volume overload) -Continue IV Rocephin, Flagyl, follow blood cultures and sensitivity -LFTs trending down -GI following and general surgery also consulted will require ERCP +/-cholecystectomy.  Per surgery, patient likely not a good surgical candidate with cardiac issues and hypoglycemia -Cardiology consulted (Dr. Renaee Munda primary cardiologist)    Active Problems:   HTN (hypertension) -Currently n.p.o. status, new IV hydralazine as needed with parameters    Hypothyroidism -Continue Synthroid 25 MCG IV daily, while n.p.o.  -TSH 2.3    Anemia due to chronic kidney disease -Baseline 9 -11 -Currently 9.9, close to baseline    Chronic combined systolic and diastolic CHF (congestive heart failure) ([place]) -2D echo 10/2019 had shown EF of 20 to 25% with grade 1 diastolic  dysfunction -Holding Lasix due to n.p.o. status, appeared to be volume depleted at the time of admission with vomiting and diarrhea  -Currently on gentle hydration  CKD stage IV, AG metabolic acidosis -Baseline Cr 4.2-4.5 -Creatinine currently within the baseline range however CO2 trending down, AG 12 -If patient remains n.p.o., will need to place on bicarb/D5 drip.  If able to take p.o., will resume outpatient dose of sodium bicarb tabs  Code Status: Full CODE STATUS DVT Prophylaxis: Heparin subcu Family Communication: Discussed all imaging results, lab results, explained to the patient and granddaughter at the bedside   Disposition Plan:     Status is:  Inpatient  Remains inpatient appropriate because:Inpatient level of care appropriate due to severity of illness   Dispo: The patient is from: Home              Anticipated d/c is to: Home              Anticipated d/c date is: 3 days              Patient currently is not medically stable to d/c.       Time Spent in minutes   35 minutes  Procedures:  MRCP  Consultants:   Gastroenterology General surgery Cardiology  Antimicrobials:   Anti-infectives (From admission, onward)   Start     Dose/Rate Route Frequency Ordered Stop   05/09/20 1400  cefTRIAXone (ROCEPHIN) 2 g in sodium chloride 0.9 % 100 mL IVPB     2 g 200 mL/hr over 30 Minutes Intravenous Every 24 hours 05/08/20 1811     05/09/20 0600  metroNIDAZOLE (FLAGYL) IVPB 500 mg     500 mg 100 mL/hr over 60 Minutes Intravenous Every 8 hours 05/08/20 1811     05/08/20 1815  metroNIDAZOLE (FLAGYL) IVPB 500 mg     500 mg 100 mL/hr over 60 Minutes Intravenous NOW 05/08/20 1810 05/08/20 2158   05/08/20 1445  cefTRIAXone (ROCEPHIN) 2 g in sodium chloride 0.9 % 100 mL IVPB     2 g 200 mL/hr over 30 Minutes Intravenous  Once 05/08/20 1433 05/08/20 1600          Medications  Scheduled Meds: . heparin  5,000 Units Subcutaneous Q8H  . levothyroxine  25 mcg Intravenous Daily  . technetium TC 47M mebrofenin  9.32 millicurie Intravenous Once   Continuous Infusions: . sodium chloride    . cefTRIAXone (ROCEPHIN)  IV    . dextrose 60 mL/hr at 05/09/20 0852  . metronidazole 500 mg (05/09/20 0559)   PRN Meds:.fentaNYL (SUBLIMAZE) injection, hydrALAZINE, ondansetron **OR** ondansetron (ZOFRAN) IV      Subjective:   Alicia Sellers was seen and examined today.  Denies any specific complaints this morning, still has abdominal discomfort in the right upper quadrant.  No acute nausea or vomiting. Patient denies dizziness, chest pain, shortness of breath,  new weakness, numbess, tingling. No acute events overnight.  No  fevers.  Objective:   Vitals:   05/08/20 2348 05/08/20 2356 05/09/20 0302 05/09/20 0728  BP: (!) 140/95 138/80 (!) 155/54 (!) 156/54  Pulse: (!) 124 75 79 79  Resp: 18  18 16   Temp: 98.8 F (37.1 C) 98.6 F (37 C) 98.6 F (37 C) 98.6 F (37 C)  TempSrc: Oral Oral  Oral  SpO2: 100% 100% 95% 96%  Weight:      Height:        Intake/Output Summary (Last 24 hours) at 05/09/2020 1129 Last data filed at  05/09/2020 0639 Gross per 24 hour  Intake 592.06 ml  Output --  Net 592.06 ml     Wt Readings from Last 3 Encounters:  05/08/20 49.9 kg  11/08/19 53.5 kg  10/29/19 55 kg     Exam  General: Alert and oriented x 3, NAD  Cardiovascular: S1 S2 auscultated, no murmurs, RRR  Respiratory: Clear to auscultation bilaterally, no wheezing, rales or rhonchi  Gastrointestinal: Soft, RUQ TTP , nondistended, + bowel sounds  Ext: no pedal edema bilaterally  Neuro: No new deficits   musculoskeletal: No digital cyanosis, clubbing  Skin: No rashes  Psych: Normal affect and demeanor, alert and oriented x3    Data Reviewed:  I have personally reviewed following labs and imaging studies  Micro Results Recent Results (from the past 240 hour(s))  Blood culture (routine x 2)     Status: Abnormal (Preliminary result)   Collection Time: 05/07/20  2:37 PM   Specimen: BLOOD  Result Value Ref Range Status   Specimen Description   Final    BLOOD RIGHT ANTECUBITAL Performed at [facility], [address], [place], [place] [address]    Special Requests   Final    BOTTLES DRAWN AEROBIC AND ANAEROBIC Blood Culture adequate volume Performed at [place] [address], [place], [place] [address]    Culture  Setup Time   Final    GRAM NEGATIVE RODS IN BOTH AEROBIC AND ANAEROBIC BOTTLES CRITICAL RESULT CALLED TO, READ BACK BY AND VERIFIED WITH: RN Inocente Salles C [phone] FCP    Culture (A)  Final    ESCHERICHIA COLI KLEBSIELLA PNEUMONIAE CULTURE REINCUBATED FOR  BETTER GROWTH Performed at [facility], [place] [address] [place], [place] [address]    Report Status PENDING  Incomplete  Blood Culture ID Panel (Reflexed)     Status: Abnormal   Collection Time: 05/07/20  2:37 PM  Result Value Ref Range Status   Enterococcus species NOT DETECTED NOT DETECTED Final   Listeria monocytogenes NOT DETECTED NOT DETECTED Final   Staphylococcus species NOT DETECTED NOT DETECTED Final   Staphylococcus aureus (BCID) NOT DETECTED NOT DETECTED Final   Streptococcus species NOT DETECTED NOT DETECTED Final   Streptococcus agalactiae NOT DETECTED NOT DETECTED Final   Streptococcus pneumoniae NOT DETECTED NOT DETECTED Final   Streptococcus pyogenes NOT DETECTED NOT DETECTED Final   Acinetobacter baumannii NOT DETECTED NOT DETECTED Final   Enterobacteriaceae species DETECTED (A) NOT DETECTED Final    Comment: CRITICAL RESULT CALLED TO, READ BACK BY AND VERIFIED WITH: RN SAM C [id number] [phone] FCP    Enterobacter cloacae complex NOT DETECTED NOT DETECTED Final   Escherichia coli DETECTED (A) NOT DETECTED Final    Comment: CRITICAL RESULT CALLED TO, READ BACK BY AND VERIFIED WITH: RN SAM C [id number] [phone] FCP    Klebsiella oxytoca NOT DETECTED NOT DETECTED Final   Klebsiella pneumoniae DETECTED (A) NOT DETECTED Final    Comment: CRITICAL RESULT CALLED TO, READ BACK BY AND VERIFIED WITH: RN SAM C [id number] [phone] FCP    Proteus species NOT DETECTED NOT DETECTED Final   Serratia marcescens NOT DETECTED NOT DETECTED Final   Carbapenem resistance NOT DETECTED NOT DETECTED Final   Haemophilus influenzae NOT DETECTED NOT DETECTED Final   Neisseria meningitidis NOT DETECTED NOT DETECTED Final   Pseudomonas aeruginosa NOT DETECTED NOT DETECTED Final   Candida albicans NOT DETECTED NOT DETECTED Final   Candida glabrata NOT DETECTED NOT DETECTED Final   Candida  krusei NOT DETECTED NOT DETECTED Final   Candida parapsilosis NOT DETECTED NOT DETECTED Final   Candida tropicalis NOT  DETECTED NOT DETECTED Final    Comment: Performed at [facility], [place] [address] [place], [place] [address]  SARS Coronavirus 2 by RT PCR (hospital order, performed in [facility] hospital lab) Nasopharyngeal Nasopharyngeal Swab     Status: None   Collection Time: 05/07/20  4:38 PM   Specimen: Nasopharyngeal Swab  Result Value Ref Range Status   SARS Coronavirus 2 NEGATIVE NEGATIVE Final    Comment: (NOTE) SARS-CoV-2 target nucleic acids are NOT DETECTED. The SARS-CoV-2 RNA is generally detectable in upper and lower respiratory specimens during the acute phase of infection. The lowest concentration of SARS-CoV-2 viral copies this assay can detect is 250 copies / mL. A negative result does not preclude SARS-CoV-2 infection and should not be used as the sole basis for treatment or other patient management decisions.  A negative result may occur with improper specimen collection / handling, submission of specimen other than nasopharyngeal swab, presence of viral mutation(s) within the areas targeted by this assay, and inadequate number of viral copies (<250 copies / mL). A negative result must be combined with clinical observations, patient history, and epidemiological information. Fact Sheet for Patients:   StrictlyIdeas.no Fact Sheet for Healthcare Providers: BankingDealers.co.za This test is not yet approved or cleared  by the [place] FDA and has been authorized for detection and/or diagnosis of SARS-CoV-2 by FDA under an Emergency Use Authorization (EUA).  This EUA will remain in effect (meaning this test can be used) for the duration of the COVID-19 declaration under Section 564(b)(1) of the Act, 21 U.S.C. section 360bbb-3(b)(1), unless the authorization is terminated or revoked sooner. Performed at [facility], [place] [address] [place], [place] [address]   Urine culture     Status: None   Collection  Time: 05/07/20  9:38 PM   Specimen: Urine, Random  Result Value Ref Range Status   Specimen Description   Final    URINE, RANDOM Performed at [place] [address] [place], [place] [address]    Special Requests   Final    NONE Performed at [facility], [place] [address] [place], [place] [address]    Culture   Final    NO GROWTH Performed at [facility], [place] [address] [place], [place] [address]    Report Status 05/08/2020 FINAL  Final  Blood culture (routine x 2)     Status: None (Preliminary result)   Collection Time: 05/08/20 12:31 PM   Specimen: BLOOD  Result Value Ref Range Status   Specimen Description   Final    BLOOD LEFT ANTECUBITAL Performed at [place] [address] [place], [place] [address]    Special Requests   Final    BOTTLES DRAWN AEROBIC AND ANAEROBIC Blood Culture adequate volume Performed at [place] [address], [place], [place] [address]    Culture   Final    NO GROWTH < 24 HOURS Performed at [place] [address], [place], [place] [address]    Report Status PENDING  Incomplete  Blood culture (routine x 2)     Status: None (Preliminary result)   Collection Time: 05/08/20 12:31 PM   Specimen: BLOOD RIGHT FOREARM  Result Value Ref Range Status   Specimen Description   Final    BLOOD RIGHT FOREARM Performed  at [facility], [place] [address] [place], [place] [address]    Special Requests   Final    BOTTLES DRAWN AEROBIC AND ANAEROBIC Blood Culture results may not be optimal due to an excessive volume of blood received in culture bottles Performed at [place] [address], [place], [place] [address]    Culture  Setup Time   Final    GRAM NEGATIVE RODS AEROBIC BOTTLE ONLY CRITICAL RESULT CALLED TO, READ BACK BY AND VERIFIED WITH: PHARMD N [place] [id number] AT 652 AM BY CM    Culture   Final     NO GROWTH < 24 HOURS Performed at [facility], [place] [address] [place], [place] [address]    Report Status PENDING  Incomplete    Radiology Reports CT ABDOMEN PELVIS WO CONTRAST  Result Date: 05/07/2020 CLINICAL DATA:  Nausea, vomiting EXAM: CT ABDOMEN AND PELVIS WITHOUT CONTRAST TECHNIQUE: Multidetector CT imaging of the abdomen and pelvis was performed following the standard protocol without IV contrast. COMPARISON:  Ultrasound 07/22/2018 FINDINGS: Lower chest: Bibasilar atelectasis or scarring. No acute abnormality. Hepatobiliary: Gallbladder is distended. No visible stones. Suspect mild intrahepatic biliary ductal dilatation. Common bile duct appears to be normal caliber. Pancreas: No focal abnormality or ductal dilatation. Spleen: No focal abnormality.  Normal size. Adrenals/Urinary Tract: Renovascular calcifications within the renal hila bilaterally. No urinary tract stones or hydronephrosis. Adrenal glands and urinary bladder unremarkable. Stomach/Bowel: Diffuse colonic diverticulosis. No active diverticulitis. Stomach and small bowel decompressed, unremarkable. Vascular/Lymphatic: Aortic atherosclerosis. No enlarged abdominal or pelvic lymph nodes. Reproductive: Uterus and adnexa unremarkable.  No mass. Other: No free fluid or free air. Musculoskeletal: No acute bony abnormality. IMPRESSION: Diffuse colonic diverticulosis. No inflammatory changes to suggest active diverticulitis. Distention of the gallbladder. There appears to be mild intrahepatic biliary ductal dilatation. No visible stones. This could be further evaluated with right upper quadrant ultrasound if felt clinically indicated. Aortic atherosclerosis. Electronically Signed   By: Rolm Baptise M.D.   On: 05/07/2020 18:34   DG Chest 2 View  Result Date: 05/08/2020 CLINICAL DATA:  Fever. EXAM: CHEST - 2 VIEW COMPARISON:  December 13, 2019. FINDINGS: Stable cardiomegaly. Atherosclerosis of thoracic aorta is noted. No pneumothorax or  pleural effusion is noted. No acute pulmonary disease is noted. Bony thorax is unremarkable. IMPRESSION: No active cardiopulmonary disease. Aortic atherosclerosis. Electronically Signed   By: Marijo Conception M.D.   On: 05/08/2020 12:41   US Abdomen Complete  Result Date: 05/07/2020 CLINICAL DATA:  Upper abdominal pain for 2 days EXAM: ABDOMEN ULTRASOUND COMPLETE COMPARISON:  None. FINDINGS: Gallbladder: Layering hyperdense sludge is seen. No sonographic Murphy sign noted by sonographer. No gallbladder wall thickening. Common bile duct: Diameter: 4 mm Liver: No focal lesion identified. Within normal limits in parenchymal echogenicity. Portal vein is patent on color Doppler imaging with normal direction of blood flow towards the liver. IVC: No abnormality visualized. Pancreas: Visualized portion unremarkable. Spleen: Size and appearance within normal limits. Right Kidney: Length: 8.3 cm. There is increased echogenicity seen throughout the right kidney. Trace free fluid seen around the right kidney. Left Kidney: Length: 7.8 cm. Echogenicity within normal limits. There is a complex anechoic cyst seen within the lower pole measuring 1.4 x 1.3 x 1.3 cm. Abdominal aorta: Limited due to overlying bowel gas Other findings: None. IMPRESSION: Layering hyperdense sludge.  No evidence of acute cholecystitis. Electronically Signed   By: Prudencio Pair M.D.   On: 05/07/2020 23:05   MR  ABDOMEN MRCP WO CONTRAST  Result Date: 05/08/2020 CLINICAL DATA:  86 year old female with history of acute nonlocalized abdominal pain. Emesis. EXAM: MRI ABDOMEN WITHOUT CONTRAST  (INCLUDING MRCP) TECHNIQUE: Multiplanar multisequence MR imaging of the abdomen was performed. Heavily T2-weighted images of the biliary and pancreatic ducts were obtained, and three-dimensional MRCP images were rendered by post processing. COMPARISON:  No prior abdominal MRI. Abdominal ultrasound 05/08/2020. FINDINGS: Comment: Today's study is limited for detection and  characterization of visceral and/or vascular lesions by lack of IV gadolinium. Lower chest: Trace left pleural effusion. Hepatobiliary: Mild heterogeneous loss of signal intensity throughout the hepatic parenchyma on in phase dual echo images, suggesting heterogeneous hepatic iron deposition. This correlates to diffuse T2 hypointensity throughout the hepatic parenchyma. No discrete cystic or solid hepatic lesions are confidently identified on today's noncontrast examination. MRCP images demonstrate mild dilatation of the common bile duct which measures up to 9 mm in the porta hepatis, which could be age related. Notably, in the common hepatic duct there is a focal contour abnormality best appreciated on MRCP image 30 of series 12, which is poorly evaluated on today's noncontrast examination, but favored to be related to extrinsic compression from an adjacent vascular structure such as the common hepatic artery. No filling defect otherwise identified within the lumen of the common bile duct to suggest choledocholithiasis. There is some amorphous dependent material in the gallbladder suggesting biliary sludge. Gallbladder is moderately distended. No gallbladder wall thickening or pericholecystic fluid. Pancreas: No pancreatic mass or peripancreatic fluid collections or inflammatory changes noted on today's noncontrast examination. MRCP images demonstrate no pancreatic ductal dilatation. Spleen:  Unremarkable. Adrenals/Urinary Tract: In the lower pole of the left kidney there is a 1.3 cm T1 hypointense and T2 hyperintense lesion which is incompletely characterized on today's non-contrast examination, but favored to represent a cyst. Right kidney and bilateral adrenal glands are unremarkable in appearance. No hydroureteronephrosis in the visualized portions of the abdomen. Stomach/Bowel: Visualized portions are unremarkable. Vascular/Lymphatic: Aortic atherosclerosis, without evidence of aneurysm in the abdominal  vasculature. Other: No significant volume of ascites noted in the visualized portions of the peritoneal cavity. Musculoskeletal: No aggressive appearing osseous lesions are noted in the visualized portions of the skeleton. IMPRESSION: 1. Evidence of biliary sludge in the gallbladder, without evidence of acute cholecystitis. 2. Common bile duct is mildly dilated measuring 9 mm in the porta hepatis, however, this is likely within normal limits given the patient's advanced age. No choledocholithiasis. Additionally, there is no evidence of intrahepatic biliary ductal dilatation to suggest significant biliary tract obstruction. 3. Findings are suggestive of heterogeneous iron deposition in the liver, as discussed above. 4. Eccentric filling defect in the common hepatic duct which is favored to be related to extrinsic compression from an adjacent vascular structure, but is poorly evaluated on today's noncontrast examination. This could be more definitively evaluated with repeat abdominal MRI with and without IV gadolinium with MRCP if of clinical concern. 5. Aortic atherosclerosis. 6. Additional incidental findings, as above. Electronically Signed   By: Vinnie Langton M.D.   On: 05/08/2020 20:44   MR 3D Recon At Scanner  Result Date: 05/08/2020 CLINICAL DATA:  86 year old female with history of acute nonlocalized abdominal pain. Emesis. EXAM: MRI ABDOMEN WITHOUT CONTRAST  (INCLUDING MRCP) TECHNIQUE: Multiplanar multisequence MR imaging of the abdomen was performed. Heavily T2-weighted images of the biliary and pancreatic ducts were obtained, and three-dimensional MRCP images were rendered by post processing. COMPARISON:  No prior abdominal MRI. Abdominal ultrasound 05/08/2020. FINDINGS: Comment:  Today's study is limited for detection and characterization of visceral and/or vascular lesions by lack of IV gadolinium. Lower chest: Trace left pleural effusion. Hepatobiliary: Mild heterogeneous loss of signal intensity  throughout the hepatic parenchyma on in phase dual echo images, suggesting heterogeneous hepatic iron deposition. This correlates to diffuse T2 hypointensity throughout the hepatic parenchyma. No discrete cystic or solid hepatic lesions are confidently identified on today's noncontrast examination. MRCP images demonstrate mild dilatation of the common bile duct which measures up to 9 mm in the porta hepatis, which could be age related. Notably, in the common hepatic duct there is a focal contour abnormality best appreciated on MRCP image 30 of series 12, which is poorly evaluated on today's noncontrast examination, but favored to be related to extrinsic compression from an adjacent vascular structure such as the common hepatic artery. No filling defect otherwise identified within the lumen of the common bile duct to suggest choledocholithiasis. There is some amorphous dependent material in the gallbladder suggesting biliary sludge. Gallbladder is moderately distended. No gallbladder wall thickening or pericholecystic fluid. Pancreas: No pancreatic mass or peripancreatic fluid collections or inflammatory changes noted on today's noncontrast examination. MRCP images demonstrate no pancreatic ductal dilatation. Spleen:  Unremarkable. Adrenals/Urinary Tract: In the lower pole of the left kidney there is a 1.3 cm T1 hypointense and T2 hyperintense lesion which is incompletely characterized on today's non-contrast examination, but favored to represent a cyst. Right kidney and bilateral adrenal glands are unremarkable in appearance. No hydroureteronephrosis in the visualized portions of the abdomen. Stomach/Bowel: Visualized portions are unremarkable. Vascular/Lymphatic: Aortic atherosclerosis, without evidence of aneurysm in the abdominal vasculature. Other: No significant volume of ascites noted in the visualized portions of the peritoneal cavity. Musculoskeletal: No aggressive appearing osseous lesions are noted in the  visualized portions of the skeleton. IMPRESSION: 1. Evidence of biliary sludge in the gallbladder, without evidence of acute cholecystitis. 2. Common bile duct is mildly dilated measuring 9 mm in the porta hepatis, however, this is likely within normal limits given the patient's advanced age. No choledocholithiasis. Additionally, there is no evidence of intrahepatic biliary ductal dilatation to suggest significant biliary tract obstruction. 3. Findings are suggestive of heterogeneous iron deposition in the liver, as discussed above. 4. Eccentric filling defect in the common hepatic duct which is favored to be related to extrinsic compression from an adjacent vascular structure, but is poorly evaluated on today's noncontrast examination. This could be more definitively evaluated with repeat abdominal MRI with and without IV gadolinium with MRCP if of clinical concern. 5. Aortic atherosclerosis. 6. Additional incidental findings, as above. Electronically Signed   By: Vinnie Langton M.D.   On: 05/08/2020 20:44   US Abdomen Limited RUQ  Result Date: 05/08/2020 CLINICAL DATA:  Elevated LFTs, positive blood cultures, sludge seen on yesterday exam, continued RIGHT upper quadrant pain question acute cholecystitis EXAM: ULTRASOUND ABDOMEN LIMITED RIGHT UPPER QUADRANT COMPARISON:  05/07/2020 FINDINGS: Gallbladder: Significant sludge in gallbladder. No definite shadowing calculi. Minimal gallbladder wall thickening. No pericholecystic fluid or sonographic Murphy sign. Common bile duct: Diameter: 9 mm, increased from 4 mm yesterday Liver: Normal echogenicity without mass or nodularity. Mild central intrahepatic biliary dilatation new since previous exam. Portal vein is patent on color Doppler imaging with normal direction of blood flow towards the liver. Other: No RIGHT upper quadrant free fluid. IMPRESSION: Sludge within gallbladder with minimal gallbladder wall thickening but no definite calculi visualized. Interval  increase in diameter of CBD now 9 mm (4 mm yesterday) with associated mild  intrahepatic biliary dilatation centrally; CBD obstruction not excluded and in the setting of elevated LFTs, consider MRCP imaging. Electronically Signed   By: Lavonia Dana M.D.   On: 05/08/2020 16:30    Lab Data:  CBC: Recent Labs  Lab 05/07/20 1417 05/08/20 1231 05/09/20 0525  WBC 1.3* 13.7* 8.9  NEUTROABS 1.1* 12.6*  --   HGB 11.2* 10.5* 9.9*  HCT 35.5* 32.6* 30.9*  MCV 104.4* 103.8* 103.3*  PLT 284 265 355   Basic Metabolic Panel: Recent Labs  Lab 05/07/20 1417 05/08/20 1231 05/09/20 0525  NA 140 136 137  K 4.7 4.8 5.0  CL 106 103 109  CO2 20* 17* 16*  GLUCOSE 108* 78 40*  BUN 73* 73* 82*  CREATININE 3.90* 3.89* 4.19*  CALCIUM 9.5 8.9 8.4*   GFR: Estimated Creatinine Clearance: 7 mL/min (A) (by C-G formula based on SCr of 4.19 mg/dL (H)). Liver Function Tests: Recent Labs  Lab 05/07/20 1417 05/08/20 1231 05/09/20 0525  AST 58* 208* 105*  ALT 31 127* 82*  ALKPHOS 95 111 97  BILITOT 1.9* 1.7* 1.6*  PROT 6.9 7.0 6.0*  ALBUMIN 3.7 3.7 3.1*   Recent Labs  Lab 05/07/20 1417 05/08/20 1231  LIPASE 64* 22   No results for input(s): AMMONIA in the last 168 hours. Coagulation Profile: No results for input(s): INR, PROTIME in the last 168 hours. Cardiac Enzymes: No results for input(s): CKTOTAL, CKMB, CKMBINDEX, TROPONINI in the last 168 hours. BNP (last 3 results) No results for input(s): PROBNP in the last 8760 hours. HbA1C: No results for input(s): HGBA1C in the last 72 hours. CBG: Recent Labs  Lab 05/09/20 0833  GLUCAP 51*   Lipid Profile: No results for input(s): CHOL, HDL, LDLCALC, TRIG, CHOLHDL, LDLDIRECT in the last 72 hours. Thyroid Function Tests: Recent Labs    05/09/20 0525  TSH 2.342   Anemia Panel: No results for input(s): VITAMINB12, FOLATE, FERRITIN, TIBC, IRON, RETICCTPCT in the last 72 hours. Urine analysis:    Component Value Date/Time   COLORURINE  YELLOW 05/07/2020 2138   APPEARANCEUR CLEAR 05/07/2020 2138   LABSPEC 1.012 05/07/2020 2138   PHURINE 5.0 05/07/2020 2138   GLUCOSEU NEGATIVE 05/07/2020 2138   HGBUR NEGATIVE 05/07/2020 2138   BILIRUBINUR NEGATIVE 05/07/2020 2138   [place] NEGATIVE 05/07/2020 2138   PROTEINUR 100 (A) 05/07/2020 2138   NITRITE NEGATIVE 05/07/2020 2138   LEUKOCYTESUR NEGATIVE 05/07/2020 2138     Alicia Sellers M.D. [facility] 05/09/2020, 11:29 AM   Call night coverage person covering after 7pm

## 2020-05-09 NOTE — Consult Note (Signed)
Referring Physician:  Oluwaseyi Tull is an 86 y.o. female.                       Chief Complaint: Cardiology clearance/Evaluation  HPI: 86 years old white female with chronic systolic left heart failure, CKD, IV, HTN, hypothyroidism, arthritis and anemia of chronic disease has E.Coli sepsis. Her work-up is suggestive of septic cholangitis but ultrasound of abdomen and CT of abdomen was negative for diverticulitis and cholecystitis. She is on IV Rocephin and Flagyl. She is afebrile today and has episodes of hypoglycemia.  Her echocardiogram on 10/27/2019 showed severely reduced LV systolic function with EF 20-25 %, Moderate to severe MR and severe TR.  Past Medical History:  Diagnosis Date  . Acute on chronic left systolic heart failure ([place]) 10/26/2019  . Anemia   . Anxiety   . Arthritis   . Chronic kidney disease   . Dyspnea   . Headache   . Hypertension   . Hypothyroidism       Past Surgical History:  Procedure Laterality Date  . APPENDECTOMY    . BREAST BIOPSY Left 2015   benign  . EYE SURGERY    . TONSILLECTOMY      Family History  Problem Relation Age of Onset  . Breast cancer Neg Hx    Social History:  reports that she has never smoked. She has never used smokeless tobacco. She reports that she does not drink alcohol or use drugs.  Allergies:  Allergies  Allergen Reactions  . Latex Rash  . Penicillins Rash    DID THE REACTION INVOLVE: Swelling of the face/tongue/throat, SOB, or low BP? No Sudden or severe rash/hives, skin peeling, or the inside of the mouth or nose? No Did it require medical treatment? No When did it last happen? If all above answers are "NO", may proceed with cephalosporin use.    Medications Prior to Admission  Medication Sig Dispense Refill  . amLODipine (NORVASC) 5 MG tablet Take 0.5 tablets (2.5 mg total) by mouth daily. 15 tablet 2  . aspirin 81 MG chewable tablet Chew 81 mg by mouth at bedtime.     . citalopram (CELEXA) 10 MG  tablet Take 10 mg by mouth daily.    . furosemide (LASIX) 40 MG tablet Take 1 tablet (40 mg total) by mouth daily. (Patient taking differently: Take 20 mg by mouth daily. ) 30 tablet 3  . gemfibrozil (LOPID) 600 MG tablet Take 600 mg by mouth 2 (two) times daily before a meal.    . hydrALAZINE (APRESOLINE) 25 MG tablet Take 50 mg by mouth in the morning and at bedtime.     Marland Kitchen levothyroxine (SYNTHROID, LEVOTHROID) 50 MCG tablet Take 50 mcg by mouth daily before breakfast.    . LORazepam (ATIVAN) 0.5 MG tablet Take 0.25 mg by mouth at bedtime.     . metoprolol succinate (TOPROL-XL) 25 MG 24 hr tablet Take 12.5 mg by mouth every evening.     . Omega-3 Fatty Acids (FISH OIL) 1000 MG CAPS Take 1 capsule (1,000 mg total) by mouth 2 (two) times daily with a meal.  0  . ondansetron (ZOFRAN) 8 MG tablet Take 1 tablet (8 mg total) by mouth every 8 (eight) hours as needed for nausea or vomiting. 20 tablet 0  . Red Yeast Rice 600 MG TABS Take 600 tablets by mouth 2 (two) times daily.    . sodium bicarbonate 650 MG tablet Take 650 mg by  mouth daily.     . vitamin E 400 UNIT capsule Take 400 Units by mouth daily.    Marland Kitchen neomycin-polymyxin-hydrocortisone (CORTISPORIN) OTIC solution Apply 1 to 2 drops to toe BID after soaking (Patient not taking: Reported on 05/07/2020) 10 mL 0  . polyethylene glycol (MIRALAX / GLYCOLAX) 17 g packet Take 17 g by mouth daily. (Patient not taking: Reported on 05/07/2020) 30 packet 3    Results for orders placed or performed during the hospital encounter of 05/08/20 (from the past 48 hour(s))  Blood culture (routine x 2)     Status: None (Preliminary result)   Collection Time: 05/08/20 12:31 PM   Specimen: BLOOD  Result Value Ref Range   Specimen Description      BLOOD LEFT ANTECUBITAL Performed at [facility], [place] [address], [place], [place] [address]    Special Requests      BOTTLES DRAWN AEROBIC AND ANAEROBIC Blood Culture adequate volume Performed at [facility] [address] [place], [place] [address]    Culture      NO GROWTH < 24 HOURS Performed at [facility] [address] [place], [place] [address]    Report Status PENDING   Blood culture (routine x 2)     Status: None (Preliminary result)   Collection Time: 05/08/20 12:31 PM   Specimen: BLOOD RIGHT FOREARM  Result Value Ref Range   Specimen Description      BLOOD RIGHT FOREARM Performed at [facility] [address], [place], [place] [address]    Special Requests      BOTTLES DRAWN AEROBIC AND ANAEROBIC Blood Culture results may not be optimal due to an excessive volume of blood received in culture bottles Performed at [facility] [address], [place], [place] [address]    Culture  Setup Time      GRAM NEGATIVE RODS AEROBIC BOTTLE ONLY CRITICAL RESULT CALLED TO, READ BACK BY AND VERIFIED WITH: PHARMD N [place] [id number] AT 652 AM BY CM    Culture      NO GROWTH < 24 HOURS Performed at [facility], [place] [address] [place], [place] [address]    Report Status PENDING   CBC with Differential     Status: Abnormal   Collection Time: 05/08/20 12:31 PM  Result Value Ref Range   WBC 13.7 (H) 4.0 - 10.5 K/uL   RBC 3.14 (L) 3.87 - 5.11 MIL/uL   Hemoglobin 10.5 (L) 12.0 - 15.0 g/dL   HCT 32.6 (L) 36.0 - 46.0 %   MCV 103.8 (H) 80.0 - 100.0 fL   MCH 33.4 26.0 - 34.0 pg   MCHC 32.2 30.0 - 36.0 g/dL   RDW 15.3 11.5 - 15.5 %   Platelets 265 150 - 400 K/uL   nRBC 0.0 0.0 - 0.2 %   Neutrophils Relative % 92 %   Neutro Abs 12.6 (H) 1.7 - 7.7 K/uL   Lymphocytes Relative 3 %   Lymphs Abs 0.4 (L) 0.7 - 4.0 K/uL   Monocytes Relative 4 %   Monocytes Absolute 0.6 0.1 - 1.0 K/uL   Eosinophils Relative 0 %   Eosinophils Absolute 0.0 0.0 - 0.5 K/uL   Basophils Relative 0 %   Basophils Absolute 0.0 0.0 - 0.1 K/uL   WBC Morphology TOXIC GRANULATION     Comment: VACUOLATED NEUTROPHILS   Immature Granulocytes 1  %   Abs Immature Granulocytes 0.09 (H) 0.00 -  0.07 K/uL   Acanthocytes PRESENT    Schistocytes PRESENT    Polychromasia PRESENT     Comment: Performed at [facility], [place] [address], [place], [place] [address]  Comprehensive metabolic panel     Status: Abnormal   Collection Time: 05/08/20 12:31 PM  Result Value Ref Range   Sodium 136 135 - 145 mmol/L   Potassium 4.8 3.5 - 5.1 mmol/L   Chloride 103 98 - 111 mmol/L   CO2 17 (L) 22 - 32 mmol/L   Glucose, Bld 78 70 - 99 mg/dL    Comment: Glucose reference range applies only to samples taken after fasting for at least 8 hours.   BUN 73 (H) 8 - 23 mg/dL   Creatinine, Ser 3.89 (H) 0.44 - 1.00 mg/dL   Calcium 8.9 8.9 - 10.3 mg/dL   Total Protein 7.0 6.5 - 8.1 g/dL   Albumin 3.7 3.5 - 5.0 g/dL   AST 208 (H) 15 - 41 U/L   ALT 127 (H) 0 - 44 U/L   Alkaline Phosphatase 111 38 - 126 U/L   Total Bilirubin 1.7 (H) 0.3 - 1.2 mg/dL   GFR calc non Af Amer 10 (L) >60 mL/min   GFR calc Af Amer 11 (L) >60 mL/min   Anion gap 16 (H) 5 - 15    Comment: Performed at [facility], [place] [address], [place], [place] [address]  Lipase, blood     Status: None   Collection Time: 05/08/20 12:31 PM  Result Value Ref Range   Lipase 22 11 - 51 U/L    Comment: Performed at [facility], [place] [address], [place], [place] [address]  Lactic acid, plasma     Status: None   Collection Time: 05/08/20 12:31 PM  Result Value Ref Range   Lactic Acid, Venous 1.9 0.5 - 1.9 mmol/L    Comment: Performed at [facility], [place] [address] [place], [place] [address]  TSH     Status: None   Collection Time: 05/09/20  5:25 AM  Result Value Ref Range   TSH 2.342 0.350 - 4.500 uIU/mL    Comment: Performed by a 3rd Generation assay with a functional sensitivity of <=0.01 uIU/mL. Performed at [facility], [place] [address], [place], [place] [address]   CBC     Status: Abnormal    Collection Time: 05/09/20  5:25 AM  Result Value Ref Range   WBC 8.9 4.0 - 10.5 K/uL   RBC 2.99 (L) 3.87 - 5.11 MIL/uL   Hemoglobin 9.9 (L) 12.0 - 15.0 g/dL   HCT 30.9 (L) 36.0 - 46.0 %   MCV 103.3 (H) 80.0 - 100.0 fL   MCH 33.1 26.0 - 34.0 pg   MCHC 32.0 30.0 - 36.0 g/dL   RDW 15.2 11.5 - 15.5 %   Platelets 230 150 - 400 K/uL   nRBC 0.0 0.0 - 0.2 %    Comment: Performed at [facility], [place] [address] [place],  [address]  Comprehensive metabolic panel     Status: Abnormal   Collection Time: 05/09/20  5:25 AM  Result Value Ref Range   Sodium 137 135 - 145 mmol/L   Potassium 5.0 3.5 - 5.1 mmol/L   Chloride 109 98 - 111 mmol/L   CO2 16 (L) 22 - 32 mmol/L   Glucose, Bld 40 (LL) 70 - 99 mg/dL    Comment: Glucose reference range applies only to samples  taken after fasting for at least 8 hours. CRITICAL RESULT CALLED TO, READ BACK BY AND VERIFIED WITH: TRAN,C @ 0629 ON [id number] BY POTEAT,S    BUN 82 (H) 8 - 23 mg/dL   Creatinine, Ser 4.19 (H) 0.44 - 1.00 mg/dL   Calcium 8.4 (L) 8.9 - 10.3 mg/dL   Total Protein 6.0 (L) 6.5 - 8.1 g/dL   Albumin 3.1 (L) 3.5 - 5.0 g/dL   AST 105 (H) 15 - 41 U/L   ALT 82 (H) 0 - 44 U/L   Alkaline Phosphatase 97 38 - 126 U/L   Total Bilirubin 1.6 (H) 0.3 - 1.2 mg/dL   GFR calc non Af Amer 9 (L) >60 mL/min   GFR calc Af Amer 10 (L) >60 mL/min   Anion gap 12 5 - 15    Comment: Performed at [facility], [place] [address], [place], [place] [address]  Glucose, capillary     Status: Abnormal   Collection Time: 05/09/20  8:33 AM  Result Value Ref Range   Glucose-Capillary 51 (L) 70 - 99 mg/dL    Comment: Glucose reference range applies only to samples taken after fasting for at least 8 hours.  Glucose, capillary     Status: Abnormal   Collection Time: 05/09/20 11:42 AM  Result Value Ref Range   Glucose-Capillary 105 (H) 70 - 99 mg/dL    Comment: Glucose reference range applies only to samples taken after fasting  for at least 8 hours.   CT ABDOMEN PELVIS WO CONTRAST  Result Date: 05/07/2020 CLINICAL DATA:  Nausea, vomiting EXAM: CT ABDOMEN AND PELVIS WITHOUT CONTRAST TECHNIQUE: Multidetector CT imaging of the abdomen and pelvis was performed following the standard protocol without IV contrast. COMPARISON:  Ultrasound 07/22/2018 FINDINGS: Lower chest: Bibasilar atelectasis or scarring. No acute abnormality. Hepatobiliary: Gallbladder is distended. No visible stones. Suspect mild intrahepatic biliary ductal dilatation. Common bile duct appears to be normal caliber. Pancreas: No focal abnormality or ductal dilatation. Spleen: No focal abnormality.  Normal size. Adrenals/Urinary Tract: Renovascular calcifications within the renal hila bilaterally. No urinary tract stones or hydronephrosis. Adrenal glands and urinary bladder unremarkable. Stomach/Bowel: Diffuse colonic diverticulosis. No active diverticulitis. Stomach and small bowel decompressed, unremarkable. Vascular/Lymphatic: Aortic atherosclerosis. No enlarged abdominal or pelvic lymph nodes. Reproductive: Uterus and adnexa unremarkable.  No mass. Other: No free fluid or free air. Musculoskeletal: No acute bony abnormality. IMPRESSION: Diffuse colonic diverticulosis. No inflammatory changes to suggest active diverticulitis. Distention of the gallbladder. There appears to be mild intrahepatic biliary ductal dilatation. No visible stones. This could be further evaluated with right upper quadrant ultrasound if felt clinically indicated. Aortic atherosclerosis. Electronically Signed   By: Rolm Baptise M.D.   On: 05/07/2020 18:34   DG Chest 2 View  Result Date: 05/08/2020 CLINICAL DATA:  Fever. EXAM: CHEST - 2 VIEW COMPARISON:  December 13, 2019. FINDINGS: Stable cardiomegaly. Atherosclerosis of thoracic aorta is noted. No pneumothorax or pleural effusion is noted. No acute pulmonary disease is noted. Bony thorax is unremarkable. IMPRESSION: No active cardiopulmonary  disease. Aortic atherosclerosis. Electronically Signed   By: Marijo Conception M.D.   On: 05/08/2020 12:41   US Abdomen Complete  Result Date: 05/07/2020 CLINICAL DATA:  Upper abdominal pain for 2 days EXAM: ABDOMEN ULTRASOUND COMPLETE COMPARISON:  None. FINDINGS: Gallbladder: Layering hyperdense sludge is seen. No sonographic Murphy sign noted by sonographer. No gallbladder wall thickening. Common bile duct: Diameter: 4 mm Liver: No focal lesion identified. Within normal limits in parenchymal echogenicity.  Portal vein is patent on color Doppler imaging with normal direction of blood flow towards the liver. IVC: No abnormality visualized. Pancreas: Visualized portion unremarkable. Spleen: Size and appearance within normal limits. Right Kidney: Length: 8.3 cm. There is increased echogenicity seen throughout the right kidney. Trace free fluid seen around the right kidney. Left Kidney: Length: 7.8 cm. Echogenicity within normal limits. There is a complex anechoic cyst seen within the lower pole measuring 1.4 x 1.3 x 1.3 cm. Abdominal aorta: Limited due to overlying bowel gas Other findings: None. IMPRESSION: Layering hyperdense sludge.  No evidence of acute cholecystitis. Electronically Signed   By: Prudencio Pair M.D.   On: 05/07/2020 23:05   MR ABDOMEN MRCP WO CONTRAST  Result Date: 05/08/2020 CLINICAL DATA:  86 year old female with history of acute nonlocalized abdominal pain. Emesis. EXAM: MRI ABDOMEN WITHOUT CONTRAST  (INCLUDING MRCP) TECHNIQUE: Multiplanar multisequence MR imaging of the abdomen was performed. Heavily T2-weighted images of the biliary and pancreatic ducts were obtained, and three-dimensional MRCP images were rendered by post processing. COMPARISON:  No prior abdominal MRI. Abdominal ultrasound 05/08/2020. FINDINGS: Comment: Today's study is limited for detection and characterization of visceral and/or vascular lesions by lack of IV gadolinium. Lower chest: Trace left pleural effusion.  Hepatobiliary: Mild heterogeneous loss of signal intensity throughout the hepatic parenchyma on in phase dual echo images, suggesting heterogeneous hepatic iron deposition. This correlates to diffuse T2 hypointensity throughout the hepatic parenchyma. No discrete cystic or solid hepatic lesions are confidently identified on today's noncontrast examination. MRCP images demonstrate mild dilatation of the common bile duct which measures up to 9 mm in the porta hepatis, which could be age related. Notably, in the common hepatic duct there is a focal contour abnormality best appreciated on MRCP image 30 of series 12, which is poorly evaluated on today's noncontrast examination, but favored to be related to extrinsic compression from an adjacent vascular structure such as the common hepatic artery. No filling defect otherwise identified within the lumen of the common bile duct to suggest choledocholithiasis. There is some amorphous dependent material in the gallbladder suggesting biliary sludge. Gallbladder is moderately distended. No gallbladder wall thickening or pericholecystic fluid. Pancreas: No pancreatic mass or peripancreatic fluid collections or inflammatory changes noted on today's noncontrast examination. MRCP images demonstrate no pancreatic ductal dilatation. Spleen:  Unremarkable. Adrenals/Urinary Tract: In the lower pole of the left kidney there is a 1.3 cm T1 hypointense and T2 hyperintense lesion which is incompletely characterized on today's non-contrast examination, but favored to represent a cyst. Right kidney and bilateral adrenal glands are unremarkable in appearance. No hydroureteronephrosis in the visualized portions of the abdomen. Stomach/Bowel: Visualized portions are unremarkable. Vascular/Lymphatic: Aortic atherosclerosis, without evidence of aneurysm in the abdominal vasculature. Other: No significant volume of ascites noted in the visualized portions of the peritoneal cavity.  Musculoskeletal: No aggressive appearing osseous lesions are noted in the visualized portions of the skeleton. IMPRESSION: 1. Evidence of biliary sludge in the gallbladder, without evidence of acute cholecystitis. 2. Common bile duct is mildly dilated measuring 9 mm in the porta hepatis, however, this is likely within normal limits given the patient's advanced age. No choledocholithiasis. Additionally, there is no evidence of intrahepatic biliary ductal dilatation to suggest significant biliary tract obstruction. 3. Findings are suggestive of heterogeneous iron deposition in the liver, as discussed above. 4. Eccentric filling defect in the common hepatic duct which is favored to be related to extrinsic compression from an adjacent vascular structure, but is poorly evaluated on today's  noncontrast examination. This could be more definitively evaluated with repeat abdominal MRI with and without IV gadolinium with MRCP if of clinical concern. 5. Aortic atherosclerosis. 6. Additional incidental findings, as above. Electronically Signed   By: Vinnie Langton M.D.   On: 05/08/2020 20:44   MR 3D Recon At Scanner  Result Date: 05/08/2020 CLINICAL DATA:  86 year old female with history of acute nonlocalized abdominal pain. Emesis. EXAM: MRI ABDOMEN WITHOUT CONTRAST  (INCLUDING MRCP) TECHNIQUE: Multiplanar multisequence MR imaging of the abdomen was performed. Heavily T2-weighted images of the biliary and pancreatic ducts were obtained, and three-dimensional MRCP images were rendered by post processing. COMPARISON:  No prior abdominal MRI. Abdominal ultrasound 05/08/2020. FINDINGS: Comment: Today's study is limited for detection and characterization of visceral and/or vascular lesions by lack of IV gadolinium. Lower chest: Trace left pleural effusion. Hepatobiliary: Mild heterogeneous loss of signal intensity throughout the hepatic parenchyma on in phase dual echo images, suggesting heterogeneous hepatic iron deposition.  This correlates to diffuse T2 hypointensity throughout the hepatic parenchyma. No discrete cystic or solid hepatic lesions are confidently identified on today's noncontrast examination. MRCP images demonstrate mild dilatation of the common bile duct which measures up to 9 mm in the porta hepatis, which could be age related. Notably, in the common hepatic duct there is a focal contour abnormality best appreciated on MRCP image 30 of series 12, which is poorly evaluated on today's noncontrast examination, but favored to be related to extrinsic compression from an adjacent vascular structure such as the common hepatic artery. No filling defect otherwise identified within the lumen of the common bile duct to suggest choledocholithiasis. There is some amorphous dependent material in the gallbladder suggesting biliary sludge. Gallbladder is moderately distended. No gallbladder wall thickening or pericholecystic fluid. Pancreas: No pancreatic mass or peripancreatic fluid collections or inflammatory changes noted on today's noncontrast examination. MRCP images demonstrate no pancreatic ductal dilatation. Spleen:  Unremarkable. Adrenals/Urinary Tract: In the lower pole of the left kidney there is a 1.3 cm T1 hypointense and T2 hyperintense lesion which is incompletely characterized on today's non-contrast examination, but favored to represent a cyst. Right kidney and bilateral adrenal glands are unremarkable in appearance. No hydroureteronephrosis in the visualized portions of the abdomen. Stomach/Bowel: Visualized portions are unremarkable. Vascular/Lymphatic: Aortic atherosclerosis, without evidence of aneurysm in the abdominal vasculature. Other: No significant volume of ascites noted in the visualized portions of the peritoneal cavity. Musculoskeletal: No aggressive appearing osseous lesions are noted in the visualized portions of the skeleton. IMPRESSION: 1. Evidence of biliary sludge in the gallbladder, without  evidence of acute cholecystitis. 2. Common bile duct is mildly dilated measuring 9 mm in the porta hepatis, however, this is likely within normal limits given the patient's advanced age. No choledocholithiasis. Additionally, there is no evidence of intrahepatic biliary ductal dilatation to suggest significant biliary tract obstruction. 3. Findings are suggestive of heterogeneous iron deposition in the liver, as discussed above. 4. Eccentric filling defect in the common hepatic duct which is favored to be related to extrinsic compression from an adjacent vascular structure, but is poorly evaluated on today's noncontrast examination. This could be more definitively evaluated with repeat abdominal MRI with and without IV gadolinium with MRCP if of clinical concern. 5. Aortic atherosclerosis. 6. Additional incidental findings, as above. Electronically Signed   By: Vinnie Langton M.D.   On: 05/08/2020 20:44   US Abdomen Limited RUQ  Result Date: 05/08/2020 CLINICAL DATA:  Elevated LFTs, positive blood cultures, sludge seen on yesterday exam, continued  RIGHT upper quadrant pain question acute cholecystitis EXAM: ULTRASOUND ABDOMEN LIMITED RIGHT UPPER QUADRANT COMPARISON:  05/07/2020 FINDINGS: Gallbladder: Significant sludge in gallbladder. No definite shadowing calculi. Minimal gallbladder wall thickening. No pericholecystic fluid or sonographic Murphy sign. Common bile duct: Diameter: 9 mm, increased from 4 mm yesterday Liver: Normal echogenicity without mass or nodularity. Mild central intrahepatic biliary dilatation new since previous exam. Portal vein is patent on color Doppler imaging with normal direction of blood flow towards the liver. Other: No RIGHT upper quadrant free fluid. IMPRESSION: Sludge within gallbladder with minimal gallbladder wall thickening but no definite calculi visualized. Interval increase in diameter of CBD now 9 mm (4 mm yesterday) with associated mild intrahepatic biliary dilatation  centrally; CBD obstruction not excluded and in the setting of elevated LFTs, consider MRCP imaging. Electronically Signed   By: Lavonia Dana M.D.   On: 05/08/2020 16:30    Review Of Systems Constitutional: No fever, chills, weight loss or gain. Eyes: No vision change, wears glasses. No discharge or pain. Ears: No hearing loss, No tinnitus. Respiratory: No asthma, COPD, pneumonias. Positive shortness of breath. No hemoptysis. Cardiovascular: No chest pain, palpitation, leg edema. Gastrointestinal: Positive nausea, vomiting, no diarrhea, constipation. No GI bleed. No hepatitis. Genitourinary: No dysuria, hematuria, kidney stone. No incontinance. Neurological: No headache, stroke, seizures.  Psychiatry: No psych facility admission for anxiety, depression, suicide. No detox. Skin: No rash. Musculoskeletal: Positive joint pain, no fibromyalgia. No neck pain, back pain. Lymphadenopathy: No lymphadenopathy. Hematology: Positive anemia, No easy bruising.   Blood pressure (!) 170/68, pulse 72, temperature 97.6 F (36.4 C), temperature source Oral, resp. rate 16, height 5\' 1"  (1.549 m), weight 49.9 kg, SpO2 96 %. Body mass index is 20.78 kg/m. General appearance: alert, cooperative, appears stated age and mild respiratory distress Head: Normocephalic, atraumatic. Eyes: Blue eyes, pink conjunctiva, corneas clear. PERRL, EOM's intact. Neck: No adenopathy, no carotid bruit, no JVD, supple, symmetrical, trachea midline and thyroid not enlarged. Resp: Clear to auscultation bilaterally. Cardio: Regular rate and rhythm, S1, S2 normal, II/VI systolic murmur, no click, rub or gallop GI: Soft, epigastric area tender; bowel sounds normal; no organomegaly. Extremities: Trace edema, no cyanosis or clubbing. Skin: Warm and dry.  Neurologic: Alert and oriented X 3, normal strength. Normal coordination..  Assessment/Plan Acute E.Coli and Klebsiella sepsis Chronic systolic and diastolic left heart failure,  HFrEF HTN Hypothyroidism CKD, IV Anemia of chronic kidney disease Dehydration  Echocardiogram for LV function. High risk for surgical intervention. Medical treatment as much as possible. If LV function stable or improving, may undergo surgery.  Time spent: Review of old records, Lab, x-rays, EKG, other cardiac tests, examination, discussion with patient, family and Hospitalist over 70 minutes.  Birdie Riddle, MD  05/09/2020, 12:45 PM

## 2020-05-09 NOTE — Consult Note (Signed)
[facility] Surgery Consult Note  Alicia Sellers [birth year]  [id number].    Requesting MD: Dr. Cristina Gong Chief Complaint/Reason for Consult: gallbladder sludge HPI:  Patient is an 86 year old female who presented to the ED with abdominal pain and nausea/vomiting. She was initially seen in the ED 6/1 and found to have a mildly distended gallbladder with sludge and neutropenia. Her symptoms improved in the ED and she was sent home. She had blood cultures drawn on 6/1 that resulted positive for E.Coli and she was instructed to come back to the hospital. Reportedly she had some diarrhea at home with initial symptoms and pain was in her upper abdomen. She currently has no abdominal pain, nausea, vomiting, fever, chills, chest pain, SOB, urinary symptoms. PMH significant for CHF, CKD stage IV, hypothyroidism, HTN, HLD, and anemia of chronic disease. Past abdominal surgery includes appendectomy. Patient is not on any blood thinners. Denied alcohol, tobacco and drug use.   ROS: Review of Systems  Constitutional: Negative for chills and fever.  Respiratory: Negative for shortness of breath and wheezing.   Cardiovascular: Negative for chest pain and palpitations.  Gastrointestinal: Negative for abdominal pain, nausea and vomiting.  Genitourinary: Negative for dysuria, frequency and urgency.  All other systems reviewed and are negative.   Family History  Problem Relation Age of Onset  . Breast cancer Neg Hx     Past Medical History:  Diagnosis Date  . Acute on chronic left systolic heart failure ([place]) 10/26/2019  . Anemia   . Anxiety   . Arthritis   . Chronic kidney disease   . Dyspnea   . Headache   . Hypertension   . Hypothyroidism     Past Surgical History:  Procedure Laterality Date  . APPENDECTOMY    . BREAST BIOPSY Left 2015   benign  . EYE SURGERY    . TONSILLECTOMY      Social History:  reports that she has never smoked. She has never used smokeless tobacco. She  reports that she does not drink alcohol or use drugs.  Allergies:  Allergies  Allergen Reactions  . Latex Rash  . Penicillins Rash    DID THE REACTION INVOLVE: Swelling of the face/tongue/throat, SOB, or low BP? No Sudden or severe rash/hives, skin peeling, or the inside of the mouth or nose? No Did it require medical treatment? No When did it last happen? If all above answers are "NO", may proceed with cephalosporin use.    Medications Prior to Admission  Medication Sig Dispense Refill  . amLODipine (NORVASC) 5 MG tablet Take 0.5 tablets (2.5 mg total) by mouth daily. 15 tablet 2  . aspirin 81 MG chewable tablet Chew 81 mg by mouth at bedtime.     . citalopram (CELEXA) 10 MG tablet Take 10 mg by mouth daily.    . furosemide (LASIX) 40 MG tablet Take 1 tablet (40 mg total) by mouth daily. (Patient taking differently: Take 20 mg by mouth daily. ) 30 tablet 3  . gemfibrozil (LOPID) 600 MG tablet Take 600 mg by mouth 2 (two) times daily before a meal.    . hydrALAZINE (APRESOLINE) 25 MG tablet Take 50 mg by mouth in the morning and at bedtime.     Marland Kitchen levothyroxine (SYNTHROID, LEVOTHROID) 50 MCG tablet Take 50 mcg by mouth daily before breakfast.    . LORazepam (ATIVAN) 0.5 MG tablet Take 0.25 mg by mouth at bedtime.     . metoprolol succinate (TOPROL-XL) 25 MG 24 hr  tablet Take 12.5 mg by mouth every evening.     . Omega-3 Fatty Acids (FISH OIL) 1000 MG CAPS Take 1 capsule (1,000 mg total) by mouth 2 (two) times daily with a meal.  0  . ondansetron (ZOFRAN) 8 MG tablet Take 1 tablet (8 mg total) by mouth every 8 (eight) hours as needed for nausea or vomiting. 20 tablet 0  . Red Yeast Rice 600 MG TABS Take 600 tablets by mouth 2 (two) times daily.    . sodium bicarbonate 650 MG tablet Take 650 mg by mouth daily.     . vitamin E 400 UNIT capsule Take 400 Units by mouth daily.    Marland Kitchen neomycin-polymyxin-hydrocortisone (CORTISPORIN) OTIC solution Apply 1 to 2 drops to toe BID after soaking  (Patient not taking: Reported on 05/07/2020) 10 mL 0  . polyethylene glycol (MIRALAX / GLYCOLAX) 17 g packet Take 17 g by mouth daily. (Patient not taking: Reported on 05/07/2020) 30 packet 3    Blood pressure (!) 156/54, pulse 79, temperature 98.6 F (37 C), temperature source Oral, resp. rate 16, height 5\' 1"  (1.549 m), weight 49.9 kg, SpO2 96 %. Physical Exam:  General: pleasant, WD, thin white female who is laying in bed in NAD HEENT:  Sclera are anicteric.  PERRL.  Ears and nose without any masses or lesions.  Mouth is pink and moist Heart: regular, rate, and rhythm.  Normal s1,s2. No obvious murmurs, gallops, or rubs noted.  Palpable radial and pedal pulses bilaterally Lungs: CTAB, no wheezes, rhonchi, or rales noted.  Respiratory effort nonlabored Abd: soft, mildly ttp in suprapubic abdomen, no ttp in RUQ or epigastrium, ND, +BS, no masses, hernias, or organomegaly MS: all 4 extremities are symmetrical with no cyanosis, clubbing, or edema. Skin: warm and dry with no masses, lesions, or rashes; no jaundice Neuro: Cranial nerves 2-12 grossly intact, sensation grossly intact throughout Psych: A&Ox3 with an appropriate affect.   Results for orders placed or performed during the hospital encounter of 05/08/20 (from the past 48 hour(s))  Blood culture (routine x 2)     Status: None (Preliminary result)   Collection Time: 05/08/20 12:31 PM   Specimen: BLOOD RIGHT FOREARM  Result Value Ref Range   Specimen Description      BLOOD RIGHT FOREARM Performed at [facility] [place] [address], [place], [place] [address]    Special Requests      BOTTLES DRAWN AEROBIC AND ANAEROBIC Blood Culture results may not be optimal due to an excessive volume of blood received in culture bottles Performed at [facility] [address], [place], [place] [address]    Culture  Setup Time      GRAM NEGATIVE RODS AEROBIC BOTTLE ONLY CRITICAL RESULT CALLED TO, READ BACK BY AND  VERIFIED WITH: PHARMD N [place] [id number] AT 652 AM BY CM Performed at [facility], [place] [address], [place], [place] [address]    Culture PENDING    Report Status PENDING   CBC with Differential     Status: Abnormal   Collection Time: 05/08/20 12:31 PM  Result Value Ref Range   WBC 13.7 (H) 4.0 - 10.5 K/uL   RBC 3.14 (L) 3.87 - 5.11 MIL/uL   Hemoglobin 10.5 (L) 12.0 - 15.0 g/dL   HCT 32.6 (L) 36.0 - 46.0 %   MCV 103.8 (H) 80.0 - 100.0 fL   MCH 33.4 26.0 - 34.0 pg   MCHC 32.2 30.0 - 36.0 g/dL  RDW 15.3 11.5 - 15.5 %   Platelets 265 150 - 400 K/uL   nRBC 0.0 0.0 - 0.2 %   Neutrophils Relative % 92 %   Neutro Abs 12.6 (H) 1.7 - 7.7 K/uL   Lymphocytes Relative 3 %   Lymphs Abs 0.4 (L) 0.7 - 4.0 K/uL   Monocytes Relative 4 %   Monocytes Absolute 0.6 0.1 - 1.0 K/uL   Eosinophils Relative 0 %   Eosinophils Absolute 0.0 0.0 - 0.5 K/uL   Basophils Relative 0 %   Basophils Absolute 0.0 0.0 - 0.1 K/uL   WBC Morphology TOXIC GRANULATION     Comment: VACUOLATED NEUTROPHILS   Immature Granulocytes 1 %   Abs Immature Granulocytes 0.09 (H) 0.00 - 0.07 K/uL   Acanthocytes PRESENT    Schistocytes PRESENT    Polychromasia PRESENT     Comment: Performed at [facility], [place] [address] [place], [place] [address]  Comprehensive metabolic panel     Status: Abnormal   Collection Time: 05/08/20 12:31 PM  Result Value Ref Range   Sodium 136 135 - 145 mmol/L   Potassium 4.8 3.5 - 5.1 mmol/L   Chloride 103 98 - 111 mmol/L   CO2 17 (L) 22 - 32 mmol/L   Glucose, Bld 78 70 - 99 mg/dL    Comment: Glucose reference range applies only to samples taken after fasting for at least 8 hours.   BUN 73 (H) 8 - 23 mg/dL   Creatinine, Ser 3.89 (H) 0.44 - 1.00 mg/dL   Calcium 8.9 8.9 - 10.3 mg/dL   Total Protein 7.0 6.5 - 8.1 g/dL   Albumin 3.7 3.5 - 5.0 g/dL   AST 208 (H) 15 - 41 U/L   ALT 127 (H) 0 - 44 U/L   Alkaline Phosphatase 111 38 - 126 U/L   Total Bilirubin 1.7 (H) 0.3 -  1.2 mg/dL   GFR calc non Af Amer 10 (L) >60 mL/min   GFR calc Af Amer 11 (L) >60 mL/min   Anion gap 16 (H) 5 - 15    Comment: Performed at [facility], [place] [address] [place], [place] [address]  Lipase, blood     Status: None   Collection Time: 05/08/20 12:31 PM  Result Value Ref Range   Lipase 22 11 - 51 U/L    Comment: Performed at [facility], [place] [address], [place], [place] [address]  Lactic acid, plasma     Status: None   Collection Time: 05/08/20 12:31 PM  Result Value Ref Range   Lactic Acid, Venous 1.9 0.5 - 1.9 mmol/L    Comment: Performed at [facility], [place] [address], [place], [place] [address]  TSH     Status: None   Collection Time: 05/09/20  5:25 AM  Result Value Ref Range   TSH 2.342 0.350 - 4.500 uIU/mL    Comment: Performed by a 3rd Generation assay with a functional sensitivity of <=0.01 uIU/mL. Performed at [facility], [place] [address], [place],  [address]   CBC     Status: Abnormal   Collection Time: 05/09/20  5:25 AM  Result Value Ref Range   WBC 8.9 4.0 - 10.5 K/uL   RBC 2.99 (L) 3.87 - 5.11 MIL/uL   Hemoglobin 9.9 (L) 12.0 - 15.0 g/dL   HCT 30.9 (L) 36.0 - 46.0 %   MCV 103.3 (H) 80.0 - 100.0 fL   MCH 33.1 26.0 -  34.0 pg   MCHC 32.0 30.0 - 36.0 g/dL   RDW 15.2 11.5 - 15.5 %   Platelets 230 150 - 400 K/uL   nRBC 0.0 0.0 - 0.2 %    Comment: Performed at [facility], [place] [address] [place], [place] [address]  Comprehensive metabolic panel     Status: Abnormal   Collection Time: 05/09/20  5:25 AM  Result Value Ref Range   Sodium 137 135 - 145 mmol/L   Potassium 5.0 3.5 - 5.1 mmol/L   Chloride 109 98 - 111 mmol/L   CO2 16 (L) 22 - 32 mmol/L   Glucose, Bld 40 (LL) 70 - 99 mg/dL    Comment: Glucose reference range applies only to samples taken after fasting for at least 8 hours. CRITICAL RESULT CALLED TO, READ BACK BY AND VERIFIED WITH: TRAN,C @  0629 ON [id number] BY POTEAT,S    BUN 82 (H) 8 - 23 mg/dL   Creatinine, Ser 4.19 (H) 0.44 - 1.00 mg/dL   Calcium 8.4 (L) 8.9 - 10.3 mg/dL   Total Protein 6.0 (L) 6.5 - 8.1 g/dL   Albumin 3.1 (L) 3.5 - 5.0 g/dL   AST 105 (H) 15 - 41 U/L   ALT 82 (H) 0 - 44 U/L   Alkaline Phosphatase 97 38 - 126 U/L   Total Bilirubin 1.6 (H) 0.3 - 1.2 mg/dL   GFR calc non Af Amer 9 (L) >60 mL/min   GFR calc Af Amer 10 (L) >60 mL/min   Anion gap 12 5 - 15    Comment: Performed at [facility], [place] [address] [place], [place] [address]  Glucose, capillary     Status: Abnormal   Collection Time: 05/09/20  8:33 AM  Result Value Ref Range   Glucose-Capillary 51 (L) 70 - 99 mg/dL    Comment: Glucose reference range applies only to samples taken after fasting for at least 8 hours.   CT ABDOMEN PELVIS WO CONTRAST  Result Date: 05/07/2020 CLINICAL DATA:  Nausea, vomiting EXAM: CT ABDOMEN AND PELVIS WITHOUT CONTRAST TECHNIQUE: Multidetector CT imaging of the abdomen and pelvis was performed following the standard protocol without IV contrast. COMPARISON:  Ultrasound 07/22/2018 FINDINGS: Lower chest: Bibasilar atelectasis or scarring. No acute abnormality. Hepatobiliary: Gallbladder is distended. No visible stones. Suspect mild intrahepatic biliary ductal dilatation. Common bile duct appears to be normal caliber. Pancreas: No focal abnormality or ductal dilatation. Spleen: No focal abnormality.  Normal size. Adrenals/Urinary Tract: Renovascular calcifications within the renal hila bilaterally. No urinary tract stones or hydronephrosis. Adrenal glands and urinary bladder unremarkable. Stomach/Bowel: Diffuse colonic diverticulosis. No active diverticulitis. Stomach and small bowel decompressed, unremarkable. Vascular/Lymphatic: Aortic atherosclerosis. No enlarged abdominal or pelvic lymph nodes. Reproductive: Uterus and adnexa unremarkable.  No mass. Other: No free fluid or free air. Musculoskeletal: No acute  bony abnormality. IMPRESSION: Diffuse colonic diverticulosis. No inflammatory changes to suggest active diverticulitis. Distention of the gallbladder. There appears to be mild intrahepatic biliary ductal dilatation. No visible stones. This could be further evaluated with right upper quadrant ultrasound if felt clinically indicated. Aortic atherosclerosis. Electronically Signed   By: Rolm Baptise M.D.   On: 05/07/2020 18:34   DG Chest 2 View  Result Date: 05/08/2020 CLINICAL DATA:  Fever. EXAM: CHEST - 2 VIEW COMPARISON:  December 13, 2019. FINDINGS: Stable cardiomegaly. Atherosclerosis of thoracic aorta is noted. No pneumothorax or pleural effusion is noted. No acute pulmonary disease is noted. Bony thorax is unremarkable. IMPRESSION: No active  cardiopulmonary disease. Aortic atherosclerosis. Electronically Signed   By: Marijo Conception M.D.   On: 05/08/2020 12:41   US Abdomen Complete  Result Date: 05/07/2020 CLINICAL DATA:  Upper abdominal pain for 2 days EXAM: ABDOMEN ULTRASOUND COMPLETE COMPARISON:  None. FINDINGS: Gallbladder: Layering hyperdense sludge is seen. No sonographic Murphy sign noted by sonographer. No gallbladder wall thickening. Common bile duct: Diameter: 4 mm Liver: No focal lesion identified. Within normal limits in parenchymal echogenicity. Portal vein is patent on color Doppler imaging with normal direction of blood flow towards the liver. IVC: No abnormality visualized. Pancreas: Visualized portion unremarkable. Spleen: Size and appearance within normal limits. Right Kidney: Length: 8.3 cm. There is increased echogenicity seen throughout the right kidney. Trace free fluid seen around the right kidney. Left Kidney: Length: 7.8 cm. Echogenicity within normal limits. There is a complex anechoic cyst seen within the lower pole measuring 1.4 x 1.3 x 1.3 cm. Abdominal aorta: Limited due to overlying bowel gas Other findings: None. IMPRESSION: Layering hyperdense sludge.  No evidence of acute  cholecystitis. Electronically Signed   By: Prudencio Pair M.D.   On: 05/07/2020 23:05   MR ABDOMEN MRCP WO CONTRAST  Result Date: 05/08/2020 CLINICAL DATA:  86 year old female with history of acute nonlocalized abdominal pain. Emesis. EXAM: MRI ABDOMEN WITHOUT CONTRAST  (INCLUDING MRCP) TECHNIQUE: Multiplanar multisequence MR imaging of the abdomen was performed. Heavily T2-weighted images of the biliary and pancreatic ducts were obtained, and three-dimensional MRCP images were rendered by post processing. COMPARISON:  No prior abdominal MRI. Abdominal ultrasound 05/08/2020. FINDINGS: Comment: Today's study is limited for detection and characterization of visceral and/or vascular lesions by lack of IV gadolinium. Lower chest: Trace left pleural effusion. Hepatobiliary: Mild heterogeneous loss of signal intensity throughout the hepatic parenchyma on in phase dual echo images, suggesting heterogeneous hepatic iron deposition. This correlates to diffuse T2 hypointensity throughout the hepatic parenchyma. No discrete cystic or solid hepatic lesions are confidently identified on today's noncontrast examination. MRCP images demonstrate mild dilatation of the common bile duct which measures up to 9 mm in the porta hepatis, which could be age related. Notably, in the common hepatic duct there is a focal contour abnormality best appreciated on MRCP image 30 of series 12, which is poorly evaluated on today's noncontrast examination, but favored to be related to extrinsic compression from an adjacent vascular structure such as the common hepatic artery. No filling defect otherwise identified within the lumen of the common bile duct to suggest choledocholithiasis. There is some amorphous dependent material in the gallbladder suggesting biliary sludge. Gallbladder is moderately distended. No gallbladder wall thickening or pericholecystic fluid. Pancreas: No pancreatic mass or peripancreatic fluid collections or inflammatory  changes noted on today's noncontrast examination. MRCP images demonstrate no pancreatic ductal dilatation. Spleen:  Unremarkable. Adrenals/Urinary Tract: In the lower pole of the left kidney there is a 1.3 cm T1 hypointense and T2 hyperintense lesion which is incompletely characterized on today's non-contrast examination, but favored to represent a cyst. Right kidney and bilateral adrenal glands are unremarkable in appearance. No hydroureteronephrosis in the visualized portions of the abdomen. Stomach/Bowel: Visualized portions are unremarkable. Vascular/Lymphatic: Aortic atherosclerosis, without evidence of aneurysm in the abdominal vasculature. Other: No significant volume of ascites noted in the visualized portions of the peritoneal cavity. Musculoskeletal: No aggressive appearing osseous lesions are noted in the visualized portions of the skeleton. IMPRESSION: 1. Evidence of biliary sludge in the gallbladder, without evidence of acute cholecystitis. 2. Common bile duct is mildly dilated measuring  9 mm in the porta hepatis, however, this is likely within normal limits given the patient's advanced age. No choledocholithiasis. Additionally, there is no evidence of intrahepatic biliary ductal dilatation to suggest significant biliary tract obstruction. 3. Findings are suggestive of heterogeneous iron deposition in the liver, as discussed above. 4. Eccentric filling defect in the common hepatic duct which is favored to be related to extrinsic compression from an adjacent vascular structure, but is poorly evaluated on today's noncontrast examination. This could be more definitively evaluated with repeat abdominal MRI with and without IV gadolinium with MRCP if of clinical concern. 5. Aortic atherosclerosis. 6. Additional incidental findings, as above. Electronically Signed   By: Vinnie Langton M.D.   On: 05/08/2020 20:44   MR 3D Recon At Scanner  Result Date: 05/08/2020 CLINICAL DATA:  86 year old female with  history of acute nonlocalized abdominal pain. Emesis. EXAM: MRI ABDOMEN WITHOUT CONTRAST  (INCLUDING MRCP) TECHNIQUE: Multiplanar multisequence MR imaging of the abdomen was performed. Heavily T2-weighted images of the biliary and pancreatic ducts were obtained, and three-dimensional MRCP images were rendered by post processing. COMPARISON:  No prior abdominal MRI. Abdominal ultrasound 05/08/2020. FINDINGS: Comment: Today's study is limited for detection and characterization of visceral and/or vascular lesions by lack of IV gadolinium. Lower chest: Trace left pleural effusion. Hepatobiliary: Mild heterogeneous loss of signal intensity throughout the hepatic parenchyma on in phase dual echo images, suggesting heterogeneous hepatic iron deposition. This correlates to diffuse T2 hypointensity throughout the hepatic parenchyma. No discrete cystic or solid hepatic lesions are confidently identified on today's noncontrast examination. MRCP images demonstrate mild dilatation of the common bile duct which measures up to 9 mm in the porta hepatis, which could be age related. Notably, in the common hepatic duct there is a focal contour abnormality best appreciated on MRCP image 30 of series 12, which is poorly evaluated on today's noncontrast examination, but favored to be related to extrinsic compression from an adjacent vascular structure such as the common hepatic artery. No filling defect otherwise identified within the lumen of the common bile duct to suggest choledocholithiasis. There is some amorphous dependent material in the gallbladder suggesting biliary sludge. Gallbladder is moderately distended. No gallbladder wall thickening or pericholecystic fluid. Pancreas: No pancreatic mass or peripancreatic fluid collections or inflammatory changes noted on today's noncontrast examination. MRCP images demonstrate no pancreatic ductal dilatation. Spleen:  Unremarkable. Adrenals/Urinary Tract: In the lower pole of the left  kidney there is a 1.3 cm T1 hypointense and T2 hyperintense lesion which is incompletely characterized on today's non-contrast examination, but favored to represent a cyst. Right kidney and bilateral adrenal glands are unremarkable in appearance. No hydroureteronephrosis in the visualized portions of the abdomen. Stomach/Bowel: Visualized portions are unremarkable. Vascular/Lymphatic: Aortic atherosclerosis, without evidence of aneurysm in the abdominal vasculature. Other: No significant volume of ascites noted in the visualized portions of the peritoneal cavity. Musculoskeletal: No aggressive appearing osseous lesions are noted in the visualized portions of the skeleton. IMPRESSION: 1. Evidence of biliary sludge in the gallbladder, without evidence of acute cholecystitis. 2. Common bile duct is mildly dilated measuring 9 mm in the porta hepatis, however, this is likely within normal limits given the patient's advanced age. No choledocholithiasis. Additionally, there is no evidence of intrahepatic biliary ductal dilatation to suggest significant biliary tract obstruction. 3. Findings are suggestive of heterogeneous iron deposition in the liver, as discussed above. 4. Eccentric filling defect in the common hepatic duct which is favored to be related to extrinsic compression from an  adjacent vascular structure, but is poorly evaluated on today's noncontrast examination. This could be more definitively evaluated with repeat abdominal MRI with and without IV gadolinium with MRCP if of clinical concern. 5. Aortic atherosclerosis. 6. Additional incidental findings, as above. Electronically Signed   By: Vinnie Langton M.D.   On: 05/08/2020 20:44   US Abdomen Limited RUQ  Result Date: 05/08/2020 CLINICAL DATA:  Elevated LFTs, positive blood cultures, sludge seen on yesterday exam, continued RIGHT upper quadrant pain question acute cholecystitis EXAM: ULTRASOUND ABDOMEN LIMITED RIGHT UPPER QUADRANT COMPARISON:   05/07/2020 FINDINGS: Gallbladder: Significant sludge in gallbladder. No definite shadowing calculi. Minimal gallbladder wall thickening. No pericholecystic fluid or sonographic Murphy sign. Common bile duct: Diameter: 9 mm, increased from 4 mm yesterday Liver: Normal echogenicity without mass or nodularity. Mild central intrahepatic biliary dilatation new since previous exam. Portal vein is patent on color Doppler imaging with normal direction of blood flow towards the liver. Other: No RIGHT upper quadrant free fluid. IMPRESSION: Sludge within gallbladder with minimal gallbladder wall thickening but no definite calculi visualized. Interval increase in diameter of CBD now 9 mm (4 mm yesterday) with associated mild intrahepatic biliary dilatation centrally; CBD obstruction not excluded and in the setting of elevated LFTs, consider MRCP imaging. Electronically Signed   By: Lavonia Dana M.D.   On: 05/08/2020 16:30      Assessment/Plan HTN Combined systolic and diastolic CHF - last ECHO [id number]/5462 with EF 20-25% Hypothyroidism CKD stage IV Anemia of chronic disease Hypoglycemia - having low blood glucose this AM, primary attending notified by RN  Sepsis due to E.coli, Klebsiella, and Enterobacter bactermia ?Cholangitis with transaminitis - LFTs were trending up yesterday from admission but are trending back down this AM - WBC 8.9 and patient is afebrile - [place] yesterday with minimal GB wall thickening, no pericholecystitis fluid, gallbladder sludge - MRCP yesterday with sludge but no evidence of cholecystitis, dilated CBD but no choledocholithiasis, and heterogenous iron deposition in the liver - also noted on MRCP - filling defect in the common hepatic duct which is favored to be related to extrinsic compression from an adjacent vascular structure - discussed with Dr. Cristina Gong, I do not think this patient is a good surgical candidate with cardiac issues and hypoglycemia. At this time I would recommend  ERCP with attempt to evaluate cystic duct patency vs HIDA scan if ERCP not able to be done today. If the HIDA is positive, then I would recommend percutaneous cholecystostomy by IR  FEN: NPO, IVF VTE: SQ heparin ID: [id number]>>  Norm Parcel, [facility] Surgery 05/09/2020, 10:21 AM Please see Amion for pager number during day hours 7:00am-4:30pm

## 2020-05-09 NOTE — Progress Notes (Signed)
Surgeons' input appreciated.    Labs improved overnight, MRI negative for any definite intraductal stone.  Meanwhile, HIDA scan came back negative.  Awaiting updated echo.  Patient free of pain and feeling hungry.  Plan  1.  Trial of low-fat diet  2.  Monitor patient clinically and biochemically  3.  Depending on patient's clinical evolution, we will have to do a risk analysis: Is the patient better off with the risk of an ERCP, taking into account cardiomyopathy and advanced age as risk factors for anesthesia, not to mention the risk of the ERCP itself; or, is the patient better off with definitive clearance of the common duct, having presented with polymicrobial sepsis most likely attributable to the biliary tract.  At the moment, it appears the patient may have passed a stone since her MRCP was negative and LFTs are improving, so I think a good case could be made for observation rather than ERCP intervention, especially if she continues to do well and tolerates a diet.  Cleotis Nipper, M.D. Pager [phone] If no answer or after 5 PM call [phone]

## 2020-05-09 NOTE — Plan of Care (Signed)

## 2020-05-10 ENCOUNTER — Inpatient Hospital Stay (HOSPITAL_COMMUNITY): Payer: Medicare HMO

## 2020-05-10 ENCOUNTER — Encounter (HOSPITAL_COMMUNITY): Payer: Self-pay | Admitting: Internal Medicine

## 2020-05-10 LAB — CBC
HCT: 29.6 % — ABNORMAL LOW (ref 36.0–46.0)
Hemoglobin: 9.7 g/dL — ABNORMAL LOW (ref 12.0–15.0)
MCH: 33.2 pg (ref 26.0–34.0)
MCHC: 32.8 g/dL (ref 30.0–36.0)
MCV: 101.4 fL — ABNORMAL HIGH (ref 80.0–100.0)
Platelets: 216 10*3/uL (ref 150–400)
RBC: 2.92 MIL/uL — ABNORMAL LOW (ref 3.87–5.11)
RDW: 15.2 % (ref 11.5–15.5)
WBC: 5.6 10*3/uL (ref 4.0–10.5)
nRBC: 0 % (ref 0.0–0.2)

## 2020-05-10 LAB — COMPREHENSIVE METABOLIC PANEL
ALT: 59 U/L — ABNORMAL HIGH (ref 0–44)
AST: 55 U/L — ABNORMAL HIGH (ref 15–41)
Albumin: 2.9 g/dL — ABNORMAL LOW (ref 3.5–5.0)
Alkaline Phosphatase: 93 U/L (ref 38–126)
Anion gap: 12 (ref 5–15)
BUN: 75 mg/dL — ABNORMAL HIGH (ref 8–23)
CO2: 17 mmol/L — ABNORMAL LOW (ref 22–32)
Calcium: 7.9 mg/dL — ABNORMAL LOW (ref 8.9–10.3)
Chloride: 107 mmol/L (ref 98–111)
Creatinine, Ser: 4.03 mg/dL — ABNORMAL HIGH (ref 0.44–1.00)
GFR calc Af Amer: 11 mL/min — ABNORMAL LOW (ref >60)
GFR calc non Af Amer: 9 mL/min — ABNORMAL LOW (ref >60)
Glucose, Bld: 102 mg/dL — ABNORMAL HIGH (ref 70–99)
Potassium: 4.3 mmol/L (ref 3.5–5.1)
Sodium: 136 mmol/L (ref 135–145)
Total Bilirubin: 1.1 mg/dL (ref 0.3–1.2)
Total Protein: 6 g/dL — ABNORMAL LOW (ref 6.5–8.1)

## 2020-05-10 LAB — ECHOCARDIOGRAM COMPLETE
Height: 61 in
Weight: 1760 oz

## 2020-05-10 MED ORDER — LEVOTHYROXINE SODIUM 50 MCG PO TABS
50.0000 ug | ORAL_TABLET | Freq: Every day | ORAL | Status: DC
  Administered 2020-05-10 – 2020-05-11 (×2): 50 ug via ORAL
  Filled 2020-05-10 (×2): qty 1

## 2020-05-10 MED ORDER — SODIUM BICARBONATE 650 MG PO TABS
650.0000 mg | ORAL_TABLET | Freq: Every day | ORAL | Status: DC
  Administered 2020-05-10 – 2020-05-11 (×2): 650 mg via ORAL
  Filled 2020-05-10 (×2): qty 1

## 2020-05-10 MED ORDER — URSODIOL 300 MG PO CAPS
300.0000 mg | ORAL_CAPSULE | Freq: Two times a day (BID) | ORAL | Status: DC
  Administered 2020-05-10 – 2020-05-11 (×3): 300 mg via ORAL
  Filled 2020-05-10 (×4): qty 1

## 2020-05-10 MED ORDER — ACETAMINOPHEN 325 MG PO TABS
650.0000 mg | ORAL_TABLET | ORAL | Status: DC | PRN
  Administered 2020-05-10: 650 mg via ORAL
  Filled 2020-05-10: qty 2

## 2020-05-10 MED ORDER — CITALOPRAM HYDROBROMIDE 20 MG PO TABS
10.0000 mg | ORAL_TABLET | Freq: Every day | ORAL | Status: DC
  Administered 2020-05-10 – 2020-05-11 (×2): 10 mg via ORAL
  Filled 2020-05-10 (×2): qty 1

## 2020-05-10 MED ORDER — ASPIRIN 81 MG PO CHEW
81.0000 mg | CHEWABLE_TABLET | Freq: Every day | ORAL | Status: DC
  Administered 2020-05-10: 81 mg via ORAL
  Filled 2020-05-10: qty 1

## 2020-05-10 MED ORDER — AMLODIPINE BESYLATE 5 MG PO TABS
2.5000 mg | ORAL_TABLET | Freq: Every day | ORAL | Status: DC
  Administered 2020-05-10 – 2020-05-11 (×2): 2.5 mg via ORAL
  Filled 2020-05-10 (×2): qty 1

## 2020-05-10 MED ORDER — HYDRALAZINE HCL 50 MG PO TABS
50.0000 mg | ORAL_TABLET | Freq: Two times a day (BID) | ORAL | Status: DC
  Administered 2020-05-10 – 2020-05-11 (×3): 50 mg via ORAL
  Filled 2020-05-10 (×3): qty 1

## 2020-05-10 MED ORDER — SODIUM CHLORIDE 0.45 % IV SOLN: INTRAVENOUS | Status: DC

## 2020-05-10 NOTE — Progress Notes (Signed)
[facility] Progress Note     Subjective: Patient denies abdominal pain or nausea. Feels well this AM. Discussed with patient HIDA results.   Objective: Vital signs in last 24 hours: Temp:  [97.3 F (36.3 C)-98.4 F (36.9 C)] 97.3 F (36.3 C) (06/04 0432) Pulse Rate:  [60-74] 60 (06/04 0500) Resp:  [14-18] 18 (06/04 0432) BP: (143-187)/(49-78) 171/61 (06/04 0500) SpO2:  [96 %-98 %] 98 % (06/04 0432) Last BM Date: 05/09/20  Intake/Output from previous day: 06/03 0701 - 06/04 0700 In: 1784.3 [I.V.:1385.7; IV Piggyback:398.5] Out: -  Intake/Output this shift: No intake/output data recorded.  PE: General: pleasant, WD, thin white female who is laying in bed in NAD HEENT:  Sclera are anicteric.  PERRL.  Ears and nose without any masses or lesions.  Mouth is pink and moist Heart: regular, rate, and rhythm.  Normal s1,s2. No obvious murmurs, gallops, or rubs noted.  Palpable radial and pedal pulses bilaterally Lungs: CTAB, no wheezes, rhonchi, or rales noted.  Respiratory effort nonlabored Abd: soft, mildly ttp in suprapubic abdomen, no ttp in RUQ or epigastrium, ND, +BS, no masses, hernias, or organomegaly MS: all 4 extremities are symmetrical with no cyanosis, clubbing, or edema. Skin: warm and dry with no masses, lesions, or rashes; no jaundice Neuro: Cranial nerves 2-12 grossly intact, sensation grossly intact throughout Psych: A&Ox3 with an appropriate affect.    Lab Results:  Recent Labs    05/09/20 0525 05/10/20 0522  WBC 8.9 5.6  HGB 9.9* 9.7*  HCT 30.9* 29.6*  PLT 230 216   BMET Recent Labs    05/09/20 0525 05/10/20 0522  NA 137 136  K 5.0 4.3  CL 109 107  CO2 16* 17*  GLUCOSE 40* 102*  BUN 82* 75*  CREATININE 4.19* 4.03*  CALCIUM 8.4* 7.9*   PT/INR No results for input(s): LABPROT, INR in the last 72 hours. CMP     Component Value Date/Time   NA 136 05/10/2020 0522   K 4.3 05/10/2020 0522   CL 107 05/10/2020 0522   CO2 17 (L)  05/10/2020 0522   GLUCOSE 102 (H) 05/10/2020 0522   BUN 75 (H) 05/10/2020 0522   CREATININE 4.03 (H) 05/10/2020 0522   CALCIUM 7.9 (L) 05/10/2020 0522   PROT 6.0 (L) 05/10/2020 0522   ALBUMIN 2.9 (L) 05/10/2020 0522   AST 55 (H) 05/10/2020 0522   ALT 59 (H) 05/10/2020 0522   ALKPHOS 93 05/10/2020 0522   BILITOT 1.1 05/10/2020 0522   GFRNONAA 9 (L) 05/10/2020 0522   GFRAA 11 (L) 05/10/2020 0522   Lipase     Component Value Date/Time   LIPASE 22 05/08/2020 1231       Studies/Results: DG Chest 2 View  Result Date: 05/08/2020 CLINICAL DATA:  Fever. EXAM: CHEST - 2 VIEW COMPARISON:  December 13, 2019. FINDINGS: Stable cardiomegaly. Atherosclerosis of thoracic aorta is noted. No pneumothorax or pleural effusion is noted. No acute pulmonary disease is noted. Bony thorax is unremarkable. IMPRESSION: No active cardiopulmonary disease. Aortic atherosclerosis. Electronically Signed   By: Marijo Conception M.D.   On: 05/08/2020 12:41   NM Hepatobiliary Liver Func  Result Date: 05/09/2020 CLINICAL DATA:  Nausea and vomiting. Biliary sludge. Right upper quadrant pain EXAM: NUCLEAR MEDICINE HEPATOBILIARY IMAGING TECHNIQUE: Sequential images of the abdomen were obtained out to 60 minutes following intravenous administration of radiopharmaceutical. RADIOPHARMACEUTICALS:  4.75 mCi Tc-67m  Choletec IV COMPARISON:  Ultrasound 05/08/2020, MRCP 05/08/2020 FINDINGS: Prompt uptake and biliary excretion of activity  by the liver is seen. Gallbladder activity is visualized, consistent with patency of cystic duct. Biliary activity passes into small bowel, consistent with patent common bile duct. IMPRESSION: Negative hepatobiliary scan with patency of the cystic duct and common bile duct. Electronically Signed   By: Davina Poke D.O.   On: 05/09/2020 14:04   MR ABDOMEN MRCP WO CONTRAST  Result Date: 05/08/2020 CLINICAL DATA:  86 year old female with history of acute nonlocalized abdominal pain. Emesis. EXAM: MRI  ABDOMEN WITHOUT CONTRAST  (INCLUDING MRCP) TECHNIQUE: Multiplanar multisequence MR imaging of the abdomen was performed. Heavily T2-weighted images of the biliary and pancreatic ducts were obtained, and three-dimensional MRCP images were rendered by post processing. COMPARISON:  No prior abdominal MRI. Abdominal ultrasound 05/08/2020. FINDINGS: Comment: Today's study is limited for detection and characterization of visceral and/or vascular lesions by lack of IV gadolinium. Lower chest: Trace left pleural effusion. Hepatobiliary: Mild heterogeneous loss of signal intensity throughout the hepatic parenchyma on in phase dual echo images, suggesting heterogeneous hepatic iron deposition. This correlates to diffuse T2 hypointensity throughout the hepatic parenchyma. No discrete cystic or solid hepatic lesions are confidently identified on today's noncontrast examination. MRCP images demonstrate mild dilatation of the common bile duct which measures up to 9 mm in the porta hepatis, which could be age related. Notably, in the common hepatic duct there is a focal contour abnormality best appreciated on MRCP image 30 of series 12, which is poorly evaluated on today's noncontrast examination, but favored to be related to extrinsic compression from an adjacent vascular structure such as the common hepatic artery. No filling defect otherwise identified within the lumen of the common bile duct to suggest choledocholithiasis. There is some amorphous dependent material in the gallbladder suggesting biliary sludge. Gallbladder is moderately distended. No gallbladder wall thickening or pericholecystic fluid. Pancreas: No pancreatic mass or peripancreatic fluid collections or inflammatory changes noted on today's noncontrast examination. MRCP images demonstrate no pancreatic ductal dilatation. Spleen:  Unremarkable. Adrenals/Urinary Tract: In the lower pole of the left kidney there is a 1.3 cm T1 hypointense and T2 hyperintense  lesion which is incompletely characterized on today's non-contrast examination, but favored to represent a cyst. Right kidney and bilateral adrenal glands are unremarkable in appearance. No hydroureteronephrosis in the visualized portions of the abdomen. Stomach/Bowel: Visualized portions are unremarkable. Vascular/Lymphatic: Aortic atherosclerosis, without evidence of aneurysm in the abdominal vasculature. Other: No significant volume of ascites noted in the visualized portions of the peritoneal cavity. Musculoskeletal: No aggressive appearing osseous lesions are noted in the visualized portions of the skeleton. IMPRESSION: 1. Evidence of biliary sludge in the gallbladder, without evidence of acute cholecystitis. 2. Common bile duct is mildly dilated measuring 9 mm in the porta hepatis, however, this is likely within normal limits given the patient's advanced age. No choledocholithiasis. Additionally, there is no evidence of intrahepatic biliary ductal dilatation to suggest significant biliary tract obstruction. 3. Findings are suggestive of heterogeneous iron deposition in the liver, as discussed above. 4. Eccentric filling defect in the common hepatic duct which is favored to be related to extrinsic compression from an adjacent vascular structure, but is poorly evaluated on today's noncontrast examination. This could be more definitively evaluated with repeat abdominal MRI with and without IV gadolinium with MRCP if of clinical concern. 5. Aortic atherosclerosis. 6. Additional incidental findings, as above. Electronically Signed   By: Vinnie Langton M.D.   On: 05/08/2020 20:44   MR 3D Recon At Scanner  Result Date: 05/08/2020 CLINICAL DATA:  86 year old  female with history of acute nonlocalized abdominal pain. Emesis. EXAM: MRI ABDOMEN WITHOUT CONTRAST  (INCLUDING MRCP) TECHNIQUE: Multiplanar multisequence MR imaging of the abdomen was performed. Heavily T2-weighted images of the biliary and pancreatic ducts  were obtained, and three-dimensional MRCP images were rendered by post processing. COMPARISON:  No prior abdominal MRI. Abdominal ultrasound 05/08/2020. FINDINGS: Comment: Today's study is limited for detection and characterization of visceral and/or vascular lesions by lack of IV gadolinium. Lower chest: Trace left pleural effusion. Hepatobiliary: Mild heterogeneous loss of signal intensity throughout the hepatic parenchyma on in phase dual echo images, suggesting heterogeneous hepatic iron deposition. This correlates to diffuse T2 hypointensity throughout the hepatic parenchyma. No discrete cystic or solid hepatic lesions are confidently identified on today's noncontrast examination. MRCP images demonstrate mild dilatation of the common bile duct which measures up to 9 mm in the porta hepatis, which could be age related. Notably, in the common hepatic duct there is a focal contour abnormality best appreciated on MRCP image 30 of series 12, which is poorly evaluated on today's noncontrast examination, but favored to be related to extrinsic compression from an adjacent vascular structure such as the common hepatic artery. No filling defect otherwise identified within the lumen of the common bile duct to suggest choledocholithiasis. There is some amorphous dependent material in the gallbladder suggesting biliary sludge. Gallbladder is moderately distended. No gallbladder wall thickening or pericholecystic fluid. Pancreas: No pancreatic mass or peripancreatic fluid collections or inflammatory changes noted on today's noncontrast examination. MRCP images demonstrate no pancreatic ductal dilatation. Spleen:  Unremarkable. Adrenals/Urinary Tract: In the lower pole of the left kidney there is a 1.3 cm T1 hypointense and T2 hyperintense lesion which is incompletely characterized on today's non-contrast examination, but favored to represent a cyst. Right kidney and bilateral adrenal glands are unremarkable in appearance. No  hydroureteronephrosis in the visualized portions of the abdomen. Stomach/Bowel: Visualized portions are unremarkable. Vascular/Lymphatic: Aortic atherosclerosis, without evidence of aneurysm in the abdominal vasculature. Other: No significant volume of ascites noted in the visualized portions of the peritoneal cavity. Musculoskeletal: No aggressive appearing osseous lesions are noted in the visualized portions of the skeleton. IMPRESSION: 1. Evidence of biliary sludge in the gallbladder, without evidence of acute cholecystitis. 2. Common bile duct is mildly dilated measuring 9 mm in the porta hepatis, however, this is likely within normal limits given the patient's advanced age. No choledocholithiasis. Additionally, there is no evidence of intrahepatic biliary ductal dilatation to suggest significant biliary tract obstruction. 3. Findings are suggestive of heterogeneous iron deposition in the liver, as discussed above. 4. Eccentric filling defect in the common hepatic duct which is favored to be related to extrinsic compression from an adjacent vascular structure, but is poorly evaluated on today's noncontrast examination. This could be more definitively evaluated with repeat abdominal MRI with and without IV gadolinium with MRCP if of clinical concern. 5. Aortic atherosclerosis. 6. Additional incidental findings, as above. Electronically Signed   By: Vinnie Langton M.D.   On: 05/08/2020 20:44   US Abdomen Limited RUQ  Result Date: 05/08/2020 CLINICAL DATA:  Elevated LFTs, positive blood cultures, sludge seen on yesterday exam, continued RIGHT upper quadrant pain question acute cholecystitis EXAM: ULTRASOUND ABDOMEN LIMITED RIGHT UPPER QUADRANT COMPARISON:  05/07/2020 FINDINGS: Gallbladder: Significant sludge in gallbladder. No definite shadowing calculi. Minimal gallbladder wall thickening. No pericholecystic fluid or sonographic Murphy sign. Common bile duct: Diameter: 9 mm, increased from 4 mm yesterday  Liver: Normal echogenicity without mass or nodularity. Mild central intrahepatic biliary  dilatation new since previous exam. Portal vein is patent on color Doppler imaging with normal direction of blood flow towards the liver. Other: No RIGHT upper quadrant free fluid. IMPRESSION: Sludge within gallbladder with minimal gallbladder wall thickening but no definite calculi visualized. Interval increase in diameter of CBD now 9 mm (4 mm yesterday) with associated mild intrahepatic biliary dilatation centrally; CBD obstruction not excluded and in the setting of elevated LFTs, consider MRCP imaging. Electronically Signed   By: Lavonia Dana M.D.   On: 05/08/2020 16:30    Anti-infectives: Anti-infectives (From admission, onward)   Start     Dose/Rate Route Frequency Ordered Stop   05/09/20 1400  cefTRIAXone (ROCEPHIN) 2 g in sodium chloride 0.9 % 100 mL IVPB     2 g 200 mL/hr over 30 Minutes Intravenous Every 24 hours 05/08/20 1811     05/09/20 0600  metroNIDAZOLE (FLAGYL) IVPB 500 mg     500 mg 100 mL/hr over 60 Minutes Intravenous Every 8 hours 05/08/20 1811     05/08/20 1815  metroNIDAZOLE (FLAGYL) IVPB 500 mg     500 mg 100 mL/hr over 60 Minutes Intravenous NOW 05/08/20 1810 05/08/20 2158   05/08/20 1445  cefTRIAXone (ROCEPHIN) 2 g in sodium chloride 0.9 % 100 mL IVPB     2 g 200 mL/hr over 30 Minutes Intravenous  Once 05/08/20 1433 05/08/20 1600       Assessment/Plan HTN Combined systolic and diastolic CHF - last ECHO 16/3846 with EF 20-25% Hypothyroidism CKD stage IV Anemia of chronic disease  Sepsis due to E.coli, Klebsiella, and Enterobacter bactermia ?Cholangitis with transaminitis - LFTs still downtrending - WBC 5.6 and patient is afebrile - [place] 6/2 with minimal GB wall thickening, no pericholecystitis fluid, gallbladder sludge - MRCP 6/2 with sludge but no evidence of cholecystitis, dilated CBD but no choledocholithiasis, and heterogenous iron deposition in the liver - also noted  on MRCP - filling defect in the common hepatic duct which is favored to be related to extrinsic compression from an adjacent vascular structure - HIDA scan yesterday negative for cholecystitis and patient is non-tender on exam - I do not see any role for surgical intervention at this time, defer further medical management to GI and internal medicine - surgery will sign off at this time, please call if we can be of further assistance  FEN: HH diet VTE: SQ heparin ID: rocephin/[id number] 6/2>>  LOS: 2 days    Norm Parcel , [facility] 05/10/2020, 9:24 AM Please see Amion for pager number during day hours 7:00am-4:30pm

## 2020-05-10 NOTE — Consult Note (Signed)
Ref: Gaynelle Arabian, MD   Subjective:  Feeling better. Abdominal pain is resolving.  Echocardiogram shows mildly improved LV systolic function with EF 30-35 % now from 20-25 %. Mr and TR remains unchanged as moderate to severe and mild AI and PI.  Objective:  Vital Signs in the last 24 hours: Temp:  [97.3 F (36.3 C)-98.5 F (36.9 C)] 98.5 F (36.9 C) (06/04 1413) Pulse Rate:  [60-65] 64 (06/04 1413) Cardiac Rhythm: Normal sinus rhythm;Heart block;Bundle branch block (06/04 0906) Resp:  [18] 18 (06/04 1413) BP: (171-188)/(60-78) 188/73 (06/04 1413) SpO2:  [98 %-100 %] 100 % (06/04 1413)  Physical Exam: BP Readings from Last 1 Encounters:  05/10/20 (!) 188/73     Wt Readings from Last 1 Encounters:  05/08/20 49.9 kg    Weight change:  Body mass index is 20.78 kg/m. HEENT: [place]/AT, Eyes-Blue, PERL, EOMI, wears glassesConjunctiva-Pale pink, Sclera-Non-icteric Neck: No JVD, No bruit, Trachea midline. Lungs:  Clear, Bilateral. Cardiac:  Regular rhythm, normal S1 and S2, no S3. III/VI systolic and II/VI diastolic murmur right and left sternal borders. Abdomen:  Soft, non-tender. BS present. Extremities:  Trace edema present. No cyanosis. No clubbing. CNS: AxOx3, Cranial nerves grossly intact, moves all 4 extremities.  Skin: Warm and dry.   Intake/Output from previous day: 06/03 0701 - 06/04 0700 In: 1784.3 [I.V.:1385.7; IV Piggyback:398.5] Out: -     Lab Results: BMET    Component Value Date/Time   NA 136 05/10/2020 0522   NA 137 05/09/2020 0525   NA 136 05/08/2020 1231   K 4.3 05/10/2020 0522   K 5.0 05/09/2020 0525   K 4.8 05/08/2020 1231   CL 107 05/10/2020 0522   CL 109 05/09/2020 0525   CL 103 05/08/2020 1231   CO2 17 (L) 05/10/2020 0522   CO2 16 (L) 05/09/2020 0525   CO2 17 (L) 05/08/2020 1231   GLUCOSE 102 (H) 05/10/2020 0522   GLUCOSE 40 (LL) 05/09/2020 0525   GLUCOSE 78 05/08/2020 1231   BUN 75 (H) 05/10/2020 0522   BUN 82 (H) 05/09/2020 0525   BUN  73 (H) 05/08/2020 1231   CREATININE 4.03 (H) 05/10/2020 0522   CREATININE 4.19 (H) 05/09/2020 0525   CREATININE 3.89 (H) 05/08/2020 1231   CALCIUM 7.9 (L) 05/10/2020 0522   CALCIUM 8.4 (L) 05/09/2020 0525   CALCIUM 8.9 05/08/2020 1231   GFRNONAA 9 (L) 05/10/2020 0522   GFRNONAA 9 (L) 05/09/2020 0525   GFRNONAA 10 (L) 05/08/2020 1231   GFRAA 11 (L) 05/10/2020 0522   GFRAA 10 (L) 05/09/2020 0525   GFRAA 11 (L) 05/08/2020 1231   CBC    Component Value Date/Time   WBC 5.6 05/10/2020 0522   RBC 2.92 (L) 05/10/2020 0522   HGB 9.7 (L) 05/10/2020 0522   HCT 29.6 (L) 05/10/2020 0522   PLT 216 05/10/2020 0522   MCV 101.4 (H) 05/10/2020 0522   MCH 33.2 05/10/2020 0522   MCHC 32.8 05/10/2020 0522   RDW 15.2 05/10/2020 0522   LYMPHSABS 0.4 (L) 05/08/2020 1231   MONOABS 0.6 05/08/2020 1231   EOSABS 0.0 05/08/2020 1231   BASOSABS 0.0 05/08/2020 1231   HEPATIC Function Panel Recent Labs    05/08/20 1231 05/09/20 0525 05/10/20 0522  PROT 7.0 6.0* 6.0*   HEMOGLOBIN A1C No components found for: HGA1C,  MPG CARDIAC ENZYMES Lab Results  Component Value Date   TROPONINI <0.03 12/01/2018   BNP No results for input(s): PROBNP in the last 8760 hours. TSH Recent Labs  10/27/19 0413 05/09/20 0525  TSH 2.048 2.342   CHOLESTEROL Recent Labs    10/28/19 0302  CHOL 127    Scheduled Meds: . amLODipine  2.5 mg Oral Daily  . aspirin  81 mg Oral QHS  . citalopram  10 mg Oral Daily  . heparin  5,000 Units Subcutaneous Q8H  . hydrALAZINE  50 mg Oral Q12H  . levothyroxine  50 mcg Oral QAC breakfast  . metoprolol tartrate  2.5 mg Intravenous Q6H  . sodium bicarbonate  650 mg Oral Daily  . ursodiol  300 mg Oral BID   Continuous Infusions: . sodium chloride    . cefTRIAXone (ROCEPHIN)  IV 2 g (05/10/20 1440)  . metronidazole 500 mg (05/10/20 1440)   PRN Meds:.acetaminophen, fentaNYL (SUBLIMAZE) injection, hydrALAZINE, ondansetron **OR** ondansetron (ZOFRAN)  IV  Assessment/Plan: Acute E. Coli and Klebsiella sepsis Chronic systolic and diastolic left heart failure HTN Hypothyroidism CKD, IV Anemia of chronic disease Dehydration  May undergo surgery if needed in emergency. Medical treatment preferred by patient and family.   LOS: 2 days   Time spent including chart review, lab review, examination, discussion with patient, nurse, family and other consultants : 41 min   [name]  MD  05/10/2020, 3:31 PM

## 2020-05-10 NOTE — Progress Notes (Signed)
  Echocardiogram 2D Echocardiogram has been performed.  Jennette Dubin 05/10/2020, 2:16 PM

## 2020-05-10 NOTE — Progress Notes (Signed)
BP= 187/78. PRN medication was given. Will recheck BP later. Patient is alert and oriented x 4

## 2020-05-10 NOTE — Progress Notes (Signed)
Patient is doing very well.  Tolerating solid diet.  Patient remains afebrile, and labs continue to improve.  She appears slightly jaundiced, but abdomen is nontender.  She is alert, coherent, lying in bed in no distress.  Her daughter is at the bedside.  Specifically, the patient's liver chemistries have dropped about 50%, AST is now 55, ALT 59, bilirubin 1.1.    The patient's white count has dropped further from 8.9 to 5.6.  Dr. Merrilee Jansky note reviewed; he recommends medical therapy if possible.  However, a repeat echo is pending and if LV function is improved, that might make procedural interventions more appropriate.  Recommendations:  1.  I recommend medical therapy, specifically, ursodeoxycholic acid 872 mg p.o. twice daily (ordered)  2.  As per surgeon's note, despite gallbladder sludge, the patient is too high risk to justify having her gallbladder removed, especially with a negative HIDA scan  3.  With a negative MRCP and drop in labs, I think the benefit of ERCP would be quite low, and the risk would not be trivial in this medically frail patient.  The patient and her daughter are content to sit tight, with observation and medical therapy.  4.  I would defer to the hospitalist as to how long the patient needs to remain in the hospital on IV antibiotics, and how long she would need oral antibiotics post discharge.  I do recommend concurrent Florastor probiotic therapy for the duration of antibiotic therapy, to help prevent C. difficile colitis.  5.  The patient and her daughter do realize that there is a risk of recurrent biliary symptoms, but in my opinion, this risk would be less than attempts to prevent it by surgical or endoscopic intervention.  Cleotis Nipper, M.D. Pager [phone] If no answer or after 5 PM call [phone]

## 2020-05-10 NOTE — Consult Note (Signed)
Renal Service Consult Note [facility]  Alicia Sellers 05/10/2020 Sol Blazing Requesting Physician:  Dr Tana Coast  Reason for Consult:  Renal failure HPI: The patient is a 86 y.o. year-old w/ hx of HTN, DJD, anemia and CKD presented to ED on 6/1 w/ naused and vomiting/ diarrhea and abd pain. She was stabilized and dc'd but her blood cx's came back + so she was called back on 6/2 for admission. CT abd showed GB sludge w/o acute changes.  Creat on admission was 3.9.  Last creat here was 4.40 on 10/30/2019. Asked to see for renal failure.   Pt seen in room.  Daughter provides some history. Pt denies any voiding issues, change in urine color.  No recent wt loss , anorexia or N/V or confusion.  Pt f/b Dr Justin Mend at [facility]. Per the patient and the daughter they are not interested in pursuing dialysis, they have d/w dR [place] on multiple occassions. Their plan is for conservative care.    Date  Creat  eGFR  11/2018 2.25- 2.72 15- 26 Oct 2019 4.08- 4.40 8- 9   05/08/20 3.90  10   05/10/20 4.03  9    ROS  denies CP  no joint pain   no HA  no blurry vision  no rash  no diarrhea  no nausea/ vomiting  no dysuria  no difficulty voiding  no change in urine color    Past Medical History  Past Medical History:  Diagnosis Date  . Acute on chronic left systolic heart failure ([place]) 10/26/2019  . Anemia   . Anxiety   . Arthritis   . Chronic kidney disease   . Dyspnea   . Headache   . Hypertension   . Hypothyroidism    Past Surgical History  Past Surgical History:  Procedure Laterality Date  . APPENDECTOMY    . BREAST BIOPSY Left 2015   benign  . EYE SURGERY    . TONSILLECTOMY     Family History  Family History  Problem Relation Age of Onset  . Breast cancer Neg Hx    Social History  reports that she has never smoked. She has never used smokeless tobacco. She reports that she does not drink alcohol or use drugs. Allergies  Allergies  Allergen Reactions  . Latex Rash   . Penicillins Rash    DID THE REACTION INVOLVE: Swelling of the face/tongue/throat, SOB, or low BP? No Sudden or severe rash/hives, skin peeling, or the inside of the mouth or nose? No Did it require medical treatment? No When did it last happen? If all above answers are "NO", may proceed with cephalosporin use.   Home medications Prior to Admission medications   Medication Sig Start Date End Date Taking? Authorizing Provider  amLODipine (NORVASC) 5 MG tablet Take 0.5 tablets (2.5 mg total) by mouth daily. 10/30/19  Yes [name], MD  aspirin 81 MG chewable tablet Chew 81 mg by mouth at bedtime.    Yes [provider]  citalopram (CELEXA) 10 MG tablet Take 10 mg by mouth daily.   Yes [provider]  furosemide (LASIX) 40 MG tablet Take 1 tablet (40 mg total) by mouth daily. Patient taking differently: Take 20 mg by mouth daily.  10/31/19  Yes [name], MD  gemfibrozil (LOPID) 600 MG tablet Take 600 mg by mouth 2 (two) times daily before a meal.   Yes [provider]  hydrALAZINE (APRESOLINE) 25 MG tablet Take 50 mg  by mouth in the morning and at bedtime.    Yes [provider]  levothyroxine (SYNTHROID, LEVOTHROID) 50 MCG tablet Take 50 mcg by mouth daily before breakfast.   Yes [provider]  LORazepam (ATIVAN) 0.5 MG tablet Take 0.25 mg by mouth at bedtime.    Yes [provider]  metoprolol succinate (TOPROL-XL) 25 MG 24 hr tablet Take 12.5 mg by mouth every evening.    Yes [provider]  Omega-3 Fatty Acids (FISH OIL) 1000 MG CAPS Take 1 capsule (1,000 mg total) by mouth 2 (two) times daily with a meal. 12/05/18  Yes Gherghe, Vella Redhead, MD  ondansetron (ZOFRAN) 8 MG tablet Take 1 tablet (8 mg total) by mouth every 8 (eight) hours as needed for nausea or vomiting. 05/08/20  Yes Daleen Bo, MD  Red Yeast Rice 600 MG TABS Take 600 tablets by mouth 2 (two) times daily.   Yes [provider]  sodium  bicarbonate 650 MG tablet Take 650 mg by mouth daily.  07/19/19  Yes [provider]  vitamin E 400 UNIT capsule Take 400 Units by mouth daily.   Yes [provider]  neomycin-polymyxin-hydrocortisone (CORTISPORIN) OTIC solution Apply 1 to 2 drops to toe BID after soaking Patient not taking: Reported on 05/07/2020 04/10/20   Wallene Huh, DPM  polyethylene glycol (MIRALAX / GLYCOLAX) 17 g packet Take 17 g by mouth daily. Patient not taking: Reported on 05/07/2020 10/31/19   [name], MD     Vitals:   05/09/20 2029 05/10/20 0432 05/10/20 0500 05/10/20 1148  BP: (!) 171/71 (!) 187/78 (!) 171/61 (!) 184/60  Pulse: 63 60 60 65  Resp: 18 18    Temp: 98.4 F (36.9 C) (!) 97.3 F (36.3 C)    TempSrc: Oral Oral    SpO2: 98% 98%    Weight:      Height:       Exam Gen alert elderly WF, no distress3 No rash, cyanosis or gangrene Sclera anicteric, throat clear slightly dry  No jvd or bruits, flat neck veins Chest clear bilat to bases no rales, wheezing or bronchial BS RRR no MRG Abd soft ntnd no mass or ascites +bs GU defer MS no joint effusions or deformity Ext no LE or UE edema, no wounds or ulcers Neuro is alert, Ox 3 , nf    Home meds:  - ecasa 81 / norvasc 2.5/ lasix 20 qd/ hydral 50 bid/ metoprolol xl 12.5 hs  - sod bicarb 650 qd/ synthroid 50ug qam/ lopid 600 bid  - celexa 10 qd  - prn's/ vitamins/ supplements    UA 6/1 - negative , prot 100   Abd [place] - Right Kidney: Length: 8.3 cm. There is increased echogenicity seen throughout the right kidney.Marland KitchenMarland KitchenLeft Kidney: Length: 7.8 cm. Echogenicity within normal limits. There is a complex anechoic cyst seen within the lower pole measuring 1.4 x 1.3 x 1.3 cm.    Date  Creat  eGFR  11/2018 2.25- 2.72 15- 26 Oct 2019 4.08- 4.40 8- 9   05/08/20 3.90  10   05/10/20 4.03  9      Na 136  K 4.3  BUN 75  Cr 4.03  CO2 17  Hb 9.7 WBC 5k  plt 216k   Assessment/ Plan: 1. CKD stage V - stage V CKD noted in Nov 2020, renal  function here is similar at eGFR 8-10 ml/ min.  Pt is f/b Dr Justin Mend, plan is for  conservative care at this time per the pt and her daughter, no dialysis.  She may be a bit dry still, will give IVF's at 65 cc/hr for now 1/2 NS.  Will follow.  2. EColi sepsis - w/ nausea / vomiting, on IV abx, improving. w/u per primary 3. HTN - BP's up, no vol overload, cont meds 4. Anemia ckd - Hb 9 - 11 range, follow 5. Chron combined s/d CHF - LVEF 20-25% by echo      Kelly Splinter  MD 05/10/2020, 1:17 PM  Recent Labs  Lab 05/09/20 0525 05/10/20 0522  WBC 8.9 5.6  HGB 9.9* 9.7*   Recent Labs  Lab 05/09/20 0525 05/10/20 0522  K 5.0 4.3  BUN 82* 75*  CREATININE 4.19* 4.03*  CALCIUM 8.4* 7.9*

## 2020-05-10 NOTE — Care Management Important Message (Signed)
Important Message  Patient Details  Name: Alicia Sellers MRN: [id number] Date of Birth: 03-24-1929   Medicare Important Message Given:     Document has been given to [place], NCM  to give to the patient.  [name] 05/10/2020, 11:16 AM

## 2020-05-10 NOTE — Plan of Care (Signed)

## 2020-05-10 NOTE — Progress Notes (Addendum)
[facility]                                                                              Patient Demographics  Alicia Sellers, is a 86 y.o. female, DOB - [birth year], EGB:[id number]  Admit date - 05/08/2020   Admitting Physician Rise Patience, MD  Outpatient Primary MD for the patient is Gaynelle Arabian, MD  Outpatient specialists:   LOS - 2  days   Medical records reviewed and are as summarized below:    Chief Complaint  Patient presents with  . Abnormal Lab       Brief summary   Patient is a 86 year old female with history of chronic systolic CHF, EF 20 to 37%, grade 1 diastolic dysfunction (2D echo 10/2019, primary cardiologist, Dr. Doylene Canard), hypertension, hyperlipidemia, CKD stage IV, hypothyroidism initially presented to ED on 05/07/2020 for nausea vomiting, abdominal pain and diarrhea.   No hematemesis, hematochezia or melena.  She described the abdominal pain as 9/10, intermittent and aching.  The pain worsened after she ate food.  Subsequently she started having chest pain and she got concerned and called 911.  No fevers or chills.,  No dysuria or hematuria. Work-up in the ED on 6/1 with CT abdomen showed no diverticulitis, distention of the gallbladder, no visible stones, mild transaminitis, neutropenia with white count of 1.3.  Creatinine close to her baseline 3.9. Patient subsequently felt better in ED, and was able to eat and drink water.  She felt that she could manage at home hence patient was discharged from ED. Patient was called at her home on 05/08/2020 for positive blood cultures + E. Coli.    She was admitted for further work-up. Assessment & Plan    Principal Problem:   Sepsis due to Escherichia coli (E. coli) ([place]), Klebsiella, septic cholangitis, transaminitis -Likely due to GI source, most likely due to septic cholangitis.  No acute cholecystitis on ultrasound or CT.  -  Ultrasound abdomen showed sludge within the gallbladder with minimal  gallbladder wall thickening, no definitive calculi, interval increase in CBD diameter now 9 mm (4 mm yesterday) -MRCP showed biliary sludge in the gallbladder, no acute cholecystitis, CBD mildly dilated 9 mm, likely within normal limits with patient's age, no choledocholithiasis.  Eccentric filling defect in common hepatic duct  -HIDA scan negative.  Surgery signed off, despite gallbladder sludge, high risk to justify having the gallbladder removed with negative HIDA -LFTs improving, no complaints of any abdominal pain, per GI, benefit of ERCP would be low. -Patient tolerating diet well, mobilize, PT evaluation today -Since no procedures are planned, patient declined to have another echocardiogram - will continue antibiotics for 7 days for gram-negative bacteremia, sensitivities pending   Active Problems:   HTN (hypertension) -BP elevated, will restart amlodipine, hydralazine -Holding Lasix due to sepsis, dehydration at the time of admission -Creatinine 4.0    Hypothyroidism -Restart oral Synthroid  -TSH 2.3    Anemia due to chronic kidney disease -Baseline 9 -11 -H&H stable    Chronic combined systolic and diastolic CHF (congestive heart failure) ([place]) -2D echo 10/2019 had shown EF of 20 to 25% with grade 1  diastolic dysfunction -Was volume depleted at the time of admission due to vomiting and diarrhea, needing gentle hydration -Creatinine currently 4.0, Lasix has been on hold, cardiology following.  IV fluids discontinued.  CKD stage IV, AG metabolic acidosis -Baseline Cr 4.2-4.5 -Creatinine 4.0 -Due to patient's request, nephrology was consulted, will restart sodium bicarb, Await nephrology recommendations regarding restarting Lasix  Code Status: Full CODE STATUS DVT Prophylaxis: Heparin subcu Family Communication: Discussed all imaging results, lab results, explained to the patient and patient's daughter-in-law at the bedside   Disposition Plan:     Status is:  Inpatient  Remains inpatient appropriate because:Inpatient level of care appropriate due to severity of illness possible DC home in a.m.   Dispo: The patient is from: Home              Anticipated d/c is to: Home              Anticipated d/c date is: 1 day              Patient currently is not medically stable to d/c.   Time Spent in minutes 25 minutes  Procedures:  MRCP  Consultants:   Gastroenterology General surgery Cardiology Nephrology   Antimicrobials:   Anti-infectives (From admission, onward)   Start     Dose/Rate Route Frequency Ordered Stop   05/09/20 1400  cefTRIAXone (ROCEPHIN) 2 g in sodium chloride 0.9 % 100 mL IVPB     2 g 200 mL/hr over 30 Minutes Intravenous Every 24 hours 05/08/20 1811     05/09/20 0600  metroNIDAZOLE (FLAGYL) IVPB 500 mg     500 mg 100 mL/hr over 60 Minutes Intravenous Every 8 hours 05/08/20 1811     05/08/20 1815  metroNIDAZOLE (FLAGYL) IVPB 500 mg     500 mg 100 mL/hr over 60 Minutes Intravenous NOW 05/08/20 1810 05/08/20 2158   05/08/20 1445  cefTRIAXone (ROCEPHIN) 2 g in sodium chloride 0.9 % 100 mL IVPB     2 g 200 mL/hr over 30 Minutes Intravenous  Once 05/08/20 1433 05/08/20 1600         Medications  Scheduled Meds: . heparin  5,000 Units Subcutaneous Q8H  . levothyroxine  50 mcg Oral QAC breakfast  . metoprolol tartrate  2.5 mg Intravenous Q6H  . ursodiol  300 mg Oral BID   Continuous Infusions: . sodium chloride    . cefTRIAXone (ROCEPHIN)  IV Stopped (05/09/20 1500)  . metronidazole 500 mg (05/10/20 0503)   PRN Meds:.acetaminophen, fentaNYL (SUBLIMAZE) injection, hydrALAZINE, ondansetron **OR** ondansetron (ZOFRAN) IV      Subjective:   Alicia Sellers was seen and examined today.  Sitting up in the bed, eating.  Daughter-in-law at the bedside.  No acute complaints, no abdominal pain nausea or vomiting.  No fevers.  BP elevated.  No chest pain or headache.   Patient denies dizziness, shortness of breath,  new  weakness, numbess, tingling. No acute events overnight.  No fevers.  Objective:   Vitals:   05/09/20 2029 05/10/20 0432 05/10/20 0500 05/10/20 1148  BP: (!) 171/71 (!) 187/78 (!) 171/61 (!) 184/60  Pulse: 63 60 60 65  Resp: 18 18    Temp: 98.4 F (36.9 C) (!) 97.3 F (36.3 C)    TempSrc: Oral Oral    SpO2: 98% 98%    Weight:      Height:        Intake/Output Summary (Last 24 hours) at 05/10/2020 1308 Last data filed at 05/10/2020 0400 Gross  per 24 hour  Intake 1784.25 ml  Output --  Net 1784.25 ml     Wt Readings from Last 3 Encounters:  05/08/20 49.9 kg  11/08/19 53.5 kg  10/29/19 55 kg   Physical Exam  General: Alert and oriented x 3, NAD  Cardiovascular: S1 S2 clear, RRR. No pedal edema b/l  Respiratory: CTAB, no wheezing, rales or rhonchi  Gastrointestinal: Soft, nontender, nondistended, NBS  Ext: no pedal edema bilaterally  Neuro: no new deficits  Musculoskeletal: No cyanosis, clubbing  Skin: No rashes  Psych: Normal affect and demeanor, alert and oriented x3    Data Reviewed:  I have personally reviewed following labs and imaging studies  Micro Results Recent Results (from the past 240 hour(s))  Blood culture (routine x 2)     Status: Abnormal (Preliminary result)   Collection Time: 05/07/20  2:37 PM   Specimen: BLOOD  Result Value Ref Range Status   Specimen Description   Final    BLOOD RIGHT ANTECUBITAL Performed at [facility], [address] [place], [place] [address]    Special Requests   Final    BOTTLES DRAWN AEROBIC AND ANAEROBIC Blood Culture adequate volume Performed at [facility] [address] [place], [place] [address]    Culture  Setup Time   Final    GRAM NEGATIVE RODS IN BOTH AEROBIC AND ANAEROBIC BOTTLES CRITICAL RESULT CALLED TO, READ BACK BY AND VERIFIED WITH: RN Marvetta Gibbons [phone] FCP Performed at [facility], [place] [address] [place], [place] [address]    Culture ESCHERICHIA  COLI KLEBSIELLA PNEUMONIAE  (A)  Final   Report Status PENDING  Incomplete  Blood Culture ID Panel (Reflexed)     Status: Abnormal   Collection Time: 05/07/20  2:37 PM  Result Value Ref Range Status   Enterococcus species NOT DETECTED NOT DETECTED Final   Listeria monocytogenes NOT DETECTED NOT DETECTED Final   Staphylococcus species NOT DETECTED NOT DETECTED Final   Staphylococcus aureus (BCID) NOT DETECTED NOT DETECTED Final   Streptococcus species NOT DETECTED NOT DETECTED Final   Streptococcus agalactiae NOT DETECTED NOT DETECTED Final   Streptococcus pneumoniae NOT DETECTED NOT DETECTED Final   Streptococcus pyogenes NOT DETECTED NOT DETECTED Final   Acinetobacter baumannii NOT DETECTED NOT DETECTED Final   Enterobacteriaceae species DETECTED (A) NOT DETECTED Final    Comment: CRITICAL RESULT CALLED TO, READ BACK BY AND VERIFIED WITH: RN SAM C [id number] FCP    Enterobacter cloacae complex NOT DETECTED NOT DETECTED Final   Escherichia coli DETECTED (A) NOT DETECTED Final    Comment: CRITICAL RESULT CALLED TO, READ BACK BY AND VERIFIED WITH: RN SAM C [id number] [phone] FCP    Klebsiella oxytoca NOT DETECTED NOT DETECTED Final   Klebsiella pneumoniae DETECTED (A) NOT DETECTED Final    Comment: CRITICAL RESULT CALLED TO, READ BACK BY AND VERIFIED WITH: RN SAM C [id number] FCP    Proteus species NOT DETECTED NOT DETECTED Final   Serratia marcescens NOT DETECTED NOT DETECTED Final   Carbapenem resistance NOT DETECTED NOT DETECTED Final   Haemophilus influenzae NOT DETECTED NOT DETECTED Final   Neisseria meningitidis NOT DETECTED NOT DETECTED Final   Pseudomonas aeruginosa NOT DETECTED NOT DETECTED Final   Candida albicans NOT DETECTED NOT DETECTED Final   Candida glabrata NOT DETECTED NOT DETECTED Final   Candida krusei NOT DETECTED NOT DETECTED Final   Candida parapsilosis NOT DETECTED NOT DETECTED Final   Candida tropicalis  NOT DETECTED NOT DETECTED Final    Comment: Performed  at [facility], [place] [address], [place], [place] [address]  SARS Coronavirus 2 by RT PCR (hospital order, performed in [facility] hospital lab) Nasopharyngeal Nasopharyngeal Swab     Status: None   Collection Time: 05/07/20  4:38 PM   Specimen: Nasopharyngeal Swab  Result Value Ref Range Status   SARS Coronavirus 2 NEGATIVE NEGATIVE Final    Comment: (NOTE) SARS-CoV-2 target nucleic acids are NOT DETECTED. The SARS-CoV-2 RNA is generally detectable in upper and lower respiratory specimens during the acute phase of infection. The lowest concentration of SARS-CoV-2 viral copies this assay can detect is 250 copies / mL. A negative result does not preclude SARS-CoV-2 infection and should not be used as the sole basis for treatment or other patient management decisions.  A negative result may occur with improper specimen collection / handling, submission of specimen other than nasopharyngeal swab, presence of viral mutation(s) within the areas targeted by this assay, and inadequate number of viral copies (<250 copies / mL). A negative result must be combined with clinical observations, patient history, and epidemiological information. Fact Sheet for Patients:   StrictlyIdeas.no Fact Sheet for Healthcare Providers: BankingDealers.co.za This test is not yet approved or cleared  by the [place] FDA and has been authorized for detection and/or diagnosis of SARS-CoV-2 by FDA under an Emergency Use Authorization (EUA).  This EUA will remain in effect (meaning this test can be used) for the duration of the COVID-19 declaration under Section 564(b)(1) of the Act, 21 U.S.C. section 360bbb-3(b)(1), unless the authorization is terminated or revoked sooner. Performed at [facility], [place] [address] [place], [place] [address]   Urine culture     Status: None   Collection Time: 05/07/20  9:38 PM   Specimen: Urine, Random   Result Value Ref Range Status   Specimen Description   Final    URINE, RANDOM Performed at [facility] [address] [place], [place] [address]    Special Requests   Final    NONE Performed at [facility], [place] [address] [place], [place] [address]    Culture   Final    NO GROWTH Performed at [facility], [place] [address] [place], [place] [address]    Report Status 05/08/2020 FINAL  Final  Blood culture (routine x 2)     Status: None (Preliminary result)   Collection Time: 05/08/20 12:31 PM   Specimen: BLOOD  Result Value Ref Range Status   Specimen Description   Final    BLOOD LEFT ANTECUBITAL Performed at [facility] [address], [place], [place] [address]    Special Requests   Final    BOTTLES DRAWN AEROBIC AND ANAEROBIC Blood Culture adequate volume Performed at [facility] [address], [place], [place] [address]    Culture  Setup Time   Final    GRAM NEGATIVE RODS AEROBIC BOTTLE ONLY CRITICAL VALUE NOTED.  VALUE IS CONSISTENT WITH PREVIOUSLY REPORTED AND CALLED VALUE. Performed at [facility], [place] [address] [place], [place] [address]    Culture GRAM NEGATIVE RODS  Final   Report Status PENDING  Incomplete  Blood culture (routine x 2)     Status: None (Preliminary result)   Collection Time: 05/08/20 12:31 PM   Specimen: BLOOD RIGHT FOREARM  Result Value Ref Range Status   Specimen Description   Final  BLOOD RIGHT FOREARM Performed at [facility] [address], [place], [place] [address]    Special Requests   Final    BOTTLES DRAWN AEROBIC AND ANAEROBIC Blood Culture results may not be optimal due to an excessive volume of blood received in culture bottles Performed at [facility] [address], [place], [place] [address]    Culture  Setup Time   Final    GRAM NEGATIVE RODS AEROBIC BOTTLE ONLY CRITICAL RESULT  CALLED TO, READ BACK BY AND VERIFIED WITH: PHARMD N [place] [id number] AT 652 AM BY CM Performed at [facility], [place] [address], [place], [place] [address]    Culture GRAM NEGATIVE RODS  Final   Report Status PENDING  Incomplete    Radiology Reports CT ABDOMEN PELVIS WO CONTRAST  Result Date: 05/07/2020 CLINICAL DATA:  Nausea, vomiting EXAM: CT ABDOMEN AND PELVIS WITHOUT CONTRAST TECHNIQUE: Multidetector CT imaging of the abdomen and pelvis was performed following the standard protocol without IV contrast. COMPARISON:  Ultrasound 07/22/2018 FINDINGS: Lower chest: Bibasilar atelectasis or scarring. No acute abnormality. Hepatobiliary: Gallbladder is distended. No visible stones. Suspect mild intrahepatic biliary ductal dilatation. Common bile duct appears to be normal caliber. Pancreas: No focal abnormality or ductal dilatation. Spleen: No focal abnormality.  Normal size. Adrenals/Urinary Tract: Renovascular calcifications within the renal hila bilaterally. No urinary tract stones or hydronephrosis. Adrenal glands and urinary bladder unremarkable. Stomach/Bowel: Diffuse colonic diverticulosis. No active diverticulitis. Stomach and small bowel decompressed, unremarkable. Vascular/Lymphatic: Aortic atherosclerosis. No enlarged abdominal or pelvic lymph nodes. Reproductive: Uterus and adnexa unremarkable.  No mass. Other: No free fluid or free air. Musculoskeletal: No acute bony abnormality. IMPRESSION: Diffuse colonic diverticulosis. No inflammatory changes to suggest active diverticulitis. Distention of the gallbladder. There appears to be mild intrahepatic biliary ductal dilatation. No visible stones. This could be further evaluated with right upper quadrant ultrasound if felt clinically indicated. Aortic atherosclerosis. Electronically Signed   By: Rolm Baptise M.D.   On: 05/07/2020 18:34   DG Chest 2 View  Result Date: 05/08/2020 CLINICAL DATA:  Fever. EXAM: CHEST - 2 VIEW COMPARISON:  December 13, 2019. FINDINGS: Stable cardiomegaly. Atherosclerosis of thoracic aorta is noted. No pneumothorax or pleural effusion is noted. No acute pulmonary disease is noted. Bony thorax is unremarkable. IMPRESSION: No active cardiopulmonary disease. Aortic atherosclerosis. Electronically Signed   By: Marijo Conception M.D.   On: 05/08/2020 12:41   NM Hepatobiliary Liver Func  Result Date: 05/09/2020 CLINICAL DATA:  Nausea and vomiting. Biliary sludge. Right upper quadrant pain EXAM: NUCLEAR MEDICINE HEPATOBILIARY IMAGING TECHNIQUE: Sequential images of the abdomen were obtained out to 60 minutes following intravenous administration of radiopharmaceutical. RADIOPHARMACEUTICALS:  4.75 mCi Tc-71m  Choletec IV COMPARISON:  Ultrasound 05/08/2020, MRCP 05/08/2020 FINDINGS: Prompt uptake and biliary excretion of activity by the liver is seen. Gallbladder activity is visualized, consistent with patency of cystic duct. Biliary activity passes into small bowel, consistent with patent common bile duct. IMPRESSION: Negative hepatobiliary scan with patency of the cystic duct and common bile duct. Electronically Signed   By: Davina Poke D.O.   On: 05/09/2020 14:04   US Abdomen Complete  Result Date: 05/07/2020 CLINICAL DATA:  Upper abdominal pain for 2 days EXAM: ABDOMEN ULTRASOUND COMPLETE COMPARISON:  None. FINDINGS: Gallbladder: Layering hyperdense sludge is seen. No sonographic Murphy sign noted by sonographer. No gallbladder wall thickening. Common bile duct: Diameter: 4 mm Liver: No focal lesion identified. Within normal limits in parenchymal echogenicity. Portal  vein is patent on color Doppler imaging with normal direction of blood flow towards the liver. IVC: No abnormality visualized. Pancreas: Visualized portion unremarkable. Spleen: Size and appearance within normal limits. Right Kidney: Length: 8.3 cm. There is increased echogenicity seen throughout the right kidney. Trace free fluid seen around the right kidney. Left  Kidney: Length: 7.8 cm. Echogenicity within normal limits. There is a complex anechoic cyst seen within the lower pole measuring 1.4 x 1.3 x 1.3 cm. Abdominal aorta: Limited due to overlying bowel gas Other findings: None. IMPRESSION: Layering hyperdense sludge.  No evidence of acute cholecystitis. Electronically Signed   By: Prudencio Pair M.D.   On: 05/07/2020 23:05   MR ABDOMEN MRCP WO CONTRAST  Result Date: 05/08/2020 CLINICAL DATA:  86 year old female with history of acute nonlocalized abdominal pain. Emesis. EXAM: MRI ABDOMEN WITHOUT CONTRAST  (INCLUDING MRCP) TECHNIQUE: Multiplanar multisequence MR imaging of the abdomen was performed. Heavily T2-weighted images of the biliary and pancreatic ducts were obtained, and three-dimensional MRCP images were rendered by post processing. COMPARISON:  No prior abdominal MRI. Abdominal ultrasound 05/08/2020. FINDINGS: Comment: Today's study is limited for detection and characterization of visceral and/or vascular lesions by lack of IV gadolinium. Lower chest: Trace left pleural effusion. Hepatobiliary: Mild heterogeneous loss of signal intensity throughout the hepatic parenchyma on in phase dual echo images, suggesting heterogeneous hepatic iron deposition. This correlates to diffuse T2 hypointensity throughout the hepatic parenchyma. No discrete cystic or solid hepatic lesions are confidently identified on today's noncontrast examination. MRCP images demonstrate mild dilatation of the common bile duct which measures up to 9 mm in the porta hepatis, which could be age related. Notably, in the common hepatic duct there is a focal contour abnormality best appreciated on MRCP image 30 of series 12, which is poorly evaluated on today's noncontrast examination, but favored to be related to extrinsic compression from an adjacent vascular structure such as the common hepatic artery. No filling defect otherwise identified within the lumen of the common bile duct to suggest  choledocholithiasis. There is some amorphous dependent material in the gallbladder suggesting biliary sludge. Gallbladder is moderately distended. No gallbladder wall thickening or pericholecystic fluid. Pancreas: No pancreatic mass or peripancreatic fluid collections or inflammatory changes noted on today's noncontrast examination. MRCP images demonstrate no pancreatic ductal dilatation. Spleen:  Unremarkable. Adrenals/Urinary Tract: In the lower pole of the left kidney there is a 1.3 cm T1 hypointense and T2 hyperintense lesion which is incompletely characterized on today's non-contrast examination, but favored to represent a cyst. Right kidney and bilateral adrenal glands are unremarkable in appearance. No hydroureteronephrosis in the visualized portions of the abdomen. Stomach/Bowel: Visualized portions are unremarkable. Vascular/Lymphatic: Aortic atherosclerosis, without evidence of aneurysm in the abdominal vasculature. Other: No significant volume of ascites noted in the visualized portions of the peritoneal cavity. Musculoskeletal: No aggressive appearing osseous lesions are noted in the visualized portions of the skeleton. IMPRESSION: 1. Evidence of biliary sludge in the gallbladder, without evidence of acute cholecystitis. 2. Common bile duct is mildly dilated measuring 9 mm in the porta hepatis, however, this is likely within normal limits given the patient's advanced age. No choledocholithiasis. Additionally, there is no evidence of intrahepatic biliary ductal dilatation to suggest significant biliary tract obstruction. 3. Findings are suggestive of heterogeneous iron deposition in the liver, as discussed above. 4. Eccentric filling defect in the common hepatic duct which is favored to be related to extrinsic compression from an adjacent vascular structure, but is poorly evaluated on today's noncontrast  examination. This could be more definitively evaluated with repeat abdominal MRI with and without IV  gadolinium with MRCP if of clinical concern. 5. Aortic atherosclerosis. 6. Additional incidental findings, as above. Electronically Signed   By: Vinnie Langton M.D.   On: 05/08/2020 20:44   MR 3D Recon At Scanner  Result Date: 05/08/2020 CLINICAL DATA:  86 year old female with history of acute nonlocalized abdominal pain. Emesis. EXAM: MRI ABDOMEN WITHOUT CONTRAST  (INCLUDING MRCP) TECHNIQUE: Multiplanar multisequence MR imaging of the abdomen was performed. Heavily T2-weighted images of the biliary and pancreatic ducts were obtained, and three-dimensional MRCP images were rendered by post processing. COMPARISON:  No prior abdominal MRI. Abdominal ultrasound 05/08/2020. FINDINGS: Comment: Today's study is limited for detection and characterization of visceral and/or vascular lesions by lack of IV gadolinium. Lower chest: Trace left pleural effusion. Hepatobiliary: Mild heterogeneous loss of signal intensity throughout the hepatic parenchyma on in phase dual echo images, suggesting heterogeneous hepatic iron deposition. This correlates to diffuse T2 hypointensity throughout the hepatic parenchyma. No discrete cystic or solid hepatic lesions are confidently identified on today's noncontrast examination. MRCP images demonstrate mild dilatation of the common bile duct which measures up to 9 mm in the porta hepatis, which could be age related. Notably, in the common hepatic duct there is a focal contour abnormality best appreciated on MRCP image 30 of series 12, which is poorly evaluated on today's noncontrast examination, but favored to be related to extrinsic compression from an adjacent vascular structure such as the common hepatic artery. No filling defect otherwise identified within the lumen of the common bile duct to suggest choledocholithiasis. There is some amorphous dependent material in the gallbladder suggesting biliary sludge. Gallbladder is moderately distended. No gallbladder wall thickening or  pericholecystic fluid. Pancreas: No pancreatic mass or peripancreatic fluid collections or inflammatory changes noted on today's noncontrast examination. MRCP images demonstrate no pancreatic ductal dilatation. Spleen:  Unremarkable. Adrenals/Urinary Tract: In the lower pole of the left kidney there is a 1.3 cm T1 hypointense and T2 hyperintense lesion which is incompletely characterized on today's non-contrast examination, but favored to represent a cyst. Right kidney and bilateral adrenal glands are unremarkable in appearance. No hydroureteronephrosis in the visualized portions of the abdomen. Stomach/Bowel: Visualized portions are unremarkable. Vascular/Lymphatic: Aortic atherosclerosis, without evidence of aneurysm in the abdominal vasculature. Other: No significant volume of ascites noted in the visualized portions of the peritoneal cavity. Musculoskeletal: No aggressive appearing osseous lesions are noted in the visualized portions of the skeleton. IMPRESSION: 1. Evidence of biliary sludge in the gallbladder, without evidence of acute cholecystitis. 2. Common bile duct is mildly dilated measuring 9 mm in the porta hepatis, however, this is likely within normal limits given the patient's advanced age. No choledocholithiasis. Additionally, there is no evidence of intrahepatic biliary ductal dilatation to suggest significant biliary tract obstruction. 3. Findings are suggestive of heterogeneous iron deposition in the liver, as discussed above. 4. Eccentric filling defect in the common hepatic duct which is favored to be related to extrinsic compression from an adjacent vascular structure, but is poorly evaluated on today's noncontrast examination. This could be more definitively evaluated with repeat abdominal MRI with and without IV gadolinium with MRCP if of clinical concern. 5. Aortic atherosclerosis. 6. Additional incidental findings, as above. Electronically Signed   By: Vinnie Langton M.D.   On:  05/08/2020 20:44   US Abdomen Limited RUQ  Result Date: 05/08/2020 CLINICAL DATA:  Elevated LFTs, positive blood cultures, sludge seen on yesterday exam, continued  RIGHT upper quadrant pain question acute cholecystitis EXAM: ULTRASOUND ABDOMEN LIMITED RIGHT UPPER QUADRANT COMPARISON:  05/07/2020 FINDINGS: Gallbladder: Significant sludge in gallbladder. No definite shadowing calculi. Minimal gallbladder wall thickening. No pericholecystic fluid or sonographic Murphy sign. Common bile duct: Diameter: 9 mm, increased from 4 mm yesterday Liver: Normal echogenicity without mass or nodularity. Mild central intrahepatic biliary dilatation new since previous exam. Portal vein is patent on color Doppler imaging with normal direction of blood flow towards the liver. Other: No RIGHT upper quadrant free fluid. IMPRESSION: Sludge within gallbladder with minimal gallbladder wall thickening but no definite calculi visualized. Interval increase in diameter of CBD now 9 mm (4 mm yesterday) with associated mild intrahepatic biliary dilatation centrally; CBD obstruction not excluded and in the setting of elevated LFTs, consider MRCP imaging. Electronically Signed   By: Lavonia Dana M.D.   On: 05/08/2020 16:30    Lab Data:  CBC: Recent Labs  Lab 05/07/20 1417 05/08/20 1231 05/09/20 0525 05/10/20 0522  WBC 1.3* 13.7* 8.9 5.6  NEUTROABS 1.1* 12.6*  --   --   HGB 11.2* 10.5* 9.9* 9.7*  HCT 35.5* 32.6* 30.9* 29.6*  MCV 104.4* 103.8* 103.3* 101.4*  PLT 284 265 230 166   Basic Metabolic Panel: Recent Labs  Lab 05/07/20 1417 05/08/20 1231 05/09/20 0525 05/10/20 0522  NA 140 136 137 136  K 4.7 4.8 5.0 4.3  CL 106 103 109 107  CO2 20* 17* 16* 17*  GLUCOSE 108* 78 40* 102*  BUN 73* 73* 82* 75*  CREATININE 3.90* 3.89* 4.19* 4.03*  CALCIUM 9.5 8.9 8.4* 7.9*   GFR: Estimated Creatinine Clearance: 7.3 mL/min (A) (by C-G formula based on SCr of 4.03 mg/dL (H)). Liver Function Tests: Recent Labs  Lab  05/07/20 1417 05/08/20 1231 05/09/20 0525 05/10/20 0522  AST 58* 208* 105* 55*  ALT 31 127* 82* 59*  ALKPHOS 95 111 97 93  BILITOT 1.9* 1.7* 1.6* 1.1  PROT 6.9 7.0 6.0* 6.0*  ALBUMIN 3.7 3.7 3.1* 2.9*   Recent Labs  Lab 05/07/20 1417 05/08/20 1231  LIPASE 64* 22   No results for input(s): AMMONIA in the last 168 hours. Coagulation Profile: No results for input(s): INR, PROTIME in the last 168 hours. Cardiac Enzymes: No results for input(s): CKTOTAL, CKMB, CKMBINDEX, TROPONINI in the last 168 hours. BNP (last 3 results) No results for input(s): PROBNP in the last 8760 hours. HbA1C: No results for input(s): HGBA1C in the last 72 hours. CBG: Recent Labs  Lab 05/09/20 0833 05/09/20 1142  GLUCAP 51* 105*   Lipid Profile: No results for input(s): CHOL, HDL, LDLCALC, TRIG, CHOLHDL, LDLDIRECT in the last 72 hours. Thyroid Function Tests: Recent Labs    05/09/20 0525  TSH 2.342   Anemia Panel: No results for input(s): VITAMINB12, FOLATE, FERRITIN, TIBC, IRON, RETICCTPCT in the last 72 hours. Urine analysis:    Component Value Date/Time   COLORURINE YELLOW 05/07/2020 2138   APPEARANCEUR CLEAR 05/07/2020 2138   LABSPEC 1.012 05/07/2020 2138   PHURINE 5.0 05/07/2020 2138   GLUCOSEU NEGATIVE 05/07/2020 2138   HGBUR NEGATIVE 05/07/2020 2138   BILIRUBINUR NEGATIVE 05/07/2020 2138   [place] NEGATIVE 05/07/2020 2138   PROTEINUR 100 (A) 05/07/2020 2138   NITRITE NEGATIVE 05/07/2020 2138   LEUKOCYTESUR NEGATIVE 05/07/2020 2138     Liridona Mashaw M.D. [facility] 05/10/2020, 1:08 PM   Call night coverage person covering after 7pm

## 2020-05-11 DIAGNOSIS — N183 Chronic kidney disease, stage 3 unspecified: Secondary | ICD-10-CM

## 2020-05-11 LAB — COMPREHENSIVE METABOLIC PANEL
ALT: 46 U/L — ABNORMAL HIGH (ref 0–44)
AST: 35 U/L (ref 15–41)
Albumin: 3 g/dL — ABNORMAL LOW (ref 3.5–5.0)
Alkaline Phosphatase: 89 U/L (ref 38–126)
Anion gap: 12 (ref 5–15)
BUN: 61 mg/dL — ABNORMAL HIGH (ref 8–23)
CO2: 14 mmol/L — ABNORMAL LOW (ref 22–32)
Calcium: 8.1 mg/dL — ABNORMAL LOW (ref 8.9–10.3)
Chloride: 110 mmol/L (ref 98–111)
Creatinine, Ser: 3.63 mg/dL — ABNORMAL HIGH (ref 0.44–1.00)
GFR calc Af Amer: 12 mL/min — ABNORMAL LOW (ref >60)
GFR calc non Af Amer: 11 mL/min — ABNORMAL LOW (ref >60)
Glucose, Bld: 89 mg/dL (ref 70–99)
Potassium: 4.1 mmol/L (ref 3.5–5.1)
Sodium: 136 mmol/L (ref 135–145)
Total Bilirubin: 1 mg/dL (ref 0.3–1.2)
Total Protein: 5.9 g/dL — ABNORMAL LOW (ref 6.5–8.1)

## 2020-05-11 LAB — CULTURE, BLOOD (ROUTINE X 2): Special Requests: ADEQUATE

## 2020-05-11 LAB — CBC
HCT: 31.2 % — ABNORMAL LOW (ref 36.0–46.0)
Hemoglobin: 10.2 g/dL — ABNORMAL LOW (ref 12.0–15.0)
MCH: 32.8 pg (ref 26.0–34.0)
MCHC: 32.7 g/dL (ref 30.0–36.0)
MCV: 100.3 fL — ABNORMAL HIGH (ref 80.0–100.0)
Platelets: 192 10*3/uL (ref 150–400)
RBC: 3.11 MIL/uL — ABNORMAL LOW (ref 3.87–5.11)
RDW: 15.1 % (ref 11.5–15.5)
WBC: 4.4 10*3/uL (ref 4.0–10.5)
nRBC: 0 % (ref 0.0–0.2)

## 2020-05-11 LAB — AMMONIA: Ammonia: 34 umol/L (ref 9–35)

## 2020-05-11 MED ORDER — SACCHAROMYCES BOULARDII 250 MG PO CAPS
250.0000 mg | ORAL_CAPSULE | Freq: Two times a day (BID) | ORAL | 0 refills | Status: AC
Start: 2020-05-11 — End: 2020-05-18

## 2020-05-11 MED ORDER — ONDANSETRON HCL 8 MG PO TABS
8.0000 mg | ORAL_TABLET | Freq: Three times a day (TID) | ORAL | 0 refills | Status: AC | PRN
Start: 2020-05-11 — End: ?

## 2020-05-11 MED ORDER — CEPHALEXIN 250 MG PO CAPS
250.0000 mg | ORAL_CAPSULE | Freq: Two times a day (BID) | ORAL | Status: DC
  Administered 2020-05-11: 250 mg via ORAL
  Filled 2020-05-11: qty 1

## 2020-05-11 MED ORDER — CEPHALEXIN 250 MG PO CAPS: 250.0000 mg | ORAL_CAPSULE | Freq: Two times a day (BID) | ORAL | 0 refills | Status: AC

## 2020-05-11 MED ORDER — URSODIOL 300 MG PO CAPS: 300.0000 mg | ORAL_CAPSULE | Freq: Two times a day (BID) | ORAL | 3 refills | Status: DC

## 2020-05-11 MED ORDER — FUROSEMIDE 20 MG PO TABS: 20.0000 mg | ORAL_TABLET | Freq: Every day | ORAL | Status: DC

## 2020-05-11 MED ORDER — POLYETHYLENE GLYCOL 3350 17 G PO PACK: 17.0000 g | PACK | Freq: Every day | ORAL | 3 refills | Status: AC | PRN

## 2020-05-11 NOTE — Discharge Summary (Signed)
Physician Discharge Summary   Patient ID: Alicia Sellers MRN: [id number] DOB/AGE: [birth year] 86 y.o.  Admit date: 05/08/2020 Discharge date: 05/11/2020  Primary Care Physician:  Gaynelle Arabian, MD   Recommendations for Outpatient Follow-up:  1. Follow up with PCP in 1-2 weeks 2. Follow BMET outpatient, outpatient GI work-up 3. Continue Keflex for 4 more days to complete 7 days treatment for E. coli bacteremia  Home Health: Patient ambulating without any difficulty Equipment/Devices:   Discharge Condition: stable CODE STATUS: FULL  Diet recommendation: Heart healthy diet   Discharge Diagnoses:    . Sepsis ([place]) due to E. coli, Klebsiella . Septic cholangitis . HTN (hypertension) . Hypothyroidism . Anemia due to chronic kidney disease . Sepsis due to Escherichia coli (E. coli) ([place]) . Transaminitis . Chronic combined systolic and diastolic CHF (congestive heart failure) ([place]) . CKD stage IV, metabolic acidosis   Consults:  Gastroenterology General surgery Nephrology     Allergies:   Allergies  Allergen Reactions  . Latex Rash  . Penicillins Rash    DID THE REACTION INVOLVE: Swelling of the face/tongue/throat, SOB, or low BP? No Sudden or severe rash/hives, skin peeling, or the inside of the mouth or nose? No Did it require medical treatment? No When did it last happen? If all above answers are "NO", may proceed with cephalosporin use.     DISCHARGE MEDICATIONS: Allergies as of 05/11/2020      Reactions   Latex Rash   Penicillins Rash   DID THE REACTION INVOLVE: Swelling of the face/tongue/throat, SOB, or low BP? No Sudden or severe rash/hives, skin peeling, or the inside of the mouth or nose? No Did it require medical treatment? No When did it last happen? If all above answers are "NO", may proceed with cephalosporin use.      Medication List    TAKE these medications   amLODipine 5 MG tablet Commonly known as: NORVASC Take 0.5  tablets (2.5 mg total) by mouth daily.   aspirin 81 MG chewable tablet Chew 81 mg by mouth at bedtime.   cephALEXin 250 MG capsule Commonly known as: KEFLEX Take 1 capsule (250 mg total) by mouth 2 (two) times daily for 4 days.   citalopram 10 MG tablet Commonly known as: CELEXA Take 10 mg by mouth daily.   Fish Oil 1000 MG Caps Take 1 capsule (1,000 mg total) by mouth 2 (two) times daily with a meal.   furosemide 20 MG tablet Commonly known as: LASIX Take 1 tablet (20 mg total) by mouth daily. Start taking on: May 13, 2020 What changed:   medication strength  how much to take  These instructions start on May 13, 2020. If you are unsure what to do until then, ask your doctor or other care provider.   gemfibrozil 600 MG tablet Commonly known as: LOPID Take 600 mg by mouth 2 (two) times daily before a meal.   hydrALAZINE 25 MG tablet Commonly known as: APRESOLINE Take 50 mg by mouth in the morning and at bedtime.   levothyroxine 50 MCG tablet Commonly known as: SYNTHROID Take 50 mcg by mouth daily before breakfast.   LORazepam 0.5 MG tablet Commonly known as: ATIVAN Take 0.25 mg by mouth at bedtime.   metoprolol succinate 25 MG 24 hr tablet Commonly known as: TOPROL-XL Take 12.5 mg by mouth every evening.   ondansetron 8 MG tablet Commonly known as: Zofran Take 1 tablet (8 mg total) by mouth every 8 (eight) hours as needed for nausea  or vomiting.   polyethylene glycol 17 g packet Commonly known as: MIRALAX / GLYCOLAX Take 17 g by mouth daily as needed for mild constipation or moderate constipation. What changed:   when to take this  reasons to take this   Red Yeast Rice 600 MG Tabs Take 600 tablets by mouth 2 (two) times daily.   saccharomyces boulardii 250 MG capsule Commonly known as: Florastor Take 1 capsule (250 mg total) by mouth 2 (two) times daily for 7 days.   sodium bicarbonate 650 MG tablet Take 650 mg by mouth daily.   ursodiol 300 MG  capsule Commonly known as: ACTIGALL Take 1 capsule (300 mg total) by mouth 2 (two) times daily.   vitamin E 180 MG (400 UNITS) capsule Take 400 Units by mouth daily.        Brief H and P: For complete details please refer to admission H and P, but in brief Patient is a 86 year old female with history of chronic systolic CHF, EF 20 to 60%, grade 1 diastolic [id number](5I echo 10/2019, primary cardiologist, Dr. Doylene Canard), hypertension, hyperlipidemia, CKD stage IV, hypothyroidism initially presented to ED on 05/07/2020 for nausea vomiting, abdominal pain and diarrhea.  No hematemesis, hematochezia or melena. She described the abdominal pain as 9/10, intermittent and aching. The pain worsened after she ate food. Subsequently she started having chest pain and she got concerned and called 911. No fevers or chills., No dysuria or hematuria. Work-up in the ED on 6/1 with CT abdomen showed no diverticulitis, distention of the gallbladder, no visible stones, mild transaminitis, neutropenia with white count of 1.3. Creatinine close to her baseline 3.9. Patient subsequently felt better in ED, and was able to eat and drink water. She felt that she could manage at home hence patient was discharged from ED. Patient was called at her home on 05/08/2020 for positive blood cultures+E. Coli.   She was admitted for further work-up.  Hospital Course:  Sepsis due to Escherichia coli (E. coli) ([place]), Klebsiella, septic cholangitis, transaminitis -Likely due to GI source, most likely due to septic cholangitis.No acute cholecystitis on ultrasound or CT.  - Ultrasound abdomen showed sludge within the gallbladder with minimal gallbladder wall thickening, no definitive calculi, interval increase in CBD diameter now 9 mm (4 mm yesterday) -MRCP showed biliary sludge in the gallbladder, no acute cholecystitis, CBD mildly dilated 9 mm, likely within normal limits with patient's age, no choledocholithiasis.   Eccentric filling defect in common hepatic duct  -HIDA scan negative.  Surgery signed off, despite gallbladder sludge, high risk to justify having the gallbladder removed with negative HIDA -Tolerating diet, LFTs are improving.  No abdominal pain.  Per GI benefit of ERCP would be low at this time.  Complete 4 more days of Keflex per sensitivities to complete 7-day course of E. coli bacteremia treatment Outpatient follow-up with GI   HTN (hypertension) -Continue metoprolol, amlodipine -Lasix was held due to sepsis, dehydration -Patient was placed on IV fluid hydration, creatinine improving 3.6 Patient recommended to continue to hold Lasix for another couple of days, encourage p.o. diet.  Hypothyroidism -Continue oral Synthroid -TSH 2.3  Anemia due to chronic kidney disease -Baseline 9 -11 -H&H stable  Chronic combinedsystolicand diastolicCHF (congestive heart failure) ([facility]) -2D echo repeated, EF 30 to 62%, grade 1 diastolic dysfunction (improved from previous echo EF 20 to 25% in 10/2019) -Was volume depleted at the time of admission due to vomiting and diarrhea Patient was placed on gentle hydration, Lasix was held, may resume  in 2 days Patient was followed by Dr. Doylene Canard during hospitalization.  Acute on CKD stage IV, AGmetabolic acidosis -Baseline Cr 4.2-4.5 -Creatinine now improving, 3.6, nephrology was consulted, restarted sodium bicarb, patient needed gentle IV fluid hydration     Day of Discharge S: No acute complaints, ambulating in the room.  Hoping to go home today.  BP (!) 158/64 (BP Location: Left Arm)   Pulse 83   Temp 98.8 F (37.1 C) (Oral)   Resp 20   Ht 5\' 1"  (1.549 m)   Wt 49.9 kg   SpO2 95%   BMI 20.78 kg/m   Physical Exam: General: Alert and awake oriented x3 not in any acute distress. HEENT: anicteric sclera, pupils reactive to light and accommodation CVS: S1-S2 clear no murmur rubs or gallops Chest: clear to auscultation  bilaterally, no wheezing rales or rhonchi Abdomen: soft nontender, nondistended, normal bowel sounds Extremities: no cyanosis, clubbing or edema noted bilaterally Neuro: Cranial nerves II-XII intact, no focal neurological deficits    Get Medicines reviewed and adjusted: Please take all your medications with you for your next visit with your Primary MD  Please request your Primary MD to go over all hospital tests and procedure/radiological results at the follow up. Please ask your Primary MD to get all Hospital records sent to his/her office.  If you experience worsening of your admission symptoms, develop shortness of breath, life threatening emergency, suicidal or homicidal thoughts you must seek medical attention immediately by calling 911 or calling your MD immediately  if symptoms less severe.  You must read complete instructions/literature along with all the possible adverse reactions/side effects for all the Medicines you take and that have been prescribed to you. Take any new Medicines after you have completely understood and accept all the possible adverse reactions/side effects.   Do not drive when taking pain medications.   Do not take more than prescribed Pain, Sleep and Anxiety Medications  Special Instructions: If you have smoked or chewed Tobacco  in the last 2 yrs please stop smoking, stop any regular Alcohol  and or any Recreational drug use.  Wear Seat belts while driving.  Please note  You were cared for by a hospitalist during your hospital stay. Once you are discharged, your primary care physician will handle any further medical issues. Please note that NO REFILLS for any discharge medications will be authorized once you are discharged, as it is imperative that you return to your primary care physician (or establish a relationship with a primary care physician if you do not have one) for your aftercare needs so that they can reassess your need for medications and monitor  your lab values.   The results of significant diagnostics from this hospitalization (including imaging, microbiology, ancillary and laboratory) are listed below for reference.      Procedures/Studies:  CT ABDOMEN PELVIS WO CONTRAST  Result Date: 05/07/2020 CLINICAL DATA:  Nausea, vomiting EXAM: CT ABDOMEN AND PELVIS WITHOUT CONTRAST TECHNIQUE: Multidetector CT imaging of the abdomen and pelvis was performed following the standard protocol without IV contrast. COMPARISON:  Ultrasound 07/22/2018 FINDINGS: Lower chest: Bibasilar atelectasis or scarring. No acute abnormality. Hepatobiliary: Gallbladder is distended. No visible stones. Suspect mild intrahepatic biliary ductal dilatation. Common bile duct appears to be normal caliber. Pancreas: No focal abnormality or ductal dilatation. Spleen: No focal abnormality.  Normal size. Adrenals/Urinary Tract: Renovascular calcifications within the renal hila bilaterally. No urinary tract stones or hydronephrosis. Adrenal glands and urinary bladder unremarkable. Stomach/Bowel: Diffuse colonic diverticulosis.  No active diverticulitis. Stomach and small bowel decompressed, unremarkable. Vascular/Lymphatic: Aortic atherosclerosis. No enlarged abdominal or pelvic lymph nodes. Reproductive: Uterus and adnexa unremarkable.  No mass. Other: No free fluid or free air. Musculoskeletal: No acute bony abnormality. IMPRESSION: Diffuse colonic diverticulosis. No inflammatory changes to suggest active diverticulitis. Distention of the gallbladder. There appears to be mild intrahepatic biliary ductal dilatation. No visible stones. This could be further evaluated with right upper quadrant ultrasound if felt clinically indicated. Aortic atherosclerosis. Electronically Signed   By: Rolm Baptise M.D.   On: 05/07/2020 18:34   DG Chest 2 View  Result Date: 05/08/2020 CLINICAL DATA:  Fever. EXAM: CHEST - 2 VIEW COMPARISON:  December 13, 2019. FINDINGS: Stable cardiomegaly. Atherosclerosis  of thoracic aorta is noted. No pneumothorax or pleural effusion is noted. No acute pulmonary disease is noted. Bony thorax is unremarkable. IMPRESSION: No active cardiopulmonary disease. Aortic atherosclerosis. Electronically Signed   By: Marijo Conception M.D.   On: 05/08/2020 12:41   NM Hepatobiliary Liver Func  Result Date: 05/09/2020 CLINICAL DATA:  Nausea and vomiting. Biliary sludge. Right upper quadrant pain EXAM: NUCLEAR MEDICINE HEPATOBILIARY IMAGING TECHNIQUE: Sequential images of the abdomen were obtained out to 60 minutes following intravenous administration of radiopharmaceutical. RADIOPHARMACEUTICALS:  4.75 mCi Tc-68m  Choletec IV COMPARISON:  Ultrasound 05/08/2020, MRCP 05/08/2020 FINDINGS: Prompt uptake and biliary excretion of activity by the liver is seen. Gallbladder activity is visualized, consistent with patency of cystic duct. Biliary activity passes into small bowel, consistent with patent common bile duct. IMPRESSION: Negative hepatobiliary scan with patency of the cystic duct and common bile duct. Electronically Signed   By: Davina Poke D.O.   On: 05/09/2020 14:04   US Abdomen Complete  Result Date: 05/07/2020 CLINICAL DATA:  Upper abdominal pain for 2 days EXAM: ABDOMEN ULTRASOUND COMPLETE COMPARISON:  None. FINDINGS: Gallbladder: Layering hyperdense sludge is seen. No sonographic Murphy sign noted by sonographer. No gallbladder wall thickening. Common bile duct: Diameter: 4 mm Liver: No focal lesion identified. Within normal limits in parenchymal echogenicity. Portal vein is patent on color Doppler imaging with normal direction of blood flow towards the liver. IVC: No abnormality visualized. Pancreas: Visualized portion unremarkable. Spleen: Size and appearance within normal limits. Right Kidney: Length: 8.3 cm. There is increased echogenicity seen throughout the right kidney. Trace free fluid seen around the right kidney. Left Kidney: Length: 7.8 cm. Echogenicity within normal  limits. There is a complex anechoic cyst seen within the lower pole measuring 1.4 x 1.3 x 1.3 cm. Abdominal aorta: Limited due to overlying bowel gas Other findings: None. IMPRESSION: Layering hyperdense sludge.  No evidence of acute cholecystitis. Electronically Signed   By: Prudencio Pair M.D.   On: 05/07/2020 23:05   MR ABDOMEN MRCP WO CONTRAST  Result Date: 05/08/2020 CLINICAL DATA:  86 year old female with history of acute nonlocalized abdominal pain. Emesis. EXAM: MRI ABDOMEN WITHOUT CONTRAST  (INCLUDING MRCP) TECHNIQUE: Multiplanar multisequence MR imaging of the abdomen was performed. Heavily T2-weighted images of the biliary and pancreatic ducts were obtained, and three-dimensional MRCP images were rendered by post processing. COMPARISON:  No prior abdominal MRI. Abdominal ultrasound 05/08/2020. FINDINGS: Comment: Today's study is limited for detection and characterization of visceral and/or vascular lesions by lack of IV gadolinium. Lower chest: Trace left pleural effusion. Hepatobiliary: Mild heterogeneous loss of signal intensity throughout the hepatic parenchyma on in phase dual echo images, suggesting heterogeneous hepatic iron deposition. This correlates to diffuse T2 hypointensity throughout the hepatic parenchyma. No discrete cystic or  solid hepatic lesions are confidently identified on today's noncontrast examination. MRCP images demonstrate mild dilatation of the common bile duct which measures up to 9 mm in the porta hepatis, which could be age related. Notably, in the common hepatic duct there is a focal contour abnormality best appreciated on MRCP image 30 of series 12, which is poorly evaluated on today's noncontrast examination, but favored to be related to extrinsic compression from an adjacent vascular structure such as the common hepatic artery. No filling defect otherwise identified within the lumen of the common bile duct to suggest choledocholithiasis. There is some amorphous dependent  material in the gallbladder suggesting biliary sludge. Gallbladder is moderately distended. No gallbladder wall thickening or pericholecystic fluid. Pancreas: No pancreatic mass or peripancreatic fluid collections or inflammatory changes noted on today's noncontrast examination. MRCP images demonstrate no pancreatic ductal dilatation. Spleen:  Unremarkable. Adrenals/Urinary Tract: In the lower pole of the left kidney there is a 1.3 cm T1 hypointense and T2 hyperintense lesion which is incompletely characterized on today's non-contrast examination, but favored to represent a cyst. Right kidney and bilateral adrenal glands are unremarkable in appearance. No hydroureteronephrosis in the visualized portions of the abdomen. Stomach/Bowel: Visualized portions are unremarkable. Vascular/Lymphatic: Aortic atherosclerosis, without evidence of aneurysm in the abdominal vasculature. Other: No significant volume of ascites noted in the visualized portions of the peritoneal cavity. Musculoskeletal: No aggressive appearing osseous lesions are noted in the visualized portions of the skeleton. IMPRESSION: 1. Evidence of biliary sludge in the gallbladder, without evidence of acute cholecystitis. 2. Common bile duct is mildly dilated measuring 9 mm in the porta hepatis, however, this is likely within normal limits given the patient's advanced age. No choledocholithiasis. Additionally, there is no evidence of intrahepatic biliary ductal dilatation to suggest significant biliary tract obstruction. 3. Findings are suggestive of heterogeneous iron deposition in the liver, as discussed above. 4. Eccentric filling defect in the common hepatic duct which is favored to be related to extrinsic compression from an adjacent vascular structure, but is poorly evaluated on today's noncontrast examination. This could be more definitively evaluated with repeat abdominal MRI with and without IV gadolinium with MRCP if of clinical concern. 5. Aortic  atherosclerosis. 6. Additional incidental findings, as above. Electronically Signed   By: Vinnie Langton M.D.   On: 05/08/2020 20:44   MR 3D Recon At Scanner  Result Date: 05/08/2020 CLINICAL DATA:  86 year old female with history of acute nonlocalized abdominal pain. Emesis. EXAM: MRI ABDOMEN WITHOUT CONTRAST  (INCLUDING MRCP) TECHNIQUE: Multiplanar multisequence MR imaging of the abdomen was performed. Heavily T2-weighted images of the biliary and pancreatic ducts were obtained, and three-dimensional MRCP images were rendered by post processing. COMPARISON:  No prior abdominal MRI. Abdominal ultrasound 05/08/2020. FINDINGS: Comment: Today's study is limited for detection and characterization of visceral and/or vascular lesions by lack of IV gadolinium. Lower chest: Trace left pleural effusion. Hepatobiliary: Mild heterogeneous loss of signal intensity throughout the hepatic parenchyma on in phase dual echo images, suggesting heterogeneous hepatic iron deposition. This correlates to diffuse T2 hypointensity throughout the hepatic parenchyma. No discrete cystic or solid hepatic lesions are confidently identified on today's noncontrast examination. MRCP images demonstrate mild dilatation of the common bile duct which measures up to 9 mm in the porta hepatis, which could be age related. Notably, in the common hepatic duct there is a focal contour abnormality best appreciated on MRCP image 30 of series 12, which is poorly evaluated on today's noncontrast examination, but favored to be related to  extrinsic compression from an adjacent vascular structure such as the common hepatic artery. No filling defect otherwise identified within the lumen of the common bile duct to suggest choledocholithiasis. There is some amorphous dependent material in the gallbladder suggesting biliary sludge. Gallbladder is moderately distended. No gallbladder wall thickening or pericholecystic fluid. Pancreas: No pancreatic mass or  peripancreatic fluid collections or inflammatory changes noted on today's noncontrast examination. MRCP images demonstrate no pancreatic ductal dilatation. Spleen:  Unremarkable. Adrenals/Urinary Tract: In the lower pole of the left kidney there is a 1.3 cm T1 hypointense and T2 hyperintense lesion which is incompletely characterized on today's non-contrast examination, but favored to represent a cyst. Right kidney and bilateral adrenal glands are unremarkable in appearance. No hydroureteronephrosis in the visualized portions of the abdomen. Stomach/Bowel: Visualized portions are unremarkable. Vascular/Lymphatic: Aortic atherosclerosis, without evidence of aneurysm in the abdominal vasculature. Other: No significant volume of ascites noted in the visualized portions of the peritoneal cavity. Musculoskeletal: No aggressive appearing osseous lesions are noted in the visualized portions of the skeleton. IMPRESSION: 1. Evidence of biliary sludge in the gallbladder, without evidence of acute cholecystitis. 2. Common bile duct is mildly dilated measuring 9 mm in the porta hepatis, however, this is likely within normal limits given the patient's advanced age. No choledocholithiasis. Additionally, there is no evidence of intrahepatic biliary ductal dilatation to suggest significant biliary tract obstruction. 3. Findings are suggestive of heterogeneous iron deposition in the liver, as discussed above. 4. Eccentric filling defect in the common hepatic duct which is favored to be related to extrinsic compression from an adjacent vascular structure, but is poorly evaluated on today's noncontrast examination. This could be more definitively evaluated with repeat abdominal MRI with and without IV gadolinium with MRCP if of clinical concern. 5. Aortic atherosclerosis. 6. Additional incidental findings, as above. Electronically Signed   By: Vinnie Langton M.D.   On: 05/08/2020 20:44   ECHOCARDIOGRAM COMPLETE  Result Date:  05/10/2020    ECHOCARDIOGRAM REPORT   Patient Name:   [facility] Date of Exam: 05/10/2020 Medical Rec #:  [id number]         Height:       61.0 in Accession #:    [id number]        Weight:       110.0 lb Date of Birth:  July 01, 1929         BSA:          1.465 m Patient Age:    37 years          BP:           171/61 mmHg Patient Gender: F                 HR:           63 bpm. Exam Location:  Inpatient Procedure: 2D Echo Indications:     Cardiomyopathy- Unspecified I42.9  History:         Patient has prior history of Echocardiogram examinations, most                  recent 10/27/2019. CHF; Risk Factors:Hypertension.  Sonographer:     Mikki Santee RDCS (AE) Referring Phys:  [place] Diagnosing Phys: [name] MD IMPRESSIONS  1. Left ventricular ejection fraction, by estimation, is 30 to 35%. The left ventricle has moderately decreased function. The left ventricle demonstrates global hypokinesis. Left ventricular diastolic parameters are consistent with Grade I diastolic dysfunction (impaired relaxation).  2.  Right ventricular systolic function is mildly reduced. The right ventricular size is normal. There is moderately elevated pulmonary artery systolic pressure.  3. Left atrial size was moderately dilated.  4. Right atrial size was mildly dilated.  5. The mitral valve is degenerative. Moderate to severe mitral valve regurgitation.  6. Severe TR with multiple jets. Tricuspid valve regurgitation is severe.  7. The aortic valve is tricuspid. Aortic valve regurgitation is mild. Mild to moderate aortic valve sclerosis/calcification is present, without any evidence of aortic stenosis.  8. Pulmonic valve regurgitation is moderate.  9. There is mild (Grade II) atheroma plaque involving the aortic root and ascending aorta. 10. The inferior vena cava is dilated in size with <50% respiratory variability, suggesting right atrial pressure of 15 mmHg. Comparison(s): Changes from prior study are noted. Mildly  improved LV systolic function. FINDINGS  Left Ventricle: Left ventricular ejection fraction, by estimation, is 30 to 35%. The left ventricle has moderately decreased function. The left ventricle demonstrates global hypokinesis. The left ventricular internal cavity size was normal in size. There is borderline concentric left ventricular hypertrophy. Left ventricular diastolic parameters are consistent with Grade I diastolic dysfunction (impaired relaxation). Right Ventricle: The right ventricular size is normal. No increase in right ventricular wall thickness. Right ventricular systolic function is mildly reduced. There is moderately elevated pulmonary artery systolic pressure. The tricuspid regurgitant velocity is 3.60 m/s, and with an assumed right atrial pressure of 8 mmHg, the estimated right ventricular systolic pressure is 62.2 mmHg. Left Atrium: Left atrial size was moderately dilated. Right Atrium: Right atrial size was mildly dilated. Pericardium: Trivial pericardial effusion is present. The pericardial effusion is circumferential. Mitral Valve: MR with multiple jets. The mitral valve is degenerative in appearance. There is mild thickening of the mitral valve leaflet(s). There is moderate calcification of the mitral valve leaflet(s). Normal mobility of the mitral valve leaflets. Moderate mitral annular calcification. Moderate to severe mitral valve regurgitation, with centrally-directed jet. Tricuspid Valve: Severe TR with multiple jets. The tricuspid valve is normal in structure. Tricuspid valve regurgitation is severe. Aortic Valve: The aortic valve is tricuspid. . There is mild thickening and moderate calcification of the aortic valve. Aortic valve regurgitation is mild. Aortic regurgitation PHT measures 672 msec. Mild to moderate aortic valve sclerosis/calcification is present, without any evidence of aortic stenosis. Moderate aortic valve annular calcification. There is mild thickening of the aortic  valve. There is moderate calcification of the aortic valve. Pulmonic Valve: The pulmonic valve was normal in structure. Pulmonic valve regurgitation is moderate. Aorta: The aortic root is normal in size and structure. There is mild (Grade II) atheroma plaque involving the aortic root and ascending aorta. Venous: The inferior vena cava is dilated in size with less than 50% respiratory variability, suggesting right atrial pressure of 15 mmHg. IAS/Shunts: No atrial level shunt detected by color flow Doppler.  LEFT VENTRICLE PLAX 2D LVIDd:         4.60 cm     Diastology LVIDs:         3.45 cm     LV e' lateral:   4.13 cm/s LV PW:         1.10 cm     LV E/e' lateral: 23.3 LV IVS:        1.10 cm     LV e' medial:    3.66 cm/s LVOT diam:     2.00 cm     LV E/e' medial:  26.3 LV SV:  66 LV SV Index:   45 LVOT Area:     3.14 cm  LV Volumes (MOD) LV vol d, MOD A2C: 99.1 ml LV vol d, MOD A4C: 91.7 ml LV vol s, MOD A2C: 61.5 ml LV vol s, MOD A4C: 56.4 ml LV SV MOD A2C:     37.6 ml LV SV MOD A4C:     91.7 ml LV SV MOD BP:      35.5 ml RIGHT VENTRICLE RV S prime:     8.17 cm/s TAPSE (M-mode): 1.9 cm LEFT ATRIUM             Index       RIGHT ATRIUM           Index LA diam:        4.20 cm 2.87 cm/m  RA Area:     14.90 cm LA Vol (A2C):   62.4 ml 42.59 ml/m RA Volume:   34.50 ml  23.55 ml/m LA Vol (A4C):   74.8 ml 51.06 ml/m LA Biplane Vol: 68.9 ml 47.03 ml/m  AORTIC VALVE LVOT Vmax:   89.70 cm/s LVOT Vmean:  59.400 cm/s LVOT VTI:    0.209 m AI PHT:      672 msec  AORTA Ao Root diam: 2.60 cm MITRAL VALVE                TRICUSPID VALVE MV Area (PHT): 4.29 cm     TR Peak grad:   51.8 mmHg MV Decel Time: 177 msec     TR Vmax:        360.00 cm/s MR Peak grad: 146.4 mmHg MR Vmax:      605.00 cm/s   SHUNTS MV E velocity: 96.10 cm/s   Systemic VTI:  0.21 m MV A velocity: 122.00 cm/s  Systemic Diam: 2.00 cm MV E/A ratio:  0.79 [name] MD Electronically signed by [name] MD Signature Date/Time: 05/10/2020/3:29:08 PM     Final    US Abdomen Limited RUQ  Result Date: 05/08/2020 CLINICAL DATA:  Elevated LFTs, positive blood cultures, sludge seen on yesterday exam, continued RIGHT upper quadrant pain question acute cholecystitis EXAM: ULTRASOUND ABDOMEN LIMITED RIGHT UPPER QUADRANT COMPARISON:  05/07/2020 FINDINGS: Gallbladder: Significant sludge in gallbladder. No definite shadowing calculi. Minimal gallbladder wall thickening. No pericholecystic fluid or sonographic Murphy sign. Common bile duct: Diameter: 9 mm, increased from 4 mm yesterday Liver: Normal echogenicity without mass or nodularity. Mild central intrahepatic biliary dilatation new since previous exam. Portal vein is patent on color Doppler imaging with normal direction of blood flow towards the liver. Other: No RIGHT upper quadrant free fluid. IMPRESSION: Sludge within gallbladder with minimal gallbladder wall thickening but no definite calculi visualized. Interval increase in diameter of CBD now 9 mm (4 mm yesterday) with associated mild intrahepatic biliary dilatation centrally; CBD obstruction not excluded and in the setting of elevated LFTs, consider MRCP imaging. Electronically Signed   By: Lavonia Dana M.D.   On: 05/08/2020 16:30       LAB RESULTS: Basic Metabolic Panel: Recent Labs  Lab 05/10/20 0522 05/11/20 0507  NA 136 136  K 4.3 4.1  CL 107 110  CO2 17* 14*  GLUCOSE 102* 89  BUN 75* 61*  CREATININE 4.03* 3.63*  CALCIUM 7.9* 8.1*   Liver Function Tests: Recent Labs  Lab 05/10/20 0522 05/11/20 0507  AST 55* 35  ALT 59* 46*  ALKPHOS 93 89  BILITOT 1.1 1.0  PROT 6.0* 5.9*  ALBUMIN 2.9* 3.0*  Recent Labs  Lab 05/07/20 1417 05/08/20 1231  LIPASE 64* 22   Recent Labs  Lab 05/11/20 0507  AMMONIA 34   CBC: Recent Labs  Lab 05/08/20 1231 05/09/20 0525 05/10/20 0522 05/10/20 0522 05/11/20 0507  WBC 13.7*   < > 5.6  --  4.4  NEUTROABS 12.6*  --   --   --   --   HGB 10.5*   < > 9.7*  --  10.2*  HCT 32.6*   < > 29.6*   --  31.2*  MCV 103.8*   < > 101.4*   < > 100.3*  PLT 265   < > 216  --  192   < > = values in this interval not displayed.   Cardiac Enzymes: No results for input(s): CKTOTAL, CKMB, CKMBINDEX, TROPONINI in the last 168 hours. BNP: Invalid input(s): POCBNP CBG: Recent Labs  Lab 05/09/20 0833 05/09/20 1142  GLUCAP 51* 105*       Disposition and Follow-up: Discharge Instructions    (HEART FAILURE PATIENTS) Call MD:  Anytime you have any of the following symptoms: 1) 3 pound weight gain in 24 hours or 5 pounds in 1 week 2) shortness of breath, with or without a dry hacking cough 3) swelling in the hands, feet or stomach 4) if you have to sleep on extra pillows at night in order to breathe.   Complete by: As directed    Diet - low sodium heart healthy   Complete by: As directed    Increase activity slowly   Complete by: As directed        DISPOSITION: Home   DISCHARGE FOLLOW-UP Follow-up Information    Gaynelle Arabian, MD. Schedule an appointment as soon as possible for a visit in 2 week(s).   Specialty: Family Medicine Contact information: [address], [place] [address] [phone]        Ronald Lobo, MD. Schedule an appointment as soon as possible for a visit in 2 week(s).   Specialty: Gastroenterology Contact information: [address] [place] [address] [phone]        [name], MD. Schedule an appointment as soon as possible for a visit in 2 week(s).   Specialty: Cardiology Contact information: [place] [address] [phone]            Time coordinating discharge:  35 minutes  Signed:   Estill Cotta M.D. [facility] 05/11/2020, 1:03 PM

## 2020-05-11 NOTE — Evaluation (Signed)
Physical Therapy Evaluation Patient Details Name: Alicia Sellers MRN: [id number] DOB: 05-12-1929 Today's Date: 05/11/2020   History of Present Illness  86 year old female with history of chronic systolic CHF, EF 20 to 40%, grade 1 diastolic dysfunction, hypertension, hyperlipidemia, CKD stage IV, hypothyroidism initially presented to ED 6/2 for nausea, vomiting, abdominal pain. abodminal US reveals gallbladder sludge, pt with sepsis suspect secondary to GI source E coli.  Clinical Impression   Pt presents with generalized weakness, mild unsteadiness in standing improved with use of AD, and decreased activity tolerance. Pt to benefit from acute PT to address deficits. Pt ambulated hallway distance with use of RW, VSS during session. Pt demonstrated HEP that she does daily with supervision of daughter or granddaughter, PT feels pt does not need PT follow up at this time given pt is very close to mobility baseline and has 24/7 assist, family and pt in agreement. PT to progress mobility as tolerated, and will continue to follow acutely.      Follow Up Recommendations No PT follow up;Supervision for mobility/OOB    Equipment Recommendations  None recommended by PT    Recommendations for Other Services       Precautions / Restrictions Precautions Precautions: Fall Restrictions Weight Bearing Restrictions: No      Mobility  Bed Mobility Overal bed mobility: Needs Assistance             General bed mobility comments: sitting EOB upon PT arrival to room, talking to daughter.  Transfers Overall transfer level: Needs assistance Equipment used: Rolling walker (2 wheeled) Transfers: Sit to/from Stand Sit to Stand: Supervision         General transfer comment: for safety, increased time to rise and reaching for RW once standing.  Ambulation/Gait Ambulation/Gait assistance: Supervision;Min guard Gait Distance (Feet): 125 Feet Assistive device: Rolling walker (2 wheeled) Gait  Pattern/deviations: Step-through pattern;Decreased stride length;Trunk flexed;Drifts right/left Gait velocity: decr   General Gait Details: Min guard, transitioning to supervision for safety. Verbal cuing for upright posture and placement in RW, as well as hallway navigation as pt tends to drift R.  Stairs            Wheelchair Mobility    Modified Rankin (Stroke Patients Only)       Balance Overall balance assessment: Needs assistance;History of Falls Sitting-balance support: No upper extremity supported;Feet supported Sitting balance-Leahy Scale: Good     Standing balance support: No upper extremity supported Standing balance-Leahy Scale: Fair Standing balance comment: able to stand statically without UE support, reliant on RW during gait                             Pertinent Vitals/Pain Pain Assessment: No/denies pain    Home Living Family/patient expects to be discharged to:: Private residence Living Arrangements: Other relatives(granddaughter) Available Help at Discharge: Family;Available 24 hours/day Type of Home: House Home Access: Ramped entrance     Home Layout: One level(one small threshold step) Home Equipment: Walker - 2 wheels;Walker - 4 wheels;Bedside commode;Grab bars - toilet;Grab bars - tub/shower;Wheelchair - manual      Prior Function Level of Independence: Needs assistance   Gait / Transfers Assistance Needed: Pt uses RW for ambulation, states she walks laps around her house for exercise when able and with supervision.  ADL's / Homemaking Assistance Needed: Pt reports having assist for bathing from daughter, uses shower seat to perform. Otherwise is able to perform ADLs independently. Pt has BSC  beside bed for nighttime toileting.        Hand Dominance   Dominant Hand: Right    Extremity/Trunk Assessment   Upper Extremity Assessment Upper Extremity Assessment: Overall WFL for tasks assessed    Lower Extremity  Assessment Lower Extremity Assessment: Generalized weakness    Cervical / Trunk Assessment Cervical / Trunk Assessment: Normal  Communication   Communication: No difficulties  Cognition Arousal/Alertness: Awake/alert Behavior During Therapy: WFL for tasks assessed/performed Overall Cognitive Status: Within Functional Limits for tasks assessed Area of Impairment: Orientation                 Orientation Level: Disoriented to;Time             General Comments: Pt states month is May, PT oriented pt to month of June. Otherwise, pt appears appropriate and answers questions well.      General Comments General comments (skin integrity, edema, etc.): VSS    Exercises Other Exercises Other Exercises: Pt demonstrates home exercises - standing hip abduction, lateral stepping, marching; PT added sit to stands with UE support if needed. Sit to stands without UE use x5   Assessment/Plan    PT Assessment Patient needs continued PT services  PT Problem List Decreased strength;Decreased mobility;Decreased activity tolerance;Decreased balance       PT Treatment Interventions DME instruction;Therapeutic activities;Gait training;Therapeutic exercise;Patient/family education;Balance training;Stair training;Functional mobility training    PT Goals (Current goals can be found in the Care Plan section)  Acute Rehab PT Goals Patient Stated Goal: go home PT Goal Formulation: With patient Time For Goal Achievement: 05/25/20 Potential to Achieve Goals: Good    Frequency Min 3X/week   Barriers to discharge        Co-evaluation               AM-PAC PT "6 Clicks" Mobility  Outcome Measure Help needed turning from your back to your side while in a flat bed without using bedrails?: None Help needed moving from lying on your back to sitting on the side of a flat bed without using bedrails?: None Help needed moving to and from a bed to a chair (including a wheelchair)?: None Help  needed standing up from a chair using your arms (e.g., wheelchair or bedside chair)?: None Help needed to walk in hospital room?: A Little Help needed climbing 3-5 steps with a railing? : A Little 6 Click Score: 22    End of Session   Activity Tolerance: Patient tolerated treatment well Patient left: in bed;with call bell/phone within reach;with family/visitor present Nurse Communication: Mobility status PT Visit Diagnosis: Other abnormalities of gait and mobility (R26.89);Muscle weakness (generalized) (M62.81)    Time: 0459-9774 PT Time Calculation (min) (ACUTE ONLY): 19 min   Charges:   PT Evaluation $PT Eval Low Complexity: 1 Low          Zelig Gacek E, PT Acute Rehabilitation Services Pager [phone]  Office [phone]  Ramaya Guile D Elonda Husky 05/11/2020, 10:34 AM

## 2020-05-11 NOTE — Progress Notes (Signed)
Pt being discharged to home with daughter. Discharge information and medication education provided to pt.

## 2020-05-12 LAB — CULTURE, BLOOD (ROUTINE X 2)

## 2020-05-13 LAB — CULTURE, BLOOD (ROUTINE X 2): Special Requests: ADEQUATE

## 2020-05-15 ENCOUNTER — Ambulatory Visit (HOSPITAL_COMMUNITY)
Admission: RE | Admit: 2020-05-15 | Discharge: 2020-05-15 | Disposition: A | Discharge status: Home / Self Care | Payer: Medicare HMO | Source: Ambulatory Visit | Attending: Nephrology | Admitting: Nephrology

## 2020-05-15 ENCOUNTER — Other Ambulatory Visit: Payer: Self-pay

## 2020-05-15 VITALS — BP 152/57 | HR 63 | Temp 96.8°F | Resp 18

## 2020-05-15 DIAGNOSIS — D631 Anemia in chronic kidney disease: Secondary | ICD-10-CM | POA: Diagnosis present

## 2020-05-15 DIAGNOSIS — N184 Chronic kidney disease, stage 4 (severe): Secondary | ICD-10-CM | POA: Diagnosis not present

## 2020-05-15 LAB — IRON AND TIBC
Iron: 113 ug/dL (ref 28–170)
Saturation Ratios: 51 % — ABNORMAL HIGH (ref 10.4–31.8)
TIBC: 223 ug/dL — ABNORMAL LOW (ref 250–450)
UIBC: 110 ug/dL

## 2020-05-15 LAB — FERRITIN: Ferritin: 306 ng/mL (ref 11–307)

## 2020-05-15 LAB — POCT HEMOGLOBIN-HEMACUE: Hemoglobin: 10.9 g/dL — ABNORMAL LOW (ref 12.0–15.0)

## 2020-05-15 MED ORDER — EPOETIN ALFA-EPBX 10000 UNIT/ML IJ SOLN
INTRAMUSCULAR | Status: AC
  Filled 2020-05-15: qty 2

## 2020-05-15 MED ORDER — EPOETIN ALFA-EPBX 10000 UNIT/ML IJ SOLN
20000.0000 [IU] | INTRAMUSCULAR | Status: DC
  Administered 2020-05-15: 20000 [IU] via SUBCUTANEOUS

## 2020-05-27 DIAGNOSIS — K828 Other specified diseases of gallbladder: Secondary | ICD-10-CM | POA: Diagnosis not present

## 2020-05-27 DIAGNOSIS — R7401 Elevation of levels of liver transaminase levels: Secondary | ICD-10-CM | POA: Diagnosis not present

## 2020-05-27 DIAGNOSIS — N189 Chronic kidney disease, unspecified: Secondary | ICD-10-CM | POA: Diagnosis not present

## 2020-05-27 DIAGNOSIS — N2581 Secondary hyperparathyroidism of renal origin: Secondary | ICD-10-CM | POA: Diagnosis not present

## 2020-05-27 DIAGNOSIS — K5901 Slow transit constipation: Secondary | ICD-10-CM | POA: Diagnosis not present

## 2020-05-27 DIAGNOSIS — E785 Hyperlipidemia, unspecified: Secondary | ICD-10-CM | POA: Diagnosis not present

## 2020-05-27 DIAGNOSIS — I129 Hypertensive chronic kidney disease with stage 1 through stage 4 chronic kidney disease, or unspecified chronic kidney disease: Secondary | ICD-10-CM | POA: Diagnosis not present

## 2020-05-27 DIAGNOSIS — N184 Chronic kidney disease, stage 4 (severe): Secondary | ICD-10-CM | POA: Diagnosis not present

## 2020-05-27 DIAGNOSIS — D519 Vitamin B12 deficiency anemia, unspecified: Secondary | ICD-10-CM | POA: Diagnosis not present

## 2020-05-27 DIAGNOSIS — E039 Hypothyroidism, unspecified: Secondary | ICD-10-CM | POA: Diagnosis not present

## 2020-05-28 ENCOUNTER — Other Ambulatory Visit: Payer: Self-pay | Admitting: Gastroenterology

## 2020-05-28 DIAGNOSIS — K828 Other specified diseases of gallbladder: Secondary | ICD-10-CM

## 2020-05-29 ENCOUNTER — Other Ambulatory Visit: Payer: Self-pay

## 2020-05-29 ENCOUNTER — Ambulatory Visit (HOSPITAL_COMMUNITY)
Admission: RE | Admit: 2020-05-29 | Discharge: 2020-05-29 | Disposition: A | Discharge status: Home / Self Care | Payer: Medicare HMO | Source: Ambulatory Visit | Attending: Nephrology | Admitting: Nephrology

## 2020-05-29 VITALS — BP 180/63 | HR 56 | Temp 96.7°F

## 2020-05-29 DIAGNOSIS — D631 Anemia in chronic kidney disease: Secondary | ICD-10-CM | POA: Insufficient documentation

## 2020-05-29 DIAGNOSIS — I5022 Chronic systolic (congestive) heart failure: Secondary | ICD-10-CM | POA: Diagnosis not present

## 2020-05-29 DIAGNOSIS — N184 Chronic kidney disease, stage 4 (severe): Secondary | ICD-10-CM | POA: Insufficient documentation

## 2020-05-29 DIAGNOSIS — E034 Atrophy of thyroid (acquired): Secondary | ICD-10-CM | POA: Diagnosis not present

## 2020-05-29 DIAGNOSIS — R0602 Shortness of breath: Secondary | ICD-10-CM | POA: Diagnosis not present

## 2020-05-29 LAB — POCT HEMOGLOBIN-HEMACUE: Hemoglobin: 11.2 g/dL — ABNORMAL LOW (ref 12.0–15.0)

## 2020-05-29 MED ORDER — EPOETIN ALFA-EPBX 10000 UNIT/ML IJ SOLN
INTRAMUSCULAR | Status: AC
  Filled 2020-05-29: qty 2

## 2020-05-29 MED ORDER — EPOETIN ALFA-EPBX 10000 UNIT/ML IJ SOLN
20000.0000 [IU] | INTRAMUSCULAR | Status: DC
  Administered 2020-05-29: 20000 [IU] via SUBCUTANEOUS

## 2020-06-02 DIAGNOSIS — M1990 Unspecified osteoarthritis, unspecified site: Secondary | ICD-10-CM | POA: Diagnosis not present

## 2020-06-02 DIAGNOSIS — I509 Heart failure, unspecified: Secondary | ICD-10-CM | POA: Diagnosis not present

## 2020-06-02 DIAGNOSIS — I130 Hypertensive heart and chronic kidney disease with heart failure and stage 1 through stage 4 chronic kidney disease, or unspecified chronic kidney disease: Secondary | ICD-10-CM | POA: Diagnosis not present

## 2020-06-02 DIAGNOSIS — E7800 Pure hypercholesterolemia, unspecified: Secondary | ICD-10-CM | POA: Diagnosis not present

## 2020-06-02 DIAGNOSIS — I129 Hypertensive chronic kidney disease with stage 1 through stage 4 chronic kidney disease, or unspecified chronic kidney disease: Secondary | ICD-10-CM | POA: Diagnosis not present

## 2020-06-02 DIAGNOSIS — E039 Hypothyroidism, unspecified: Secondary | ICD-10-CM | POA: Diagnosis not present

## 2020-06-02 DIAGNOSIS — M810 Age-related osteoporosis without current pathological fracture: Secondary | ICD-10-CM | POA: Diagnosis not present

## 2020-06-02 DIAGNOSIS — I10 Essential (primary) hypertension: Secondary | ICD-10-CM | POA: Diagnosis not present

## 2020-06-02 DIAGNOSIS — N184 Chronic kidney disease, stage 4 (severe): Secondary | ICD-10-CM | POA: Diagnosis not present

## 2020-06-12 ENCOUNTER — Other Ambulatory Visit: Payer: Self-pay

## 2020-06-12 ENCOUNTER — Encounter (HOSPITAL_COMMUNITY)
Admission: RE | Admit: 2020-06-12 | Discharge: 2020-06-12 | Disposition: A | Discharge status: Home / Self Care | Payer: Medicare HMO | Source: Ambulatory Visit | Attending: Nephrology | Admitting: Nephrology

## 2020-06-12 VITALS — BP 184/47 | HR 55 | Temp 97.3°F | Resp 20

## 2020-06-12 DIAGNOSIS — N184 Chronic kidney disease, stage 4 (severe): Secondary | ICD-10-CM

## 2020-06-12 DIAGNOSIS — D631 Anemia in chronic kidney disease: Secondary | ICD-10-CM | POA: Insufficient documentation

## 2020-06-12 LAB — IRON AND TIBC
Iron: 121 ug/dL (ref 28–170)
Saturation Ratios: 42 % — ABNORMAL HIGH (ref 10.4–31.8)
TIBC: 288 ug/dL (ref 250–450)
UIBC: 167 ug/dL

## 2020-06-12 LAB — POCT HEMOGLOBIN-HEMACUE: Hemoglobin: 11.4 g/dL — ABNORMAL LOW (ref 12.0–15.0)

## 2020-06-12 LAB — FERRITIN: Ferritin: 193 ng/mL (ref 11–307)

## 2020-06-12 MED ORDER — EPOETIN ALFA-EPBX 10000 UNIT/ML IJ SOLN
20000.0000 [IU] | INTRAMUSCULAR | Status: DC
  Administered 2020-06-12: 20000 [IU] via SUBCUTANEOUS

## 2020-06-12 MED ORDER — CLONIDINE HCL 0.1 MG PO TABS
ORAL_TABLET | ORAL | Status: AC
  Administered 2020-06-12: 0.1 mg
  Filled 2020-06-12: qty 1

## 2020-06-12 MED ORDER — EPOETIN ALFA-EPBX 10000 UNIT/ML IJ SOLN
INTRAMUSCULAR | Status: AC
  Filled 2020-06-12: qty 2

## 2020-06-12 NOTE — Progress Notes (Addendum)
Pt came in for Retacrit injection. Initial blood pressure 205/62 and after a period of 10 minutes rest retook and it was 194/58. Pt states did not take her morning blood pressure meds but had them with her. She took 50mg  hydralazine at 0930.  At 1000 BP remains elevated at 198/60. Office notified and waiting for word as to whether we should proceed with clonidine.  1045 pt's BP down to 180/68 and injection given

## 2020-06-24 DIAGNOSIS — E039 Hypothyroidism, unspecified: Secondary | ICD-10-CM | POA: Diagnosis not present

## 2020-06-24 DIAGNOSIS — I509 Heart failure, unspecified: Secondary | ICD-10-CM | POA: Diagnosis not present

## 2020-06-24 DIAGNOSIS — M1990 Unspecified osteoarthritis, unspecified site: Secondary | ICD-10-CM | POA: Diagnosis not present

## 2020-06-24 DIAGNOSIS — M810 Age-related osteoporosis without current pathological fracture: Secondary | ICD-10-CM | POA: Diagnosis not present

## 2020-06-24 DIAGNOSIS — I10 Essential (primary) hypertension: Secondary | ICD-10-CM | POA: Diagnosis not present

## 2020-06-24 DIAGNOSIS — I130 Hypertensive heart and chronic kidney disease with heart failure and stage 1 through stage 4 chronic kidney disease, or unspecified chronic kidney disease: Secondary | ICD-10-CM | POA: Diagnosis not present

## 2020-06-24 DIAGNOSIS — I129 Hypertensive chronic kidney disease with stage 1 through stage 4 chronic kidney disease, or unspecified chronic kidney disease: Secondary | ICD-10-CM | POA: Diagnosis not present

## 2020-06-24 DIAGNOSIS — E7800 Pure hypercholesterolemia, unspecified: Secondary | ICD-10-CM | POA: Diagnosis not present

## 2020-06-24 DIAGNOSIS — N184 Chronic kidney disease, stage 4 (severe): Secondary | ICD-10-CM | POA: Diagnosis not present

## 2020-06-26 ENCOUNTER — Other Ambulatory Visit: Payer: Self-pay

## 2020-06-26 ENCOUNTER — Ambulatory Visit (HOSPITAL_COMMUNITY)
Admission: RE | Admit: 2020-06-26 | Discharge: 2020-06-26 | Disposition: A | Discharge status: Home / Self Care | Payer: Medicare HMO | Source: Ambulatory Visit | Attending: Nephrology | Admitting: Nephrology

## 2020-06-26 VITALS — BP 173/53 | HR 53 | Temp 97.1°F | Resp 20

## 2020-06-26 DIAGNOSIS — D631 Anemia in chronic kidney disease: Secondary | ICD-10-CM

## 2020-06-26 DIAGNOSIS — N184 Chronic kidney disease, stage 4 (severe): Secondary | ICD-10-CM | POA: Insufficient documentation

## 2020-06-26 LAB — POCT HEMOGLOBIN-HEMACUE: Hemoglobin: 11.9 g/dL — ABNORMAL LOW (ref 12.0–15.0)

## 2020-06-26 MED ORDER — EPOETIN ALFA-EPBX 10000 UNIT/ML IJ SOLN
INTRAMUSCULAR | Status: AC
  Administered 2020-06-26: 20000 [IU] via SUBCUTANEOUS
  Filled 2020-06-26: qty 2

## 2020-06-26 MED ORDER — EPOETIN ALFA-EPBX 10000 UNIT/ML IJ SOLN: 20000.0000 [IU] | INTRAMUSCULAR | Status: DC

## 2020-07-04 DIAGNOSIS — I509 Heart failure, unspecified: Secondary | ICD-10-CM | POA: Diagnosis not present

## 2020-07-04 DIAGNOSIS — J069 Acute upper respiratory infection, unspecified: Secondary | ICD-10-CM | POA: Diagnosis not present

## 2020-07-04 DIAGNOSIS — R05 Cough: Secondary | ICD-10-CM | POA: Diagnosis not present

## 2020-07-08 DIAGNOSIS — E785 Hyperlipidemia, unspecified: Secondary | ICD-10-CM | POA: Diagnosis not present

## 2020-07-08 DIAGNOSIS — D519 Vitamin B12 deficiency anemia, unspecified: Secondary | ICD-10-CM | POA: Diagnosis not present

## 2020-07-08 DIAGNOSIS — N184 Chronic kidney disease, stage 4 (severe): Secondary | ICD-10-CM | POA: Diagnosis not present

## 2020-07-08 DIAGNOSIS — I129 Hypertensive chronic kidney disease with stage 1 through stage 4 chronic kidney disease, or unspecified chronic kidney disease: Secondary | ICD-10-CM | POA: Diagnosis not present

## 2020-07-08 DIAGNOSIS — N189 Chronic kidney disease, unspecified: Secondary | ICD-10-CM | POA: Diagnosis not present

## 2020-07-08 DIAGNOSIS — E039 Hypothyroidism, unspecified: Secondary | ICD-10-CM | POA: Diagnosis not present

## 2020-07-08 DIAGNOSIS — N2581 Secondary hyperparathyroidism of renal origin: Secondary | ICD-10-CM | POA: Diagnosis not present

## 2020-07-10 ENCOUNTER — Other Ambulatory Visit: Payer: Self-pay

## 2020-07-10 ENCOUNTER — Encounter (HOSPITAL_COMMUNITY)
Admission: RE | Admit: 2020-07-10 | Discharge: 2020-07-10 | Disposition: A | Discharge status: Home / Self Care | Payer: Medicare HMO | Source: Ambulatory Visit | Attending: Nephrology | Admitting: Nephrology

## 2020-07-10 VITALS — BP 196/55 | HR 56 | Resp 20

## 2020-07-10 DIAGNOSIS — D631 Anemia in chronic kidney disease: Secondary | ICD-10-CM | POA: Insufficient documentation

## 2020-07-10 DIAGNOSIS — N184 Chronic kidney disease, stage 4 (severe): Secondary | ICD-10-CM

## 2020-07-10 LAB — IRON AND TIBC
Iron: 101 ug/dL (ref 28–170)
Saturation Ratios: 39 % — ABNORMAL HIGH (ref 10.4–31.8)
TIBC: 260 ug/dL (ref 250–450)
UIBC: 159 ug/dL

## 2020-07-10 LAB — POCT HEMOGLOBIN-HEMACUE: Hemoglobin: 12.2 g/dL (ref 12.0–15.0)

## 2020-07-10 LAB — FERRITIN: Ferritin: 175 ng/mL (ref 11–307)

## 2020-07-10 MED ORDER — EPOETIN ALFA-EPBX 10000 UNIT/ML IJ SOLN: 20000.0000 [IU] | INTRAMUSCULAR | Status: DC

## 2020-07-11 ENCOUNTER — Ambulatory Visit
Admission: RE | Admit: 2020-07-11 | Discharge: 2020-07-11 | Disposition: A | Discharge status: Home / Self Care | Payer: Medicare HMO | Source: Ambulatory Visit | Attending: Gastroenterology | Admitting: Gastroenterology

## 2020-07-11 DIAGNOSIS — K828 Other specified diseases of gallbladder: Secondary | ICD-10-CM | POA: Diagnosis not present

## 2020-07-24 ENCOUNTER — Other Ambulatory Visit: Payer: Self-pay

## 2020-07-24 ENCOUNTER — Ambulatory Visit (HOSPITAL_COMMUNITY)
Admission: RE | Admit: 2020-07-24 | Discharge: 2020-07-24 | Disposition: A | Discharge status: Home / Self Care | Payer: Medicare HMO | Source: Ambulatory Visit | Attending: Nephrology | Admitting: Nephrology

## 2020-07-24 VITALS — BP 178/46 | HR 52 | Temp 97.2°F | Resp 20

## 2020-07-24 DIAGNOSIS — N184 Chronic kidney disease, stage 4 (severe): Secondary | ICD-10-CM | POA: Diagnosis not present

## 2020-07-24 DIAGNOSIS — D631 Anemia in chronic kidney disease: Secondary | ICD-10-CM | POA: Insufficient documentation

## 2020-07-24 LAB — POCT HEMOGLOBIN-HEMACUE: Hemoglobin: 10.7 g/dL — ABNORMAL LOW (ref 12.0–15.0)

## 2020-07-24 MED ORDER — EPOETIN ALFA-EPBX 10000 UNIT/ML IJ SOLN: 20000.0000 [IU] | INTRAMUSCULAR | Status: DC

## 2020-07-24 MED ORDER — EPOETIN ALFA-EPBX 10000 UNIT/ML IJ SOLN
INTRAMUSCULAR | Status: AC
  Administered 2020-07-24: 20000 [IU] via SUBCUTANEOUS
  Filled 2020-07-24: qty 2

## 2020-08-05 DIAGNOSIS — I2510 Atherosclerotic heart disease of native coronary artery without angina pectoris: Secondary | ICD-10-CM | POA: Diagnosis not present

## 2020-08-05 DIAGNOSIS — I10 Essential (primary) hypertension: Secondary | ICD-10-CM | POA: Diagnosis not present

## 2020-08-05 DIAGNOSIS — I5022 Chronic systolic (congestive) heart failure: Secondary | ICD-10-CM | POA: Diagnosis not present

## 2020-08-05 DIAGNOSIS — N1831 Chronic kidney disease, stage 3a: Secondary | ICD-10-CM | POA: Diagnosis not present

## 2020-08-06 DIAGNOSIS — E039 Hypothyroidism, unspecified: Secondary | ICD-10-CM | POA: Diagnosis not present

## 2020-08-06 DIAGNOSIS — M810 Age-related osteoporosis without current pathological fracture: Secondary | ICD-10-CM | POA: Diagnosis not present

## 2020-08-06 DIAGNOSIS — I509 Heart failure, unspecified: Secondary | ICD-10-CM | POA: Diagnosis not present

## 2020-08-06 DIAGNOSIS — I130 Hypertensive heart and chronic kidney disease with heart failure and stage 1 through stage 4 chronic kidney disease, or unspecified chronic kidney disease: Secondary | ICD-10-CM | POA: Diagnosis not present

## 2020-08-06 DIAGNOSIS — E7800 Pure hypercholesterolemia, unspecified: Secondary | ICD-10-CM | POA: Diagnosis not present

## 2020-08-06 DIAGNOSIS — N184 Chronic kidney disease, stage 4 (severe): Secondary | ICD-10-CM | POA: Diagnosis not present

## 2020-08-06 DIAGNOSIS — I129 Hypertensive chronic kidney disease with stage 1 through stage 4 chronic kidney disease, or unspecified chronic kidney disease: Secondary | ICD-10-CM | POA: Diagnosis not present

## 2020-08-06 DIAGNOSIS — M1990 Unspecified osteoarthritis, unspecified site: Secondary | ICD-10-CM | POA: Diagnosis not present

## 2020-08-06 DIAGNOSIS — I10 Essential (primary) hypertension: Secondary | ICD-10-CM | POA: Diagnosis not present

## 2020-08-07 ENCOUNTER — Encounter (HOSPITAL_COMMUNITY)
Admission: RE | Admit: 2020-08-07 | Discharge: 2020-08-07 | Disposition: A | Discharge status: Home / Self Care | Payer: Medicare HMO | Source: Ambulatory Visit | Attending: Nephrology | Admitting: Nephrology

## 2020-08-07 ENCOUNTER — Other Ambulatory Visit: Payer: Self-pay

## 2020-08-07 VITALS — BP 178/58 | HR 53

## 2020-08-07 DIAGNOSIS — D631 Anemia in chronic kidney disease: Secondary | ICD-10-CM | POA: Diagnosis present

## 2020-08-07 DIAGNOSIS — N184 Chronic kidney disease, stage 4 (severe): Secondary | ICD-10-CM | POA: Diagnosis not present

## 2020-08-07 LAB — FERRITIN: Ferritin: 235 ng/mL (ref 11–307)

## 2020-08-07 LAB — IRON AND TIBC
Iron: 90 ug/dL (ref 28–170)
Saturation Ratios: 34 % — ABNORMAL HIGH (ref 10.4–31.8)
TIBC: 266 ug/dL (ref 250–450)
UIBC: 176 ug/dL

## 2020-08-07 MED ORDER — EPOETIN ALFA-EPBX 10000 UNIT/ML IJ SOLN: 20000.0000 [IU] | INTRAMUSCULAR | Status: DC

## 2020-08-07 MED ORDER — EPOETIN ALFA-EPBX 10000 UNIT/ML IJ SOLN
INTRAMUSCULAR | Status: AC
  Administered 2020-08-07: 20000 [IU] via SUBCUTANEOUS
  Filled 2020-08-07: qty 2

## 2020-08-08 LAB — POCT HEMOGLOBIN-HEMACUE: Hemoglobin: 10.1 g/dL — ABNORMAL LOW (ref 12.0–15.0)

## 2020-08-21 ENCOUNTER — Other Ambulatory Visit: Payer: Self-pay

## 2020-08-21 ENCOUNTER — Encounter (HOSPITAL_COMMUNITY)
Admission: RE | Admit: 2020-08-21 | Discharge: 2020-08-21 | Disposition: A | Discharge status: Home / Self Care | Payer: Medicare HMO | Source: Ambulatory Visit | Attending: Nephrology | Admitting: Nephrology

## 2020-08-21 VITALS — BP 167/39 | HR 56 | Temp 97.6°F | Resp 20

## 2020-08-21 DIAGNOSIS — N184 Chronic kidney disease, stage 4 (severe): Secondary | ICD-10-CM | POA: Diagnosis present

## 2020-08-21 DIAGNOSIS — D631 Anemia in chronic kidney disease: Secondary | ICD-10-CM | POA: Diagnosis present

## 2020-08-21 LAB — POCT HEMOGLOBIN-HEMACUE: Hemoglobin: 10.4 g/dL — ABNORMAL LOW (ref 12.0–15.0)

## 2020-08-21 MED ORDER — EPOETIN ALFA-EPBX 10000 UNIT/ML IJ SOLN: 20000.0000 [IU] | INTRAMUSCULAR | Status: DC

## 2020-08-21 MED ORDER — EPOETIN ALFA-EPBX 10000 UNIT/ML IJ SOLN
INTRAMUSCULAR | Status: AC
  Administered 2020-08-21: 20000 [IU] via SUBCUTANEOUS
  Filled 2020-08-21: qty 2

## 2020-08-28 DIAGNOSIS — N184 Chronic kidney disease, stage 4 (severe): Secondary | ICD-10-CM | POA: Diagnosis not present

## 2020-08-28 DIAGNOSIS — M1990 Unspecified osteoarthritis, unspecified site: Secondary | ICD-10-CM | POA: Diagnosis not present

## 2020-08-28 DIAGNOSIS — I509 Heart failure, unspecified: Secondary | ICD-10-CM | POA: Diagnosis not present

## 2020-08-28 DIAGNOSIS — M810 Age-related osteoporosis without current pathological fracture: Secondary | ICD-10-CM | POA: Diagnosis not present

## 2020-08-28 DIAGNOSIS — I130 Hypertensive heart and chronic kidney disease with heart failure and stage 1 through stage 4 chronic kidney disease, or unspecified chronic kidney disease: Secondary | ICD-10-CM | POA: Diagnosis not present

## 2020-08-28 DIAGNOSIS — E039 Hypothyroidism, unspecified: Secondary | ICD-10-CM | POA: Diagnosis not present

## 2020-08-28 DIAGNOSIS — I10 Essential (primary) hypertension: Secondary | ICD-10-CM | POA: Diagnosis not present

## 2020-08-28 DIAGNOSIS — E7800 Pure hypercholesterolemia, unspecified: Secondary | ICD-10-CM | POA: Diagnosis not present

## 2020-08-28 DIAGNOSIS — I129 Hypertensive chronic kidney disease with stage 1 through stage 4 chronic kidney disease, or unspecified chronic kidney disease: Secondary | ICD-10-CM | POA: Diagnosis not present

## 2020-09-04 ENCOUNTER — Other Ambulatory Visit: Payer: Self-pay

## 2020-09-04 ENCOUNTER — Encounter (HOSPITAL_COMMUNITY)
Admission: RE | Admit: 2020-09-04 | Discharge: 2020-09-04 | Disposition: A | Discharge status: Home / Self Care | Payer: Medicare HMO | Source: Ambulatory Visit | Attending: Nephrology | Admitting: Nephrology

## 2020-09-04 VITALS — BP 158/50 | HR 58 | Resp 20

## 2020-09-04 DIAGNOSIS — I5022 Chronic systolic (congestive) heart failure: Secondary | ICD-10-CM | POA: Diagnosis not present

## 2020-09-04 DIAGNOSIS — D631 Anemia in chronic kidney disease: Secondary | ICD-10-CM

## 2020-09-04 DIAGNOSIS — I2510 Atherosclerotic heart disease of native coronary artery without angina pectoris: Secondary | ICD-10-CM | POA: Diagnosis not present

## 2020-09-04 DIAGNOSIS — N184 Chronic kidney disease, stage 4 (severe): Secondary | ICD-10-CM

## 2020-09-04 DIAGNOSIS — N1831 Chronic kidney disease, stage 3a: Secondary | ICD-10-CM | POA: Diagnosis not present

## 2020-09-04 DIAGNOSIS — I10 Essential (primary) hypertension: Secondary | ICD-10-CM | POA: Diagnosis not present

## 2020-09-04 LAB — IRON AND TIBC
Iron: 103 ug/dL (ref 28–170)
Saturation Ratios: 35 % — ABNORMAL HIGH (ref 10.4–31.8)
TIBC: 293 ug/dL (ref 250–450)
UIBC: 190 ug/dL

## 2020-09-04 LAB — POCT HEMOGLOBIN-HEMACUE: Hemoglobin: 9.8 g/dL — ABNORMAL LOW (ref 12.0–15.0)

## 2020-09-04 LAB — FERRITIN: Ferritin: 274 ng/mL (ref 11–307)

## 2020-09-04 MED ORDER — EPOETIN ALFA-EPBX 10000 UNIT/ML IJ SOLN
INTRAMUSCULAR | Status: AC
  Administered 2020-09-04: 20000 [IU] via SUBCUTANEOUS
  Filled 2020-09-04: qty 2

## 2020-09-04 MED ORDER — EPOETIN ALFA-EPBX 10000 UNIT/ML IJ SOLN: 20000.0000 [IU] | INTRAMUSCULAR | Status: DC

## 2020-09-17 ENCOUNTER — Encounter: Payer: Self-pay | Admitting: Podiatry

## 2020-09-17 ENCOUNTER — Other Ambulatory Visit: Payer: Self-pay

## 2020-09-17 ENCOUNTER — Ambulatory Visit: Payer: Medicare HMO | Admitting: Podiatry

## 2020-09-17 DIAGNOSIS — B351 Tinea unguium: Secondary | ICD-10-CM

## 2020-09-17 DIAGNOSIS — M79675 Pain in left toe(s): Secondary | ICD-10-CM | POA: Diagnosis not present

## 2020-09-17 DIAGNOSIS — M79674 Pain in right toe(s): Secondary | ICD-10-CM

## 2020-09-18 ENCOUNTER — Encounter (HOSPITAL_COMMUNITY)
Admission: RE | Admit: 2020-09-18 | Discharge: 2020-09-18 | Disposition: A | Discharge status: Home / Self Care | Payer: Medicare HMO | Source: Ambulatory Visit | Attending: Nephrology | Admitting: Nephrology

## 2020-09-18 VITALS — BP 177/49 | HR 56 | Temp 97.4°F | Resp 20

## 2020-09-18 DIAGNOSIS — D631 Anemia in chronic kidney disease: Secondary | ICD-10-CM | POA: Insufficient documentation

## 2020-09-18 DIAGNOSIS — N184 Chronic kidney disease, stage 4 (severe): Secondary | ICD-10-CM | POA: Insufficient documentation

## 2020-09-18 LAB — POCT HEMOGLOBIN-HEMACUE: Hemoglobin: 10.5 g/dL — ABNORMAL LOW (ref 12.0–15.0)

## 2020-09-18 MED ORDER — EPOETIN ALFA-EPBX 10000 UNIT/ML IJ SOLN
INTRAMUSCULAR | Status: AC
  Administered 2020-09-18: 20000 [IU] via SUBCUTANEOUS
  Filled 2020-09-18: qty 2

## 2020-09-18 MED ORDER — EPOETIN ALFA-EPBX 10000 UNIT/ML IJ SOLN: 20000.0000 [IU] | INTRAMUSCULAR | Status: DC

## 2020-09-21 ENCOUNTER — Other Ambulatory Visit: Payer: Self-pay

## 2020-09-21 ENCOUNTER — Encounter: Payer: Self-pay | Admitting: Emergency Medicine

## 2020-09-21 ENCOUNTER — Ambulatory Visit
Admission: EM | Admit: 2020-09-21 | Discharge: 2020-09-21 | Disposition: A | Discharge status: Home / Self Care | Payer: Medicare HMO | Attending: Physician Assistant | Admitting: Physician Assistant

## 2020-09-21 DIAGNOSIS — N3090 Cystitis, unspecified without hematuria: Secondary | ICD-10-CM

## 2020-09-21 LAB — POCT URINALYSIS DIP (MANUAL ENTRY)
Bilirubin, UA: NEGATIVE
Glucose, UA: NEGATIVE mg/dL
Ketones, POC UA: NEGATIVE mg/dL
Nitrite, UA: NEGATIVE
Protein Ur, POC: >=300 mg/dL — AB
Spec Grav, UA: 1.025 (ref 1.010–1.025)
Urobilinogen, UA: 0.2 E.U./dL
pH, UA: 6 (ref 5.0–8.0)

## 2020-09-21 MED ORDER — CEPHALEXIN 500 MG PO CAPS: 500.0000 mg | ORAL_CAPSULE | Freq: Two times a day (BID) | ORAL | 0 refills | Status: DC

## 2020-09-21 NOTE — ED Triage Notes (Signed)
PT reports burning and frequency (urinary). PT was treated for sepsis in June which she was told was caused by a "stomach" issue.

## 2020-09-21 NOTE — ED Provider Notes (Signed)
[name]    CSN: [id number] Arrival date & time: 09/21/20  1143      History   Chief Complaint Chief Complaint  Patient presents with  . Urinary Tract Infection    HPI Alicia Sellers is a 86 y.o. female.   86 year old female with history of CKD state IV, CHF, HTN, hypothyroidism comes in with daughter for 1 day history of dysuria, urinary frequency. Denies abdominal pain, nausea, vomiting, diarrhea. Denies fever, chills, back/flank pain. Denies vaginal problems.      Past Medical History:  Diagnosis Date  . Acute on chronic left systolic heart failure ([place]) 10/26/2019  . Anemia   . Anxiety   . Arthritis   . Chronic kidney disease   . Dyspnea   . Headache   . Hypertension   . Hypothyroidism     Patient Active Problem List   Diagnosis Date Noted  . Cholangitis 05/08/2020  . Sepsis due to Escherichia coli (E. coli) ([place]) 05/08/2020  . Transaminitis 05/08/2020  . Chronic systolic CHF (congestive heart failure) ([place]) 05/08/2020  . Sepsis ([place]) 05/08/2020  . Acute on chronic left systolic heart failure ([place]) 10/26/2019  . Otorrhea of left ear 05/23/2019  . Metabolic acidosis, normal anion gap (NAG) 12/02/2018  . Dyspnea 12/01/2018  . HTN (hypertension) 12/01/2018  . CKD (chronic kidney disease), stage IV ([place]) 12/01/2018  . Hypothyroidism 12/01/2018  . Anxiety 12/01/2018  . Anemia due to chronic kidney disease 12/01/2018  . Left chronic serous otitis media 03/31/2018  . Presbycusis of both ears 03/17/2018  . Arthritis 01/06/2012    Past Surgical History:  Procedure Laterality Date  . APPENDECTOMY    . BREAST BIOPSY Left 2015   benign  . EYE SURGERY    . TONSILLECTOMY      OB History   No obstetric history on file.      Home Medications    Prior to Admission medications   Medication Sig Start Date End Date Taking? Authorizing Provider  amLODipine (NORVASC) 5 MG tablet Take 0.5 tablets (2.5 mg total) by mouth daily. 10/30/19    [name], MD  aspirin 81 MG chewable tablet Chew 81 mg by mouth at bedtime.     [provider]  cephALEXin (KEFLEX) 500 MG capsule Take 1 capsule (500 mg total) by mouth 2 (two) times daily. 09/21/20   Tasia Catchings, Sejla Marzano V, PA-C  citalopram (CELEXA) 10 MG tablet Take 10 mg by mouth daily.    [provider]  furosemide (LASIX) 20 MG tablet Take 1 tablet (20 mg total) by mouth daily. 05/13/20   Rai, Ripudeep K, MD  gemfibrozil (LOPID) 600 MG tablet Take 600 mg by mouth 2 (two) times daily before a meal.    [provider]  hydrALAZINE (APRESOLINE) 25 MG tablet Take 50 mg by mouth in the morning and at bedtime.     [provider]  levothyroxine (SYNTHROID, LEVOTHROID) 50 MCG tablet Take 50 mcg by mouth daily before breakfast.    [provider]  LORazepam (ATIVAN) 0.5 MG tablet Take 0.25 mg by mouth at bedtime.     [provider]  metoprolol succinate (TOPROL-XL) 25 MG 24 hr tablet Take 12.5 mg by mouth every evening.     [provider]  Omega-3 Fatty Acids (FISH OIL) 1000 MG CAPS Take 1 capsule (1,000 mg total) by mouth 2 (two) times daily with a meal. 12/05/18   Gherghe, Vella Redhead, MD  ondansetron (ZOFRAN) 8 MG  tablet Take 1 tablet (8 mg total) by mouth every 8 (eight) hours as needed for nausea or vomiting. 05/11/20   Rai, Ripudeep K, MD  polyethylene glycol (MIRALAX / GLYCOLAX) 17 g packet Take 17 g by mouth daily as needed for mild constipation or moderate constipation. 05/11/20   Rai, Ripudeep K, MD  Red Yeast Rice 600 MG TABS Take 600 tablets by mouth 2 (two) times daily.    [provider]  sodium bicarbonate 650 MG tablet Take 650 mg by mouth daily.  07/19/19   [provider]  ursodiol (ACTIGALL) 300 MG capsule Take 1 capsule (300 mg total) by mouth 2 (two) times daily. 05/11/20   Rai, Vernelle Emerald, MD  vitamin E 400 UNIT capsule Take 400 Units by mouth daily.    [provider]    Family History Family History    Problem Relation Age of Onset  . Breast cancer Neg Hx     Social History Social History   Tobacco Use  . Smoking status: Never Smoker  . Smokeless tobacco: Never Used  Vaping Use  . Vaping Use: Never used  Substance Use Topics  . Alcohol use: Never  . Drug use: Never     Allergies   Latex and Penicillins   Review of Systems Review of Systems  Reason unable to perform ROS: See HPI as above.     Physical Exam Triage Vital Signs ED Triage Vitals  Enc Vitals Group     BP 09/21/20 1158 (!) 174/72     Pulse Rate 09/21/20 1158 62     Resp 09/21/20 1158 16     Temp 09/21/20 1158 98.1 F (36.7 C)     Temp Source 09/21/20 1158 Oral     SpO2 09/21/20 1158 95 %     Weight --      Height --      Head Circumference --      Peak Flow --      Pain Score 09/21/20 1156 2     Pain Loc --      Pain Edu? --      Excl. in [place]? --    No data found.  Updated Vital Signs BP (!) 174/72   Pulse 62   Temp 98.1 F (36.7 C) (Oral)   Resp 16   SpO2 95%   Physical Exam Constitutional:      General: She is not in acute distress.    Appearance: She is well-developed. She is not ill-appearing, toxic-appearing or diaphoretic.  HENT:     Head: Normocephalic and atraumatic.  Eyes:     Conjunctiva/sclera: Conjunctivae normal.     Pupils: Pupils are equal, round, and reactive to light.  Cardiovascular:     Rate and Rhythm: Normal rate and regular rhythm.  Pulmonary:     Effort: Pulmonary effort is normal. No respiratory distress.     Comments: LCTAB Abdominal:     General: Bowel sounds are normal.     Palpations: Abdomen is soft.     Tenderness: There is no abdominal tenderness. There is no right CVA tenderness, left CVA tenderness, guarding or rebound.  Musculoskeletal:     Cervical back: Normal range of motion and neck supple.  Skin:    General: Skin is warm and dry.  Neurological:     Mental Status: She is alert and oriented to person, place, and time.  Psychiatric:         Behavior: Behavior normal.  Judgment: Judgment normal.      UC Treatments / Results  Labs (all labs ordered are listed, but only abnormal results are displayed) Labs Reviewed  POCT URINALYSIS DIP (MANUAL ENTRY) - Abnormal; Notable for the following components:      Result Value   Clarity, UA cloudy (*)    Blood, UA large (*)    Protein Ur, POC >=300 (*)    Leukocytes, UA Small (1+) (*)    All other components within normal limits  URINE CULTURE    EKG   Radiology No results found.  Procedures Procedures (including critical care time)  Medications Ordered in UC Medications - No data to display  Initial Impression / Assessment and Plan / UC Course  I have reviewed the triage vital signs and the nursing notes.  Pertinent labs & imaging results that were available during my care of the patient were reviewed by me and considered in my medical decision making (see chart for details).    Dipstick with large blood, small leuks. Will treat for cystitis with keflex and send for urine culture. Per last CMP, GFR <15, dose adjusted for CKD. Push fluids. Return precautions given. Patient and daughter expresses understanding and agrees to plan.  Final Clinical Impressions(s) / UC Diagnoses   Final diagnoses:  Cystitis    ED Prescriptions    Medication Sig Dispense Auth. Provider   cephALEXin (KEFLEX) 500 MG capsule Take 1 capsule (500 mg total) by mouth 2 (two) times daily. 14 capsule Ok Edwards, PA-C     PDMP not reviewed this encounter.   Ok Edwards, PA-C 09/21/20 1231

## 2020-09-21 NOTE — Discharge Instructions (Addendum)
Your urine was positive for an urinary tract infection. Start keflex as directed. Keep hydrated, urine should be clear to pale yellow in color. Monitor for any worsening of symptoms, fever, worsening abdominal pain, nausea/vomiting, flank pain, confusion, go to the ED for further evaluation.

## 2020-09-22 NOTE — Progress Notes (Addendum)
Subjective:  Patient ID: Alicia Sellers, female    DOB: 1929/04/01,  MRN: [id number]  86 y.o. female presents with at risk foot care. Patient has h/o CKD stage 4 and painful thick toenails that are difficult to trim. Pain interferes with ambulation. Aggravating factors include wearing enclosed shoe gear. Pain is relieved with periodic professional debridement.    She has h/o permanent partial nail avulsion of the lateral border of the left hallux.  Review of Systems: Negative except as noted in the HPI.  Past Medical History:  Diagnosis Date  . Acute on chronic left systolic heart failure ([place]) 10/26/2019  . Anemia   . Anxiety   . Arthritis   . Chronic kidney disease   . Dyspnea   . Headache   . Hypertension   . Hypothyroidism    Past Surgical History:  Procedure Laterality Date  . APPENDECTOMY    . BREAST BIOPSY Left 2015   benign  . EYE SURGERY    . TONSILLECTOMY     Patient Active Problem List   Diagnosis Date Noted  . Cholangitis 05/08/2020  . Sepsis due to Escherichia coli (E. coli) ([place]) 05/08/2020  . Transaminitis 05/08/2020  . Chronic systolic CHF (congestive heart failure) ([place]) 05/08/2020  . Sepsis ([place]) 05/08/2020  . Acute on chronic left systolic heart failure ([place]) 10/26/2019  . Otorrhea of left ear 05/23/2019  . Metabolic acidosis, normal anion gap (NAG) 12/02/2018  . Dyspnea 12/01/2018  . HTN (hypertension) 12/01/2018  . CKD (chronic kidney disease), stage IV ([place]) 12/01/2018  . Hypothyroidism 12/01/2018  . Anxiety 12/01/2018  . Anemia due to chronic kidney disease 12/01/2018  . Left chronic serous otitis media 03/31/2018  . Presbycusis of both ears 03/17/2018  . Arthritis 01/06/2012    Current Outpatient Medications:  .  amLODipine (NORVASC) 5 MG tablet, Take 0.5 tablets (2.5 mg total) by mouth daily., Disp: 15 tablet, Rfl: 2 .  aspirin 81 MG chewable tablet, Chew 81 mg by mouth at bedtime. , Disp: , Rfl:  .  citalopram (CELEXA) 10 MG tablet,  Take 10 mg by mouth daily., Disp: , Rfl:  .  furosemide (LASIX) 20 MG tablet, Take 1 tablet (20 mg total) by mouth daily., Disp: , Rfl:  .  gemfibrozil (LOPID) 600 MG tablet, Take 600 mg by mouth 2 (two) times daily before a meal., Disp: , Rfl:  .  hydrALAZINE (APRESOLINE) 25 MG tablet, Take 50 mg by mouth in the morning and at bedtime. , Disp: , Rfl:  .  levothyroxine (SYNTHROID, LEVOTHROID) 50 MCG tablet, Take 50 mcg by mouth daily before breakfast., Disp: , Rfl:  .  LORazepam (ATIVAN) 0.5 MG tablet, Take 0.25 mg by mouth at bedtime. , Disp: , Rfl:  .  metoprolol succinate (TOPROL-XL) 25 MG 24 hr tablet, Take 12.5 mg by mouth every evening. , Disp: , Rfl:  .  Omega-3 Fatty Acids (FISH OIL) 1000 MG CAPS, Take 1 capsule (1,000 mg total) by mouth 2 (two) times daily with a meal., Disp: , Rfl: 0 .  ondansetron (ZOFRAN) 8 MG tablet, Take 1 tablet (8 mg total) by mouth every 8 (eight) hours as needed for nausea or vomiting., Disp: 20 tablet, Rfl: 0 .  polyethylene glycol (MIRALAX / GLYCOLAX) 17 g packet, Take 17 g by mouth daily as needed for mild constipation or moderate constipation., Disp: 30 packet, Rfl: 3 .  Red Yeast Rice 600 MG TABS, Take 600 tablets by mouth 2 (two) times daily., Disp: ,  Rfl:  .  sodium bicarbonate 650 MG tablet, Take 650 mg by mouth daily. , Disp: , Rfl:  .  ursodiol (ACTIGALL) 300 MG capsule, Take 1 capsule (300 mg total) by mouth 2 (two) times daily., Disp: 60 capsule, Rfl: 3 .  vitamin E 400 UNIT capsule, Take 400 Units by mouth daily., Disp: , Rfl:  .  cephALEXin (KEFLEX) 500 MG capsule, Take 1 capsule (500 mg total) by mouth 2 (two) times daily., Disp: 14 capsule, Rfl: 0 Allergies  Allergen Reactions  . Latex Rash  . Penicillins Rash    DID THE REACTION INVOLVE: Swelling of the face/tongue/throat, SOB, or low BP? No Sudden or severe rash/hives, skin peeling, or the inside of the mouth or nose? No Did it require medical treatment? No When did it last happen? If  all above answers are "NO", may proceed with cephalosporin use.   Social History   Occupational History  . Occupation: Retired  Tobacco Use  . Smoking status: Never Smoker  . Smokeless tobacco: Never Used  Vaping Use  . Vaping Use: Never used  Substance and Sexual Activity  . Alcohol use: Never  . Drug use: Never  . Sexual activity: Not Currently    Birth control/protection: Post-menopausal    Objective:   Constitutional Pt is a pleasant 86 y.o. Caucasian female in NAD. AAO x 3.   Vascular Capillary fill time to digits <3 seconds b/l lower extremities. Palpable pedal pulses b/l LE. Pedal hair sparse. Lower extremity skin temperature gradient within normal limits. No cyanosis or clubbing noted.  Neurologic Normal speech. Protective sensation intact 5/5 intact bilaterally with 10g monofilament b/l.  Dermatologic Pedal skin with normal turgor, texture and tone bilaterally. No open wounds bilaterally. No interdigital macerations bilaterally. Toenails 1-5 b/l elongated, discolored, dystrophic, thickened, crumbly with subungual debris and tenderness to dorsal palpation. Evidence of permanent partial nail avulsion left hallux lateral border.  Orthopedic: Normal muscle strength 5/5 to all lower extremity muscle groups bilaterally. No pain crepitus or joint limitation noted with ROM b/l.   Radiographs: None Assessment:   1. Pain due to onychomycosis of toenails of both feet    Plan:  Patient was evaluated and treated and all questions answered.  Onychomycosis with pain -Nails palliatively debridement as below. -Educated on self-care  Procedure: Nail Debridement Rationale: Pain Type of Debridement: manual, sharp debridement. Instrumentation: Nail nipper, rotary burr. Number of Nails: 10  -Examined patient. -No new findings. No new orders. -Toenails 1-5 b/l were debrided in length and girth with sterile nail nippers and dremel without iatrogenic bleeding.  -Patient to report any  pedal injuries to medical professional immediately. -Patient to continue soft, supportive shoe gear daily. -Patient/POA to call should there be question/concern in the interim.  Return in about 3 months (around 12/18/2020).  Marzetta Board, DPM

## 2020-09-23 DIAGNOSIS — R69 Illness, unspecified: Secondary | ICD-10-CM | POA: Diagnosis not present

## 2020-09-25 LAB — URINE CULTURE: Culture: >=100000 — AB

## 2020-09-27 DIAGNOSIS — I130 Hypertensive heart and chronic kidney disease with heart failure and stage 1 through stage 4 chronic kidney disease, or unspecified chronic kidney disease: Secondary | ICD-10-CM | POA: Diagnosis not present

## 2020-09-27 DIAGNOSIS — I509 Heart failure, unspecified: Secondary | ICD-10-CM | POA: Diagnosis not present

## 2020-09-27 DIAGNOSIS — M1990 Unspecified osteoarthritis, unspecified site: Secondary | ICD-10-CM | POA: Diagnosis not present

## 2020-09-27 DIAGNOSIS — E039 Hypothyroidism, unspecified: Secondary | ICD-10-CM | POA: Diagnosis not present

## 2020-09-27 DIAGNOSIS — I10 Essential (primary) hypertension: Secondary | ICD-10-CM | POA: Diagnosis not present

## 2020-09-27 DIAGNOSIS — I129 Hypertensive chronic kidney disease with stage 1 through stage 4 chronic kidney disease, or unspecified chronic kidney disease: Secondary | ICD-10-CM | POA: Diagnosis not present

## 2020-09-27 DIAGNOSIS — M810 Age-related osteoporosis without current pathological fracture: Secondary | ICD-10-CM | POA: Diagnosis not present

## 2020-09-27 DIAGNOSIS — E7800 Pure hypercholesterolemia, unspecified: Secondary | ICD-10-CM | POA: Diagnosis not present

## 2020-09-27 DIAGNOSIS — N184 Chronic kidney disease, stage 4 (severe): Secondary | ICD-10-CM | POA: Diagnosis not present

## 2020-10-02 ENCOUNTER — Other Ambulatory Visit: Payer: Self-pay

## 2020-10-02 ENCOUNTER — Encounter (HOSPITAL_COMMUNITY)
Admission: RE | Admit: 2020-10-02 | Discharge: 2020-10-02 | Disposition: A | Discharge status: Home / Self Care | Payer: Medicare HMO | Source: Ambulatory Visit | Attending: Nephrology | Admitting: Nephrology

## 2020-10-02 VITALS — BP 168/50 | HR 58 | Temp 97.6°F | Resp 14

## 2020-10-02 DIAGNOSIS — N184 Chronic kidney disease, stage 4 (severe): Secondary | ICD-10-CM | POA: Diagnosis not present

## 2020-10-02 DIAGNOSIS — D631 Anemia in chronic kidney disease: Secondary | ICD-10-CM

## 2020-10-02 LAB — IRON AND TIBC
Iron: 98 ug/dL (ref 28–170)
Saturation Ratios: 33 % — ABNORMAL HIGH (ref 10.4–31.8)
TIBC: 300 ug/dL (ref 250–450)
UIBC: 202 ug/dL

## 2020-10-02 LAB — POCT HEMOGLOBIN-HEMACUE: Hemoglobin: 10 g/dL — ABNORMAL LOW (ref 12.0–15.0)

## 2020-10-02 LAB — FERRITIN: Ferritin: 193 ng/mL (ref 11–307)

## 2020-10-02 MED ORDER — EPOETIN ALFA-EPBX 10000 UNIT/ML IJ SOLN
INTRAMUSCULAR | Status: AC
  Administered 2020-10-02: 20000 [IU] via SUBCUTANEOUS
  Filled 2020-10-02: qty 2

## 2020-10-02 MED ORDER — EPOETIN ALFA-EPBX 10000 UNIT/ML IJ SOLN: 20000.0000 [IU] | INTRAMUSCULAR | Status: DC

## 2020-10-09 DIAGNOSIS — N184 Chronic kidney disease, stage 4 (severe): Secondary | ICD-10-CM | POA: Diagnosis not present

## 2020-10-09 DIAGNOSIS — N189 Chronic kidney disease, unspecified: Secondary | ICD-10-CM | POA: Diagnosis not present

## 2020-10-09 DIAGNOSIS — I129 Hypertensive chronic kidney disease with stage 1 through stage 4 chronic kidney disease, or unspecified chronic kidney disease: Secondary | ICD-10-CM | POA: Diagnosis not present

## 2020-10-09 DIAGNOSIS — N2581 Secondary hyperparathyroidism of renal origin: Secondary | ICD-10-CM | POA: Diagnosis not present

## 2020-10-09 DIAGNOSIS — D519 Vitamin B12 deficiency anemia, unspecified: Secondary | ICD-10-CM | POA: Diagnosis not present

## 2020-10-09 DIAGNOSIS — E785 Hyperlipidemia, unspecified: Secondary | ICD-10-CM | POA: Diagnosis not present

## 2020-10-09 DIAGNOSIS — E039 Hypothyroidism, unspecified: Secondary | ICD-10-CM | POA: Diagnosis not present

## 2020-10-16 ENCOUNTER — Ambulatory Visit (HOSPITAL_COMMUNITY)
Admission: RE | Admit: 2020-10-16 | Discharge: 2020-10-16 | Disposition: A | Discharge status: Home / Self Care | Payer: Medicare HMO | Source: Ambulatory Visit | Attending: Nephrology | Admitting: Nephrology

## 2020-10-16 ENCOUNTER — Other Ambulatory Visit: Payer: Self-pay

## 2020-10-16 VITALS — BP 173/57 | HR 58 | Temp 97.3°F | Resp 20

## 2020-10-16 DIAGNOSIS — D631 Anemia in chronic kidney disease: Secondary | ICD-10-CM | POA: Diagnosis present

## 2020-10-16 DIAGNOSIS — N184 Chronic kidney disease, stage 4 (severe): Secondary | ICD-10-CM | POA: Diagnosis present

## 2020-10-16 LAB — POCT HEMOGLOBIN-HEMACUE: Hemoglobin: 11 g/dL — ABNORMAL LOW (ref 12.0–15.0)

## 2020-10-16 MED ORDER — EPOETIN ALFA-EPBX 10000 UNIT/ML IJ SOLN
20000.0000 [IU] | INTRAMUSCULAR | Status: DC
  Administered 2020-10-16: 20000 [IU] via SUBCUTANEOUS

## 2020-10-16 MED ORDER — EPOETIN ALFA-EPBX 10000 UNIT/ML IJ SOLN
INTRAMUSCULAR | Status: AC
  Filled 2020-10-16: qty 2

## 2020-10-28 DIAGNOSIS — N184 Chronic kidney disease, stage 4 (severe): Secondary | ICD-10-CM | POA: Diagnosis not present

## 2020-10-28 DIAGNOSIS — E7800 Pure hypercholesterolemia, unspecified: Secondary | ICD-10-CM | POA: Diagnosis not present

## 2020-10-28 DIAGNOSIS — R69 Illness, unspecified: Secondary | ICD-10-CM | POA: Diagnosis not present

## 2020-10-28 DIAGNOSIS — I509 Heart failure, unspecified: Secondary | ICD-10-CM | POA: Diagnosis not present

## 2020-10-28 DIAGNOSIS — I129 Hypertensive chronic kidney disease with stage 1 through stage 4 chronic kidney disease, or unspecified chronic kidney disease: Secondary | ICD-10-CM | POA: Diagnosis not present

## 2020-10-28 DIAGNOSIS — R0989 Other specified symptoms and signs involving the circulatory and respiratory systems: Secondary | ICD-10-CM | POA: Diagnosis not present

## 2020-10-28 DIAGNOSIS — D649 Anemia, unspecified: Secondary | ICD-10-CM | POA: Diagnosis not present

## 2020-10-28 DIAGNOSIS — M1990 Unspecified osteoarthritis, unspecified site: Secondary | ICD-10-CM | POA: Diagnosis not present

## 2020-10-28 DIAGNOSIS — E039 Hypothyroidism, unspecified: Secondary | ICD-10-CM | POA: Diagnosis not present

## 2020-10-28 DIAGNOSIS — G720 Drug-induced myopathy: Secondary | ICD-10-CM | POA: Diagnosis not present

## 2020-11-01 ENCOUNTER — Encounter (HOSPITAL_COMMUNITY)
Admission: RE | Admit: 2020-11-01 | Discharge: 2020-11-01 | Disposition: A | Discharge status: Home / Self Care | Payer: Medicare HMO | Source: Ambulatory Visit | Attending: Nephrology | Admitting: Nephrology

## 2020-11-01 ENCOUNTER — Other Ambulatory Visit: Payer: Self-pay

## 2020-11-01 VITALS — BP 176/53 | HR 58 | Temp 97.6°F

## 2020-11-01 DIAGNOSIS — D631 Anemia in chronic kidney disease: Secondary | ICD-10-CM | POA: Insufficient documentation

## 2020-11-01 DIAGNOSIS — N184 Chronic kidney disease, stage 4 (severe): Secondary | ICD-10-CM | POA: Diagnosis present

## 2020-11-01 LAB — POCT HEMOGLOBIN-HEMACUE: Hemoglobin: 11.7 g/dL — ABNORMAL LOW (ref 12.0–15.0)

## 2020-11-01 LAB — FERRITIN: Ferritin: 205 ng/mL (ref 11–307)

## 2020-11-01 LAB — IRON AND TIBC
Iron: 136 ug/dL (ref 28–170)
Saturation Ratios: 46 % — ABNORMAL HIGH (ref 10.4–31.8)
TIBC: 295 ug/dL (ref 250–450)
UIBC: 159 ug/dL

## 2020-11-01 MED ORDER — EPOETIN ALFA-EPBX 10000 UNIT/ML IJ SOLN
INTRAMUSCULAR | Status: AC
  Administered 2020-11-01: 20000 [IU] via SUBCUTANEOUS
  Filled 2020-11-01: qty 2

## 2020-11-01 MED ORDER — EPOETIN ALFA-EPBX 10000 UNIT/ML IJ SOLN: 20000.0000 [IU] | INTRAMUSCULAR | Status: DC

## 2020-11-04 DIAGNOSIS — I130 Hypertensive heart and chronic kidney disease with heart failure and stage 1 through stage 4 chronic kidney disease, or unspecified chronic kidney disease: Secondary | ICD-10-CM | POA: Diagnosis not present

## 2020-11-04 DIAGNOSIS — E039 Hypothyroidism, unspecified: Secondary | ICD-10-CM | POA: Diagnosis not present

## 2020-11-04 DIAGNOSIS — I509 Heart failure, unspecified: Secondary | ICD-10-CM | POA: Diagnosis not present

## 2020-11-04 DIAGNOSIS — M1990 Unspecified osteoarthritis, unspecified site: Secondary | ICD-10-CM | POA: Diagnosis not present

## 2020-11-04 DIAGNOSIS — N184 Chronic kidney disease, stage 4 (severe): Secondary | ICD-10-CM | POA: Diagnosis not present

## 2020-11-04 DIAGNOSIS — E7800 Pure hypercholesterolemia, unspecified: Secondary | ICD-10-CM | POA: Diagnosis not present

## 2020-11-04 DIAGNOSIS — I129 Hypertensive chronic kidney disease with stage 1 through stage 4 chronic kidney disease, or unspecified chronic kidney disease: Secondary | ICD-10-CM | POA: Diagnosis not present

## 2020-11-04 DIAGNOSIS — M810 Age-related osteoporosis without current pathological fracture: Secondary | ICD-10-CM | POA: Diagnosis not present

## 2020-11-04 DIAGNOSIS — I10 Essential (primary) hypertension: Secondary | ICD-10-CM | POA: Diagnosis not present

## 2020-11-15 ENCOUNTER — Other Ambulatory Visit: Payer: Self-pay

## 2020-11-15 ENCOUNTER — Encounter (HOSPITAL_COMMUNITY)
Admission: RE | Admit: 2020-11-15 | Discharge: 2020-11-15 | Disposition: A | Discharge status: Home / Self Care | Payer: Medicare HMO | Source: Ambulatory Visit | Attending: Nephrology | Admitting: Nephrology

## 2020-11-15 VITALS — BP 177/46 | HR 61 | Temp 97.3°F | Resp 20

## 2020-11-15 DIAGNOSIS — N184 Chronic kidney disease, stage 4 (severe): Secondary | ICD-10-CM

## 2020-11-15 DIAGNOSIS — D631 Anemia in chronic kidney disease: Secondary | ICD-10-CM

## 2020-11-15 LAB — POCT HEMOGLOBIN-HEMACUE: Hemoglobin: 11.7 g/dL — ABNORMAL LOW (ref 12.0–15.0)

## 2020-11-15 MED ORDER — EPOETIN ALFA-EPBX 10000 UNIT/ML IJ SOLN: 20000.0000 [IU] | INTRAMUSCULAR | Status: DC

## 2020-11-15 MED ORDER — EPOETIN ALFA-EPBX 10000 UNIT/ML IJ SOLN
INTRAMUSCULAR | Status: AC
  Administered 2020-11-15: 20000 [IU] via SUBCUTANEOUS
  Filled 2020-11-15: qty 2

## 2020-11-20 DIAGNOSIS — N184 Chronic kidney disease, stage 4 (severe): Secondary | ICD-10-CM | POA: Diagnosis not present

## 2020-11-20 DIAGNOSIS — I129 Hypertensive chronic kidney disease with stage 1 through stage 4 chronic kidney disease, or unspecified chronic kidney disease: Secondary | ICD-10-CM | POA: Diagnosis not present

## 2020-11-20 DIAGNOSIS — M1990 Unspecified osteoarthritis, unspecified site: Secondary | ICD-10-CM | POA: Diagnosis not present

## 2020-11-20 DIAGNOSIS — I130 Hypertensive heart and chronic kidney disease with heart failure and stage 1 through stage 4 chronic kidney disease, or unspecified chronic kidney disease: Secondary | ICD-10-CM | POA: Diagnosis not present

## 2020-11-20 DIAGNOSIS — M810 Age-related osteoporosis without current pathological fracture: Secondary | ICD-10-CM | POA: Diagnosis not present

## 2020-11-20 DIAGNOSIS — I509 Heart failure, unspecified: Secondary | ICD-10-CM | POA: Diagnosis not present

## 2020-11-20 DIAGNOSIS — E7800 Pure hypercholesterolemia, unspecified: Secondary | ICD-10-CM | POA: Diagnosis not present

## 2020-11-20 DIAGNOSIS — E039 Hypothyroidism, unspecified: Secondary | ICD-10-CM | POA: Diagnosis not present

## 2020-11-20 DIAGNOSIS — I10 Essential (primary) hypertension: Secondary | ICD-10-CM | POA: Diagnosis not present

## 2020-11-28 ENCOUNTER — Encounter (HOSPITAL_COMMUNITY)
Admission: RE | Admit: 2020-11-28 | Discharge: 2020-11-28 | Disposition: A | Discharge status: Home / Self Care | Payer: Medicare HMO | Source: Ambulatory Visit | Attending: Nephrology | Admitting: Nephrology

## 2020-11-28 ENCOUNTER — Other Ambulatory Visit: Payer: Self-pay

## 2020-11-28 VITALS — BP 167/52 | HR 55 | Temp 97.5°F | Resp 20

## 2020-11-28 DIAGNOSIS — N184 Chronic kidney disease, stage 4 (severe): Secondary | ICD-10-CM

## 2020-11-28 DIAGNOSIS — D631 Anemia in chronic kidney disease: Secondary | ICD-10-CM | POA: Diagnosis not present

## 2020-11-28 LAB — IRON AND TIBC
Iron: 66 ug/dL (ref 28–170)
Saturation Ratios: 23 % (ref 10.4–31.8)
TIBC: 291 ug/dL (ref 250–450)
UIBC: 225 ug/dL

## 2020-11-28 LAB — FERRITIN: Ferritin: 175 ng/mL (ref 11–307)

## 2020-11-28 LAB — POCT HEMOGLOBIN-HEMACUE: Hemoglobin: 11.5 g/dL — ABNORMAL LOW (ref 12.0–15.0)

## 2020-11-28 MED ORDER — EPOETIN ALFA-EPBX 10000 UNIT/ML IJ SOLN
20000.0000 [IU] | INTRAMUSCULAR | Status: DC
  Administered 2020-11-28: 20000 [IU] via SUBCUTANEOUS

## 2020-11-28 MED ORDER — EPOETIN ALFA-EPBX 10000 UNIT/ML IJ SOLN
INTRAMUSCULAR | Status: AC
  Filled 2020-11-28: qty 2

## 2020-11-28 MED ORDER — EPOETIN ALFA-EPBX 40000 UNIT/ML IJ SOLN
INTRAMUSCULAR | Status: AC
  Filled 2020-11-28: qty 1

## 2020-12-05 DIAGNOSIS — H9212 Otorrhea, left ear: Secondary | ICD-10-CM | POA: Diagnosis not present

## 2020-12-05 DIAGNOSIS — H9113 Presbycusis, bilateral: Secondary | ICD-10-CM | POA: Diagnosis not present

## 2020-12-10 DIAGNOSIS — E785 Hyperlipidemia, unspecified: Secondary | ICD-10-CM | POA: Diagnosis not present

## 2020-12-10 DIAGNOSIS — I272 Pulmonary hypertension, unspecified: Secondary | ICD-10-CM | POA: Diagnosis not present

## 2020-12-10 DIAGNOSIS — I739 Peripheral vascular disease, unspecified: Secondary | ICD-10-CM | POA: Diagnosis not present

## 2020-12-10 DIAGNOSIS — I2510 Atherosclerotic heart disease of native coronary artery without angina pectoris: Secondary | ICD-10-CM | POA: Diagnosis not present

## 2020-12-10 DIAGNOSIS — E261 Secondary hyperaldosteronism: Secondary | ICD-10-CM | POA: Diagnosis not present

## 2020-12-10 DIAGNOSIS — I509 Heart failure, unspecified: Secondary | ICD-10-CM | POA: Diagnosis not present

## 2020-12-10 DIAGNOSIS — R69 Illness, unspecified: Secondary | ICD-10-CM | POA: Diagnosis not present

## 2020-12-10 DIAGNOSIS — E039 Hypothyroidism, unspecified: Secondary | ICD-10-CM | POA: Diagnosis not present

## 2020-12-10 DIAGNOSIS — I130 Hypertensive heart and chronic kidney disease with heart failure and stage 1 through stage 4 chronic kidney disease, or unspecified chronic kidney disease: Secondary | ICD-10-CM | POA: Diagnosis not present

## 2020-12-11 ENCOUNTER — Encounter (HOSPITAL_COMMUNITY)
Admission: RE | Admit: 2020-12-11 | Discharge: 2020-12-11 | Disposition: A | Discharge status: Home / Self Care | Payer: Medicare HMO | Source: Ambulatory Visit | Attending: Nephrology | Admitting: Nephrology

## 2020-12-11 ENCOUNTER — Other Ambulatory Visit: Payer: Self-pay

## 2020-12-11 VITALS — BP 167/55 | HR 55 | Temp 97.5°F | Resp 20

## 2020-12-11 DIAGNOSIS — N184 Chronic kidney disease, stage 4 (severe): Secondary | ICD-10-CM | POA: Diagnosis not present

## 2020-12-11 DIAGNOSIS — D631 Anemia in chronic kidney disease: Secondary | ICD-10-CM | POA: Diagnosis not present

## 2020-12-11 LAB — POCT HEMOGLOBIN-HEMACUE: Hemoglobin: 11.7 g/dL — ABNORMAL LOW (ref 12.0–15.0)

## 2020-12-11 MED ORDER — EPOETIN ALFA-EPBX 10000 UNIT/ML IJ SOLN
INTRAMUSCULAR | Status: AC
  Filled 2020-12-11: qty 2

## 2020-12-11 MED ORDER — EPOETIN ALFA-EPBX 10000 UNIT/ML IJ SOLN: 20000.0000 [IU] | INTRAMUSCULAR | Status: DC

## 2020-12-25 ENCOUNTER — Encounter (HOSPITAL_COMMUNITY)
Admission: RE | Admit: 2020-12-25 | Discharge: 2020-12-25 | Disposition: A | Discharge status: Home / Self Care | Payer: Medicare HMO | Source: Ambulatory Visit | Attending: Nephrology | Admitting: Nephrology

## 2020-12-25 ENCOUNTER — Other Ambulatory Visit: Payer: Self-pay

## 2020-12-25 ENCOUNTER — Encounter (HOSPITAL_COMMUNITY): Payer: Medicare HMO

## 2020-12-25 VITALS — BP 169/56 | HR 58 | Temp 97.3°F | Resp 20

## 2020-12-25 DIAGNOSIS — D631 Anemia in chronic kidney disease: Secondary | ICD-10-CM

## 2020-12-25 DIAGNOSIS — N184 Chronic kidney disease, stage 4 (severe): Secondary | ICD-10-CM

## 2020-12-25 LAB — IRON AND TIBC
Iron: 97 ug/dL (ref 28–170)
Saturation Ratios: 38 % — ABNORMAL HIGH (ref 10.4–31.8)
TIBC: 252 ug/dL (ref 250–450)
UIBC: 155 ug/dL

## 2020-12-25 LAB — FERRITIN: Ferritin: 194 ng/mL (ref 11–307)

## 2020-12-25 LAB — POCT HEMOGLOBIN-HEMACUE: Hemoglobin: 10.9 g/dL — ABNORMAL LOW (ref 12.0–15.0)

## 2020-12-25 MED ORDER — EPOETIN ALFA-EPBX 10000 UNIT/ML IJ SOLN
INTRAMUSCULAR | Status: AC
  Filled 2020-12-25: qty 2

## 2020-12-25 MED ORDER — EPOETIN ALFA-EPBX 10000 UNIT/ML IJ SOLN
20000.0000 [IU] | INTRAMUSCULAR | Status: DC
  Administered 2020-12-25: 20000 [IU] via SUBCUTANEOUS

## 2020-12-27 ENCOUNTER — Ambulatory Visit: Payer: Medicare HMO | Admitting: Podiatry

## 2020-12-30 DIAGNOSIS — M810 Age-related osteoporosis without current pathological fracture: Secondary | ICD-10-CM | POA: Diagnosis not present

## 2020-12-30 DIAGNOSIS — E039 Hypothyroidism, unspecified: Secondary | ICD-10-CM | POA: Diagnosis not present

## 2020-12-30 DIAGNOSIS — I130 Hypertensive heart and chronic kidney disease with heart failure and stage 1 through stage 4 chronic kidney disease, or unspecified chronic kidney disease: Secondary | ICD-10-CM | POA: Diagnosis not present

## 2020-12-30 DIAGNOSIS — E7800 Pure hypercholesterolemia, unspecified: Secondary | ICD-10-CM | POA: Diagnosis not present

## 2020-12-30 DIAGNOSIS — I10 Essential (primary) hypertension: Secondary | ICD-10-CM | POA: Diagnosis not present

## 2020-12-30 DIAGNOSIS — I509 Heart failure, unspecified: Secondary | ICD-10-CM | POA: Diagnosis not present

## 2020-12-30 DIAGNOSIS — M1990 Unspecified osteoarthritis, unspecified site: Secondary | ICD-10-CM | POA: Diagnosis not present

## 2020-12-30 DIAGNOSIS — N184 Chronic kidney disease, stage 4 (severe): Secondary | ICD-10-CM | POA: Diagnosis not present

## 2020-12-31 DIAGNOSIS — R0602 Shortness of breath: Secondary | ICD-10-CM | POA: Diagnosis not present

## 2020-12-31 DIAGNOSIS — I5022 Chronic systolic (congestive) heart failure: Secondary | ICD-10-CM | POA: Diagnosis not present

## 2020-12-31 DIAGNOSIS — N184 Chronic kidney disease, stage 4 (severe): Secondary | ICD-10-CM | POA: Diagnosis not present

## 2020-12-31 DIAGNOSIS — E034 Atrophy of thyroid (acquired): Secondary | ICD-10-CM | POA: Diagnosis not present

## 2021-01-08 ENCOUNTER — Other Ambulatory Visit: Payer: Self-pay

## 2021-01-08 ENCOUNTER — Encounter (HOSPITAL_COMMUNITY)
Admission: RE | Admit: 2021-01-08 | Discharge: 2021-01-08 | Disposition: A | Discharge status: Home / Self Care | Payer: Medicare HMO | Source: Ambulatory Visit | Attending: Nephrology | Admitting: Nephrology

## 2021-01-08 VITALS — BP 165/46 | HR 60 | Temp 97.4°F | Resp 20

## 2021-01-08 DIAGNOSIS — N184 Chronic kidney disease, stage 4 (severe): Secondary | ICD-10-CM | POA: Insufficient documentation

## 2021-01-08 DIAGNOSIS — D631 Anemia in chronic kidney disease: Secondary | ICD-10-CM | POA: Diagnosis present

## 2021-01-08 LAB — POCT HEMOGLOBIN-HEMACUE: Hemoglobin: 11.4 g/dL — ABNORMAL LOW (ref 12.0–15.0)

## 2021-01-08 MED ORDER — EPOETIN ALFA 20000 UNIT/ML IJ SOLN
INTRAMUSCULAR | Status: AC
  Administered 2021-01-08: 20000 [IU] via SUBCUTANEOUS
  Filled 2021-01-08: qty 1

## 2021-01-08 MED ORDER — EPOETIN ALFA 20000 UNIT/ML IJ SOLN: 20000.0000 [IU] | INTRAMUSCULAR | Status: DC

## 2021-01-08 MED ORDER — EPOETIN ALFA-EPBX 10000 UNIT/ML IJ SOLN: 20000.0000 [IU] | INTRAMUSCULAR | Status: DC

## 2021-01-10 ENCOUNTER — Ambulatory Visit
Admission: EM | Admit: 2021-01-10 | Discharge: 2021-01-10 | Disposition: A | Discharge status: Home / Self Care | Payer: Medicare HMO | Attending: Emergency Medicine | Admitting: Emergency Medicine

## 2021-01-10 ENCOUNTER — Ambulatory Visit (INDEPENDENT_AMBULATORY_CARE_PROVIDER_SITE_OTHER): Payer: Medicare HMO

## 2021-01-10 ENCOUNTER — Ambulatory Visit (HOSPITAL_COMMUNITY): Admit: 2021-01-10 | Discharge status: Against Medical Advice | Payer: Medicare HMO

## 2021-01-10 ENCOUNTER — Other Ambulatory Visit: Payer: Self-pay

## 2021-01-10 DIAGNOSIS — S20211A Contusion of right front wall of thorax, initial encounter: Secondary | ICD-10-CM | POA: Diagnosis not present

## 2021-01-10 DIAGNOSIS — W19XXXA Unspecified fall, initial encounter: Secondary | ICD-10-CM

## 2021-01-10 DIAGNOSIS — M25512 Pain in left shoulder: Secondary | ICD-10-CM

## 2021-01-10 DIAGNOSIS — S40012A Contusion of left shoulder, initial encounter: Secondary | ICD-10-CM | POA: Diagnosis not present

## 2021-01-10 DIAGNOSIS — R0781 Pleurodynia: Secondary | ICD-10-CM

## 2021-01-10 MED ORDER — DICLOFENAC SODIUM 1 % EX GEL
2.0000 g | Freq: Four times a day (QID) | CUTANEOUS | 0 refills | Status: AC
Start: 2021-01-10 — End: ?

## 2021-01-10 NOTE — ED Triage Notes (Signed)
Pt presents with generalized body aches, left shoulder pain, and back pain after a fall in her bathroom on Sunday . Pt states she did not hit her head and did not have a LOC .

## 2021-01-10 NOTE — ED Provider Notes (Signed)
[name]    CSN: [id number] Arrival date & time: 01/10/21  0845      History   Chief Complaint Chief Complaint  Patient presents with  . Fall  . Generalized Body Aches  . Shoulder Pain  . Back Pain    HPI Alicia Sellers is a 87 y.o. female history of acute on chronic systolic heart failure, CKD, hypertension, presenting today for evaluation of shoulder pain and back pain after a fall.  Reports Sunday, approximately 5 days ago slipped and fell in the bathroom, hit left shoulder on toilet and left backslash ribs on tub.  Denies hitting head or loss of consciousness.  Family member with patient here today reports that she has been at her baseline since, main complaint has been pain to ribs.  Notes bruise to shoulder area.  Is on daily 81 mg aspirin, denies other blood thinners.  HPI  Past Medical History:  Diagnosis Date  . Acute on chronic left systolic heart failure ([place]) 10/26/2019  . Anemia   . Anxiety   . Arthritis   . Chronic kidney disease   . Dyspnea   . Headache   . Hypertension   . Hypothyroidism     Patient Active Problem List   Diagnosis Date Noted  . Cholangitis 05/08/2020  . Sepsis due to Escherichia coli (E. coli) ([place]) 05/08/2020  . Transaminitis 05/08/2020  . Chronic systolic CHF (congestive heart failure) ([place]) 05/08/2020  . Sepsis ([place]) 05/08/2020  . Acute on chronic left systolic heart failure ([place]) 10/26/2019  . Otorrhea of left ear 05/23/2019  . Metabolic acidosis, normal anion gap (NAG) 12/02/2018  . Dyspnea 12/01/2018  . HTN (hypertension) 12/01/2018  . CKD (chronic kidney disease), stage IV ([place]) 12/01/2018  . Hypothyroidism 12/01/2018  . Anxiety 12/01/2018  . Anemia due to chronic kidney disease 12/01/2018  . Left chronic serous otitis media 03/31/2018  . Presbycusis of both ears 03/17/2018  . Arthritis 01/06/2012    Past Surgical History:  Procedure Laterality Date  . APPENDECTOMY    . BREAST BIOPSY Left 2015    benign  . EYE SURGERY    . TONSILLECTOMY      OB History   No obstetric history on file.      Home Medications    Prior to Admission medications   Medication Sig Start Date End Date Taking? Authorizing Provider  diclofenac Sodium (VOLTAREN) 1 % GEL Apply 2 g topically 4 (four) times daily. 01/10/21  Yes Breiona Couvillon C, PA-C  amLODipine (NORVASC) 5 MG tablet Take 0.5 tablets (2.5 mg total) by mouth daily. 10/30/19   [name], MD  aspirin 81 MG chewable tablet Chew 81 mg by mouth at bedtime.     [provider]  cephALEXin (KEFLEX) 500 MG capsule Take 1 capsule (500 mg total) by mouth 2 (two) times daily. 09/21/20   Tasia Catchings, Amy V, PA-C  citalopram (CELEXA) 10 MG tablet Take 10 mg by mouth daily.    [provider]  furosemide (LASIX) 20 MG tablet Take 1 tablet (20 mg total) by mouth daily. 05/13/20   Rai, Ripudeep K, MD  gemfibrozil (LOPID) 600 MG tablet Take 600 mg by mouth 2 (two) times daily before a meal.    [provider]  hydrALAZINE (APRESOLINE) 25 MG tablet Take 50 mg by mouth in the morning and at bedtime.     [provider]  levothyroxine (SYNTHROID, LEVOTHROID) 50 MCG tablet Take 50 mcg by mouth daily before breakfast.  [provider]  LORazepam (ATIVAN) 0.5 MG tablet Take 0.25 mg by mouth at bedtime.     [provider]  metoprolol succinate (TOPROL-XL) 25 MG 24 hr tablet Take 12.5 mg by mouth every evening.     [provider]  Omega-3 Fatty Acids (FISH OIL) 1000 MG CAPS Take 1 capsule (1,000 mg total) by mouth 2 (two) times daily with a meal. 12/05/18   Gherghe, Vella Redhead, MD  ondansetron (ZOFRAN) 8 MG tablet Take 1 tablet (8 mg total) by mouth every 8 (eight) hours as needed for nausea or vomiting. 05/11/20   Rai, Ripudeep K, MD  polyethylene glycol (MIRALAX / GLYCOLAX) 17 g packet Take 17 g by mouth daily as needed for mild constipation or moderate constipation. 05/11/20   Rai, Ripudeep K, MD  Red Yeast Rice 600  MG TABS Take 600 tablets by mouth 2 (two) times daily.    [provider]  sodium bicarbonate 650 MG tablet Take 650 mg by mouth daily.  07/19/19   [provider]  ursodiol (ACTIGALL) 300 MG capsule Take 1 capsule (300 mg total) by mouth 2 (two) times daily. 05/11/20   Rai, Vernelle Emerald, MD  vitamin E 400 UNIT capsule Take 400 Units by mouth daily.    [provider]    Family History Family History  Problem Relation Age of Onset  . Breast cancer Neg Hx     Social History Social History   Tobacco Use  . Smoking status: Never Smoker  . Smokeless tobacco: Never Used  Vaping Use  . Vaping Use: Never used  Substance Use Topics  . Alcohol use: Never  . Drug use: Never     Allergies   Latex and Penicillins   Review of Systems Review of Systems  Constitutional: Negative for activity change, chills, diaphoresis and fatigue.  HENT: Negative for ear pain, tinnitus and trouble swallowing.   Eyes: Negative for photophobia and visual disturbance.  Respiratory: Negative for cough, chest tightness and shortness of breath.   Cardiovascular: Negative for chest pain and leg swelling.  Gastrointestinal: Negative for abdominal pain, blood in stool, nausea and vomiting.  Musculoskeletal: Positive for arthralgias, back pain and myalgias. Negative for gait problem, neck pain and neck stiffness.  Skin: Negative for color change and wound.  Neurological: Negative for dizziness, weakness, light-headedness, numbness and headaches.     Physical Exam Triage Vital Signs ED Triage Vitals  Enc Vitals Group     BP      Pulse      Resp      Temp      Temp src      SpO2      Weight      Height      Head Circumference      Peak Flow      Pain Score      Pain Loc      Pain Edu?      Excl. in [place]?    No data found.  Updated Vital Signs BP (S) (!) 180/96 (BP Location: Left Arm) Comment: did not take blood pressure medication & in pain  Pulse 75   Temp 98.1 F (36.7  C) (Oral)   SpO2 95%   Visual Acuity Right Eye Distance:   Left Eye Distance:   Bilateral Distance:    Right Eye Near:   Left Eye Near:    Bilateral Near:     Physical Exam Vitals and nursing note reviewed.  Constitutional:      Appearance: She is well-developed and well-nourished.     Comments: No acute distress  HENT:     Head: Normocephalic and atraumatic.     Nose: Nose normal.  Eyes:     Conjunctiva/sclera: Conjunctivae normal.  Cardiovascular:     Rate and Rhythm: Normal rate and regular rhythm.  Pulmonary:     Effort: Pulmonary effort is normal. No respiratory distress.     Comments: Breathing comfortably at rest, CTABL, no wheezing, rales or other adventitious sounds auscultated Abdominal:     General: There is no distension.  Musculoskeletal:        General: Normal range of motion.     Cervical back: Neck supple.     Comments: Bruising noted along left scapula, tender to palpation along periscapular area extending into superior trapezius, nontender along length of clavicle or AC joint, nontender proximal humerus, full active range of motion of shoulder  Left lower thoracic area with tenderness extending into flank and into the anterior lower ribs  Skin:    General: Skin is warm and dry.  Neurological:     Mental Status: She is alert and oriented to person, place, and time.  Psychiatric:        Mood and Affect: Mood and affect normal.      UC Treatments / Results  Labs (all labs ordered are listed, but only abnormal results are displayed) Labs Reviewed - No data to display  EKG   Radiology DG Ribs Unilateral W/Chest Right  Result Date: 01/10/2021 CLINICAL DATA:  Right rib pain after fall. EXAM: RIGHT RIBS AND CHEST - 3+ VIEW COMPARISON:  May 08, 2020. FINDINGS: No fracture or other bone lesions are seen involving the ribs. There is no evidence of pneumothorax or pleural effusion. Both lungs are clear. Heart size and mediastinal contours are within  normal limits. IMPRESSION: Negative. Electronically Signed   By: Marijo Conception M.D.   On: 01/10/2021 09:52   DG Shoulder Left  Result Date: 01/10/2021 CLINICAL DATA:  Left shoulder pain after fall. EXAM: LEFT SHOULDER - 2+ VIEW COMPARISON:  None. FINDINGS: There is no evidence of fracture or dislocation. There is no evidence of arthropathy or other focal bone abnormality. Soft tissues are unremarkable. IMPRESSION: Negative. Electronically Signed   By: Marijo Conception M.D.   On: 01/10/2021 09:54    Procedures Procedures (including critical care time)  Medications Ordered in UC Medications - No data to display  Initial Impression / Assessment and Plan / UC Course  I have reviewed the triage vital signs and the nursing notes.  Pertinent labs & imaging results that were available during my care of the patient were reviewed by me and considered in my medical decision making (see chart for details).     X-ray without any signs of rib fractures or abnormalities to the shoulder/scapula, likely contusion, recommending rest ice anti-inflammatories, diclofenac topically, avoiding NSAIDs due to CKD, monitor for gradual improvement.Discussed strict return precautions. Patient verbalized understanding and is agreeable with plan.  Final Clinical Impressions(s) / UC Diagnoses   Final diagnoses:  Contusion of left scapula, initial encounter  Contusion of ribs, right, initial encounter     Discharge Instructions     Xrays without fracture Lungs clear Tylenol [phone] mg every 4-6 hours Diclofenac topically to areas of pain Ice Follow up if not improving    ED Prescriptions    Medication Sig Dispense Auth. Provider   diclofenac Sodium (VOLTAREN) 1 %  GEL Apply 2 g topically 4 (four) times daily. 100 g Nancee Brownrigg, [place] C, PA-C     I have reviewed the PDMP during this encounter.   Joneen Caraway [place] C, PA-C 01/10/21 1008

## 2021-01-10 NOTE — Discharge Instructions (Addendum)
Xrays without fracture Lungs clear Tylenol [phone] mg every 4-6 hours Diclofenac topically to areas of pain Ice Follow up if not improving

## 2021-01-16 DIAGNOSIS — E039 Hypothyroidism, unspecified: Secondary | ICD-10-CM | POA: Diagnosis not present

## 2021-01-16 DIAGNOSIS — N2581 Secondary hyperparathyroidism of renal origin: Secondary | ICD-10-CM | POA: Diagnosis not present

## 2021-01-16 DIAGNOSIS — D519 Vitamin B12 deficiency anemia, unspecified: Secondary | ICD-10-CM | POA: Diagnosis not present

## 2021-01-16 DIAGNOSIS — I129 Hypertensive chronic kidney disease with stage 1 through stage 4 chronic kidney disease, or unspecified chronic kidney disease: Secondary | ICD-10-CM | POA: Diagnosis not present

## 2021-01-16 DIAGNOSIS — N184 Chronic kidney disease, stage 4 (severe): Secondary | ICD-10-CM | POA: Diagnosis not present

## 2021-01-16 DIAGNOSIS — E785 Hyperlipidemia, unspecified: Secondary | ICD-10-CM | POA: Diagnosis not present

## 2021-01-16 DIAGNOSIS — N189 Chronic kidney disease, unspecified: Secondary | ICD-10-CM | POA: Diagnosis not present

## 2021-01-22 ENCOUNTER — Other Ambulatory Visit: Payer: Self-pay

## 2021-01-22 ENCOUNTER — Encounter (HOSPITAL_COMMUNITY): Payer: Medicare HMO

## 2021-01-22 ENCOUNTER — Encounter (HOSPITAL_COMMUNITY)
Admission: RE | Admit: 2021-01-22 | Discharge: 2021-01-22 | Disposition: A | Discharge status: Home / Self Care | Payer: Medicare HMO | Source: Ambulatory Visit | Attending: Nephrology | Admitting: Nephrology

## 2021-01-22 VITALS — BP 178/49 | HR 57 | Temp 97.4°F | Resp 20

## 2021-01-22 DIAGNOSIS — N184 Chronic kidney disease, stage 4 (severe): Secondary | ICD-10-CM

## 2021-01-22 DIAGNOSIS — D631 Anemia in chronic kidney disease: Secondary | ICD-10-CM

## 2021-01-22 LAB — IRON AND TIBC
Iron: 104 ug/dL (ref 28–170)
Saturation Ratios: 38 % — ABNORMAL HIGH (ref 10.4–31.8)
TIBC: 274 ug/dL (ref 250–450)
UIBC: 170 ug/dL

## 2021-01-22 LAB — POCT HEMOGLOBIN-HEMACUE: Hemoglobin: 10.5 g/dL — ABNORMAL LOW (ref 12.0–15.0)

## 2021-01-22 LAB — FERRITIN: Ferritin: 212 ng/mL (ref 11–307)

## 2021-01-22 MED ORDER — EPOETIN ALFA 20000 UNIT/ML IJ SOLN
20000.0000 [IU] | INTRAMUSCULAR | Status: DC
  Administered 2021-01-22: 20000 [IU] via SUBCUTANEOUS

## 2021-01-22 MED ORDER — EPOETIN ALFA 20000 UNIT/ML IJ SOLN
INTRAMUSCULAR | Status: AC
  Filled 2021-01-22: qty 1

## 2021-01-23 DIAGNOSIS — H9113 Presbycusis, bilateral: Secondary | ICD-10-CM | POA: Diagnosis not present

## 2021-02-05 ENCOUNTER — Other Ambulatory Visit: Payer: Self-pay

## 2021-02-05 ENCOUNTER — Encounter (HOSPITAL_COMMUNITY)
Admission: RE | Admit: 2021-02-05 | Discharge: 2021-02-05 | Disposition: A | Discharge status: Home / Self Care | Payer: Medicare HMO | Source: Ambulatory Visit | Attending: Nephrology | Admitting: Nephrology

## 2021-02-05 DIAGNOSIS — D631 Anemia in chronic kidney disease: Secondary | ICD-10-CM | POA: Insufficient documentation

## 2021-02-05 DIAGNOSIS — N184 Chronic kidney disease, stage 4 (severe): Secondary | ICD-10-CM | POA: Diagnosis present

## 2021-02-05 LAB — POCT HEMOGLOBIN-HEMACUE: Hemoglobin: 10.8 g/dL — ABNORMAL LOW (ref 12.0–15.0)

## 2021-02-05 MED ORDER — EPOETIN ALFA 20000 UNIT/ML IJ SOLN: 20000.0000 [IU] | INTRAMUSCULAR | Status: DC

## 2021-02-05 MED ORDER — EPOETIN ALFA 20000 UNIT/ML IJ SOLN
INTRAMUSCULAR | Status: AC
  Filled 2021-02-05: qty 1

## 2021-02-05 NOTE — Progress Notes (Signed)
Patient here for Procrit injection. Blood pressure elevated 185-190s/50-60s. Pt reports taking blood pressure meds about an hour ago. Hemocue 10.8. Called and Naval architect at [employer]. Awaiting call back.

## 2021-02-05 NOTE — Progress Notes (Signed)
Amber from Dr. Jason Nest [facility]) office called back. Hold Procrit injection today; return in 1 week for recheck of blood pressure and hemoglobin. Pt instructed to keep record of home blood pressures and to again take blood pressure medications 1-2 hours prior to arrival.

## 2021-02-07 DIAGNOSIS — I509 Heart failure, unspecified: Secondary | ICD-10-CM | POA: Diagnosis not present

## 2021-02-07 DIAGNOSIS — N184 Chronic kidney disease, stage 4 (severe): Secondary | ICD-10-CM | POA: Diagnosis not present

## 2021-02-07 DIAGNOSIS — I130 Hypertensive heart and chronic kidney disease with heart failure and stage 1 through stage 4 chronic kidney disease, or unspecified chronic kidney disease: Secondary | ICD-10-CM | POA: Diagnosis not present

## 2021-02-07 DIAGNOSIS — E7800 Pure hypercholesterolemia, unspecified: Secondary | ICD-10-CM | POA: Diagnosis not present

## 2021-02-07 DIAGNOSIS — M810 Age-related osteoporosis without current pathological fracture: Secondary | ICD-10-CM | POA: Diagnosis not present

## 2021-02-07 DIAGNOSIS — M1990 Unspecified osteoarthritis, unspecified site: Secondary | ICD-10-CM | POA: Diagnosis not present

## 2021-02-07 DIAGNOSIS — E039 Hypothyroidism, unspecified: Secondary | ICD-10-CM | POA: Diagnosis not present

## 2021-02-07 DIAGNOSIS — I129 Hypertensive chronic kidney disease with stage 1 through stage 4 chronic kidney disease, or unspecified chronic kidney disease: Secondary | ICD-10-CM | POA: Diagnosis not present

## 2021-02-13 ENCOUNTER — Other Ambulatory Visit: Payer: Self-pay

## 2021-02-13 ENCOUNTER — Encounter (HOSPITAL_COMMUNITY)
Admission: RE | Admit: 2021-02-13 | Discharge: 2021-02-13 | Disposition: A | Discharge status: Home / Self Care | Payer: Medicare HMO | Source: Ambulatory Visit | Attending: Nephrology | Admitting: Nephrology

## 2021-02-13 VITALS — BP 165/57 | HR 58 | Temp 97.4°F | Resp 20

## 2021-02-13 DIAGNOSIS — N184 Chronic kidney disease, stage 4 (severe): Secondary | ICD-10-CM | POA: Diagnosis not present

## 2021-02-13 DIAGNOSIS — D631 Anemia in chronic kidney disease: Secondary | ICD-10-CM

## 2021-02-13 LAB — POCT HEMOGLOBIN-HEMACUE: Hemoglobin: 10.3 g/dL — ABNORMAL LOW (ref 12.0–15.0)

## 2021-02-13 MED ORDER — EPOETIN ALFA 20000 UNIT/ML IJ SOLN
INTRAMUSCULAR | Status: AC
  Filled 2021-02-13: qty 1

## 2021-02-13 MED ORDER — EPOETIN ALFA 20000 UNIT/ML IJ SOLN
20000.0000 [IU] | INTRAMUSCULAR | Status: DC
Start: 2021-02-13 — End: 2021-02-14
  Administered 2021-02-13: 20000 [IU] via SUBCUTANEOUS

## 2021-02-19 ENCOUNTER — Encounter (HOSPITAL_COMMUNITY): Payer: Medicare HMO

## 2021-02-27 ENCOUNTER — Encounter (HOSPITAL_COMMUNITY)
Admission: RE | Admit: 2021-02-27 | Discharge: 2021-02-27 | Disposition: A | Discharge status: Home / Self Care | Payer: Medicare HMO | Source: Ambulatory Visit | Attending: Nephrology | Admitting: Nephrology

## 2021-02-27 ENCOUNTER — Other Ambulatory Visit: Payer: Self-pay

## 2021-02-27 VITALS — BP 178/60 | HR 59 | Temp 97.5°F | Resp 20

## 2021-02-27 DIAGNOSIS — N184 Chronic kidney disease, stage 4 (severe): Secondary | ICD-10-CM | POA: Diagnosis not present

## 2021-02-27 DIAGNOSIS — D631 Anemia in chronic kidney disease: Secondary | ICD-10-CM | POA: Diagnosis not present

## 2021-02-27 LAB — IRON AND TIBC
Iron: 91 ug/dL (ref 28–170)
Saturation Ratios: 31 % (ref 10.4–31.8)
TIBC: 293 ug/dL (ref 250–450)
UIBC: 202 ug/dL

## 2021-02-27 LAB — FERRITIN: Ferritin: 211 ng/mL (ref 11–307)

## 2021-02-27 LAB — POCT HEMOGLOBIN-HEMACUE: Hemoglobin: 9.7 g/dL — ABNORMAL LOW (ref 12.0–15.0)

## 2021-02-27 MED ORDER — EPOETIN ALFA 20000 UNIT/ML IJ SOLN
INTRAMUSCULAR | Status: AC
  Administered 2021-02-27: 20000 [IU] via SUBCUTANEOUS
  Filled 2021-02-27: qty 1

## 2021-02-27 MED ORDER — EPOETIN ALFA 20000 UNIT/ML IJ SOLN: 20000.0000 [IU] | INTRAMUSCULAR | Status: DC

## 2021-03-03 ENCOUNTER — Other Ambulatory Visit: Payer: Self-pay

## 2021-03-03 ENCOUNTER — Ambulatory Visit
Admission: RE | Admit: 2021-03-03 | Discharge: 2021-03-03 | Disposition: A | Discharge status: Home / Self Care | Payer: Medicare HMO | Source: Ambulatory Visit | Attending: Emergency Medicine | Admitting: Emergency Medicine

## 2021-03-03 VITALS — BP 179/67 | HR 63 | Temp 98.0°F | Resp 20

## 2021-03-03 DIAGNOSIS — H9202 Otalgia, left ear: Secondary | ICD-10-CM | POA: Diagnosis not present

## 2021-03-03 MED ORDER — FLUTICASONE PROPIONATE 50 MCG/ACT NA SUSP: 2.0000 | Freq: Every day | NASAL | 0 refills | Status: DC

## 2021-03-03 MED ORDER — CEFDINIR 300 MG PO CAPS: 300.0000 mg | ORAL_CAPSULE | Freq: Every day | ORAL | 0 refills | Status: AC

## 2021-03-03 NOTE — Discharge Instructions (Signed)
I do recommend starting daily flonase.  5 days of oral antibiotics to see if this is helpful.  Please follow up with your ENT for further evaluation.

## 2021-03-03 NOTE — ED Triage Notes (Addendum)
This recent ear pain started on Saturday.  Denies cough, cold or runny nose.  A few months ago, had tubes removed from ears. When removed had an infection.  Pain is in left ear

## 2021-03-03 NOTE — ED Provider Notes (Signed)
[name]    CSN: [id number] Arrival date & time: 03/03/21  1346      History   Chief Complaint Chief Complaint  Patient presents with  . Otalgia  . Appointment    2:00pm    HPI Alicia Sellers is a 87 y.o. female.   Alicia Sellers presents with complaints of left ear pain which started two days ago. She has had chronic issues with this ear, had a tube in place fo r6 years which was recently removed. Decreased hearing. Sharp shooting pain to the ear. No other URI symptoms. No ear drainage. History of CKD and chf.     ROS per HPI, negative if not otherwise mentioned.      Past Medical History:  Diagnosis Date  . Acute on chronic left systolic heart failure ([place]) 10/26/2019  . Anemia   . Anxiety   . Arthritis   . Chronic kidney disease   . Dyspnea   . Headache   . Hypertension   . Hypothyroidism     Patient Active Problem List   Diagnosis Date Noted  . Cholangitis 05/08/2020  . Sepsis due to Escherichia coli (E. coli) ([place]) 05/08/2020  . Transaminitis 05/08/2020  . Chronic systolic CHF (congestive heart failure) ([place]) 05/08/2020  . Sepsis ([place]) 05/08/2020  . Acute on chronic left systolic heart failure ([place]) 10/26/2019  . Otorrhea of left ear 05/23/2019  . Metabolic acidosis, normal anion gap (NAG) 12/02/2018  . Dyspnea 12/01/2018  . HTN (hypertension) 12/01/2018  . CKD (chronic kidney disease), stage IV ([place]) 12/01/2018  . Hypothyroidism 12/01/2018  . Anxiety 12/01/2018  . Anemia due to chronic kidney disease 12/01/2018  . Left chronic serous otitis media 03/31/2018  . Presbycusis of both ears 03/17/2018  . Arthritis 01/06/2012    Past Surgical History:  Procedure Laterality Date  . APPENDECTOMY    . BREAST BIOPSY Left 2015   benign  . EYE SURGERY    . TONSILLECTOMY      OB History   No obstetric history on file.      Home Medications    Prior to Admission medications   Medication Sig Start Date End Date Taking?  Authorizing Provider  amLODipine (NORVASC) 5 MG tablet Take 0.5 tablets (2.5 mg total) by mouth daily. 10/30/19  Yes [name], MD  aspirin 81 MG chewable tablet Chew 81 mg by mouth at bedtime.    Yes [provider]  cefdinir (OMNICEF) 300 MG capsule Take 1 capsule (300 mg total) by mouth daily for 5 days. 03/03/21 03/08/21 Yes Dustin Bumbaugh, Lanelle Bal B, NP  citalopram (CELEXA) 10 MG tablet Take 10 mg by mouth daily.   Yes [provider]  fluticasone (FLONASE) 50 MCG/ACT nasal spray Place 2 sprays into both nostrils daily. 03/03/21  Yes Augusto Gamble B, NP  furosemide (LASIX) 20 MG tablet Take 1 tablet (20 mg total) by mouth daily. 05/13/20  Yes Rai, Ripudeep K, MD  gemfibrozil (LOPID) 600 MG tablet Take 600 mg by mouth 2 (two) times daily before a meal.   Yes [provider]  hydrALAZINE (APRESOLINE) 25 MG tablet Take 50 mg by mouth in the morning and at bedtime.    Yes [provider]  levothyroxine (SYNTHROID, LEVOTHROID) 50 MCG tablet Take 50 mcg by mouth daily before breakfast.   Yes [provider]  LORazepam (ATIVAN) 0.5 MG tablet Take 0.25 mg by mouth at bedtime.    Yes [provider]  metoprolol succinate (TOPROL-XL) 25  MG 24 hr tablet Take 12.5 mg by mouth every evening.    Yes [provider]  Red Yeast Rice 600 MG TABS Take 600 tablets by mouth 2 (two) times daily.   Yes [provider]  sodium bicarbonate 650 MG tablet Take 650 mg by mouth daily.  07/19/19  Yes [provider]  cephALEXin (KEFLEX) 500 MG capsule Take 1 capsule (500 mg total) by mouth 2 (two) times daily. 09/21/20   Tasia Catchings, Amy V, PA-C  diclofenac Sodium (VOLTAREN) 1 % GEL Apply 2 g topically 4 (four) times daily. 01/10/21   Wieters, Hallie C, PA-C  Omega-3 Fatty Acids (FISH OIL) 1000 MG CAPS Take 1 capsule (1,000 mg total) by mouth 2 (two) times daily with a meal. 12/05/18   Gherghe, Vella Redhead, MD  ondansetron (ZOFRAN) 8 MG tablet Take 1 tablet (8 mg  total) by mouth every 8 (eight) hours as needed for nausea or vomiting. 05/11/20   Rai, Ripudeep K, MD  polyethylene glycol (MIRALAX / GLYCOLAX) 17 g packet Take 17 g by mouth daily as needed for mild constipation or moderate constipation. 05/11/20   Rai, Vernelle Emerald, MD  ursodiol (ACTIGALL) 300 MG capsule Take 1 capsule (300 mg total) by mouth 2 (two) times daily. 05/11/20   Rai, Vernelle Emerald, MD  vitamin E 400 UNIT capsule Take 400 Units by mouth daily.    [provider]    Family History Family History  Problem Relation Age of Onset  . Breast cancer Neg Hx     Social History Social History   Tobacco Use  . Smoking status: Never Smoker  . Smokeless tobacco: Never Used  Vaping Use  . Vaping Use: Never used  Substance Use Topics  . Alcohol use: Never  . Drug use: Never     Allergies   Latex and Penicillins   Review of Systems Review of Systems   Physical Exam Triage Vital Signs ED Triage Vitals  Enc Vitals Group     BP 03/03/21 1446 (!) 179/67     Pulse Rate 03/03/21 1446 63     Resp 03/03/21 1446 20     Temp 03/03/21 1446 98 F (36.7 C)     Temp Source 03/03/21 1446 Oral     SpO2 03/03/21 1446 94 %     Weight --      Height --      Head Circumference --      Peak Flow --      Pain Score 03/03/21 1442 6     Pain Loc --      Pain Edu? --      Excl. in [place]? --    No data found.  Updated Vital Signs BP (!) 179/67 (BP Location: Left Arm)   Pulse 63   Temp 98 F (36.7 C) (Oral)   Resp 20   SpO2 94%   Visual Acuity Right Eye Distance:   Left Eye Distance:   Bilateral Distance:    Right Eye Near:   Left Eye Near:    Bilateral Near:     Physical Exam Constitutional:      General: She is not in acute distress.    Appearance: She is well-developed.  HENT:     Right Ear: Tympanic membrane and ear canal normal.     Left Ear: Tympanic membrane and ear canal normal.     Ears:     Comments: No redness bulging or effusion obvious on exam; non tender;  no canal redness or drainage Cardiovascular:     Rate and Rhythm: Normal rate.  Pulmonary:     Effort: Pulmonary effort is normal.  Skin:    General: Skin is warm and dry.  Neurological:     Mental Status: She is alert and oriented to person, place, and time.      UC Treatments / Results  Labs (all labs ordered are listed, but only abnormal results are displayed) Labs Reviewed - No data to display  EKG   Radiology No results found.  Procedures Procedures (including critical care time)  Medications Ordered in UC Medications - No data to display  Initial Impression / Assessment and Plan / UC Course  I have reviewed the triage vital signs and the nursing notes.  Pertinent labs & imaging results that were available during my care of the patient were reviewed by me and considered in my medical decision making (see chart for details).     Exam is actually quite benign here today. Acute on chronic in nature it seems. Opted to provide a short course of antibiotics, no baseline labs in chart unfortunately but due to history did provide kidney adjusted dosing. flonase also recommended. Encouraged follow up with her ENT for recheck. Patient and family verbalized understanding and agreeable to plan.   Final Clinical Impressions(s) / UC Diagnoses   Final diagnoses:  Otalgia of left ear     Discharge Instructions     I do recommend starting daily flonase.  5 days of oral antibiotics to see if this is helpful.  Please follow up with your ENT for further evaluation.     ED Prescriptions    Medication Sig Dispense Auth. Provider   fluticasone (FLONASE) 50 MCG/ACT nasal spray Place 2 sprays into both nostrils daily. 11.1 mL Augusto Gamble B, NP   cefdinir (OMNICEF) 300 MG capsule Take 1 capsule (300 mg total) by mouth daily for 5 days. 5 capsule Zigmund Gottron, NP     PDMP not reviewed this encounter.   Zigmund Gottron, NP 03/03/21 1511

## 2021-03-06 DIAGNOSIS — R519 Headache, unspecified: Secondary | ICD-10-CM | POA: Diagnosis not present

## 2021-03-07 ENCOUNTER — Other Ambulatory Visit: Payer: Self-pay

## 2021-03-07 ENCOUNTER — Ambulatory Visit: Payer: Medicare HMO | Admitting: Podiatry

## 2021-03-07 DIAGNOSIS — M79674 Pain in right toe(s): Secondary | ICD-10-CM

## 2021-03-07 DIAGNOSIS — M79675 Pain in left toe(s): Secondary | ICD-10-CM

## 2021-03-07 DIAGNOSIS — B351 Tinea unguium: Secondary | ICD-10-CM | POA: Diagnosis not present

## 2021-03-07 NOTE — Progress Notes (Signed)
  Subjective:  Patient ID: Alicia Sellers, female    DOB: December 05, 1929,  MRN: [id number]  87 y.o. female presents with at risk foot care. Patient has h/o CKD stage 4 and painful thick toenails that are difficult to trim. Pain interferes with ambulation. Aggravating factors include wearing enclosed shoe gear. Pain is relieved with periodic professional debridement.    She has h/o permanent partial nail avulsion of the lateral border of the left hallux.  PCP is Dr. Gaynelle Arabian and last visit was 03/06/2021.  Review of Systems: Negative except as noted in the HPI.   Allergies  Allergen Reactions  . Latex Rash  . Ace Inhibitors     Other reaction(s): cough  . Atorvastatin     Other reaction(s): aches  . Chlorthalidone     Other reaction(s): elevated creatanine  . Ciprofloxacin     Other reaction(s): nausea (05/2013)  . Fosamax [Alendronate]     Other reaction(s): leg pain  . Pravachol [Pravastatin]     Other reaction(s): aches  . Penicillins Rash    DID THE REACTION INVOLVE: Swelling of the face/tongue/throat, SOB, or low BP? No Sudden or severe rash/hives, skin peeling, or the inside of the mouth or nose? No Did it require medical treatment? No When did it last happen? If all above answers are "NO", may proceed with cephalosporin use.   Objective:   Constitutional Pt is a pleasant 87 y.o. Caucasian female in NAD. AAO x 3.   Vascular Capillary fill time to digits <3 seconds b/l lower extremities. Palpable pedal pulses b/l LE. Pedal hair sparse. Lower extremity skin temperature gradient within normal limits. No cyanosis or clubbing noted.  Neurologic Normal speech. Protective sensation intact 5/5 intact bilaterally with 10g monofilament b/l.  Dermatologic Pedal skin with normal turgor, texture and tone bilaterally. No open wounds bilaterally. No interdigital macerations bilaterally. Toenails 1-5 b/l elongated, discolored, dystrophic, thickened, crumbly with subungual debris  and tenderness to dorsal palpation. Evidence of permanent partial nail avulsion left hallux lateral border.  Orthopedic: Normal muscle strength 5/5 to all lower extremity muscle groups bilaterally. No pain crepitus or joint limitation noted with ROM b/l.   Radiographs: None Assessment:   1. Pain due to onychomycosis of toenails of both feet    Plan:  Patient was evaluated and treated and all questions answered.  Onychomycosis with pain -Nails palliatively debridement as below. -Educated on self-care  Procedure: Nail Debridement Rationale: Pain Type of Debridement: manual, sharp debridement. Instrumentation: Nail nipper, rotary burr. Number of Nails: 10  -Examined patient. -No new findings. No new orders. -Toenails 1-5 b/l were debrided in length and girth with sterile nail nippers and dremel without iatrogenic bleeding.  -Patient to report any pedal injuries to medical professional immediately. -Patient to continue soft, supportive shoe gear daily. -Patient/POA to call should there be question/concern in the interim.  Return in about 3 months (around 06/06/2021).  Marzetta Board, DPM

## 2021-03-13 ENCOUNTER — Inpatient Hospital Stay (HOSPITAL_COMMUNITY): Admission: RE | Admit: 2021-03-13 | Payer: Medicare HMO | Source: Ambulatory Visit

## 2021-03-15 ENCOUNTER — Encounter: Payer: Self-pay | Admitting: Podiatry

## 2021-03-20 ENCOUNTER — Encounter (HOSPITAL_COMMUNITY): Payer: Medicare HMO

## 2021-03-21 ENCOUNTER — Other Ambulatory Visit: Payer: Self-pay

## 2021-03-21 ENCOUNTER — Encounter (HOSPITAL_COMMUNITY)
Admission: RE | Admit: 2021-03-21 | Discharge: 2021-03-21 | Disposition: A | Discharge status: Home / Self Care | Payer: Medicare HMO | Source: Ambulatory Visit | Attending: Nephrology | Admitting: Nephrology

## 2021-03-21 VITALS — BP 167/63 | HR 61 | Temp 97.2°F | Resp 20

## 2021-03-21 DIAGNOSIS — I5023 Acute on chronic systolic (congestive) heart failure: Secondary | ICD-10-CM | POA: Diagnosis not present

## 2021-03-21 DIAGNOSIS — N184 Chronic kidney disease, stage 4 (severe): Secondary | ICD-10-CM

## 2021-03-21 DIAGNOSIS — D631 Anemia in chronic kidney disease: Secondary | ICD-10-CM

## 2021-03-21 DIAGNOSIS — I132 Hypertensive heart and chronic kidney disease with heart failure and with stage 5 chronic kidney disease, or end stage renal disease: Secondary | ICD-10-CM | POA: Diagnosis not present

## 2021-03-21 LAB — POCT HEMOGLOBIN-HEMACUE: Hemoglobin: 8.7 g/dL — ABNORMAL LOW (ref 12.0–15.0)

## 2021-03-21 MED ORDER — EPOETIN ALFA 20000 UNIT/ML IJ SOLN
INTRAMUSCULAR | Status: AC
  Filled 2021-03-21: qty 1

## 2021-03-21 MED ORDER — EPOETIN ALFA 20000 UNIT/ML IJ SOLN
20000.0000 [IU] | INTRAMUSCULAR | Status: DC
  Administered 2021-03-21: 20000 [IU] via SUBCUTANEOUS

## 2021-03-22 ENCOUNTER — Encounter (HOSPITAL_COMMUNITY): Payer: Self-pay

## 2021-03-22 ENCOUNTER — Inpatient Hospital Stay (HOSPITAL_COMMUNITY)
Admission: EM | Admit: 2021-03-22 | Discharge: 2021-03-24 | DRG: 291 | Disposition: A | Discharge status: Home / Self Care | Payer: Medicare HMO | Attending: Internal Medicine | Admitting: Internal Medicine

## 2021-03-22 ENCOUNTER — Other Ambulatory Visit: Payer: Self-pay

## 2021-03-22 ENCOUNTER — Emergency Department (HOSPITAL_COMMUNITY): Payer: Medicare HMO

## 2021-03-22 ENCOUNTER — Inpatient Hospital Stay (HOSPITAL_COMMUNITY): Payer: Medicare HMO

## 2021-03-22 DIAGNOSIS — Z79899 Other long term (current) drug therapy: Secondary | ICD-10-CM

## 2021-03-22 DIAGNOSIS — E039 Hypothyroidism, unspecified: Secondary | ICD-10-CM | POA: Diagnosis not present

## 2021-03-22 DIAGNOSIS — I10 Essential (primary) hypertension: Secondary | ICD-10-CM | POA: Diagnosis not present

## 2021-03-22 DIAGNOSIS — N184 Chronic kidney disease, stage 4 (severe): Secondary | ICD-10-CM | POA: Diagnosis not present

## 2021-03-22 DIAGNOSIS — I248 Other forms of acute ischemic heart disease: Secondary | ICD-10-CM | POA: Diagnosis present

## 2021-03-22 DIAGNOSIS — I509 Heart failure, unspecified: Secondary | ICD-10-CM

## 2021-03-22 DIAGNOSIS — I132 Hypertensive heart and chronic kidney disease with heart failure and with stage 5 chronic kidney disease, or end stage renal disease: Secondary | ICD-10-CM | POA: Diagnosis not present

## 2021-03-22 DIAGNOSIS — I340 Nonrheumatic mitral (valve) insufficiency: Secondary | ICD-10-CM | POA: Diagnosis not present

## 2021-03-22 DIAGNOSIS — Z881 Allergy status to other antibiotic agents status: Secondary | ICD-10-CM

## 2021-03-22 DIAGNOSIS — Z7982 Long term (current) use of aspirin: Secondary | ICD-10-CM

## 2021-03-22 DIAGNOSIS — F419 Anxiety disorder, unspecified: Secondary | ICD-10-CM | POA: Diagnosis present

## 2021-03-22 DIAGNOSIS — R0902 Hypoxemia: Secondary | ICD-10-CM | POA: Diagnosis not present

## 2021-03-22 DIAGNOSIS — Z743 Need for continuous supervision: Secondary | ICD-10-CM | POA: Diagnosis not present

## 2021-03-22 DIAGNOSIS — Z79890 Hormone replacement therapy (postmenopausal): Secondary | ICD-10-CM

## 2021-03-22 DIAGNOSIS — I452 Bifascicular block: Secondary | ICD-10-CM | POA: Diagnosis present

## 2021-03-22 DIAGNOSIS — Z888 Allergy status to other drugs, medicaments and biological substances status: Secondary | ICD-10-CM | POA: Diagnosis not present

## 2021-03-22 DIAGNOSIS — Z20822 Contact with and (suspected) exposure to covid-19: Secondary | ICD-10-CM | POA: Diagnosis not present

## 2021-03-22 DIAGNOSIS — R69 Illness, unspecified: Secondary | ICD-10-CM | POA: Diagnosis not present

## 2021-03-22 DIAGNOSIS — I517 Cardiomegaly: Secondary | ICD-10-CM | POA: Diagnosis not present

## 2021-03-22 DIAGNOSIS — Z79891 Long term (current) use of opiate analgesic: Secondary | ICD-10-CM

## 2021-03-22 DIAGNOSIS — I5023 Acute on chronic systolic (congestive) heart failure: Secondary | ICD-10-CM | POA: Diagnosis not present

## 2021-03-22 DIAGNOSIS — Z880 Allergy status to penicillin: Secondary | ICD-10-CM | POA: Diagnosis not present

## 2021-03-22 DIAGNOSIS — D631 Anemia in chronic kidney disease: Secondary | ICD-10-CM | POA: Diagnosis present

## 2021-03-22 DIAGNOSIS — I5043 Acute on chronic combined systolic (congestive) and diastolic (congestive) heart failure: Secondary | ICD-10-CM | POA: Diagnosis not present

## 2021-03-22 DIAGNOSIS — J9601 Acute respiratory failure with hypoxia: Secondary | ICD-10-CM | POA: Diagnosis present

## 2021-03-22 DIAGNOSIS — Z6822 Body mass index (BMI) 22.0-22.9, adult: Secondary | ICD-10-CM

## 2021-03-22 DIAGNOSIS — E785 Hyperlipidemia, unspecified: Secondary | ICD-10-CM | POA: Diagnosis not present

## 2021-03-22 DIAGNOSIS — R0602 Shortness of breath: Secondary | ICD-10-CM | POA: Diagnosis not present

## 2021-03-22 DIAGNOSIS — Z91040 Latex allergy status: Secondary | ICD-10-CM | POA: Diagnosis not present

## 2021-03-22 DIAGNOSIS — M419 Scoliosis, unspecified: Secondary | ICD-10-CM | POA: Diagnosis not present

## 2021-03-22 DIAGNOSIS — E034 Atrophy of thyroid (acquired): Secondary | ICD-10-CM | POA: Diagnosis not present

## 2021-03-22 DIAGNOSIS — E440 Moderate protein-calorie malnutrition: Secondary | ICD-10-CM | POA: Diagnosis present

## 2021-03-22 DIAGNOSIS — N185 Chronic kidney disease, stage 5: Secondary | ICD-10-CM | POA: Diagnosis not present

## 2021-03-22 DIAGNOSIS — I110 Hypertensive heart disease with heart failure: Secondary | ICD-10-CM | POA: Diagnosis not present

## 2021-03-22 DIAGNOSIS — M1990 Unspecified osteoarthritis, unspecified site: Secondary | ICD-10-CM | POA: Diagnosis present

## 2021-03-22 DIAGNOSIS — R069 Unspecified abnormalities of breathing: Secondary | ICD-10-CM | POA: Diagnosis not present

## 2021-03-22 DIAGNOSIS — I4510 Unspecified right bundle-branch block: Secondary | ICD-10-CM | POA: Diagnosis not present

## 2021-03-22 DIAGNOSIS — I361 Nonrheumatic tricuspid (valve) insufficiency: Secondary | ICD-10-CM | POA: Diagnosis not present

## 2021-03-22 LAB — TROPONIN I (HIGH SENSITIVITY)
Troponin I (High Sensitivity): 29 ng/L — ABNORMAL HIGH (ref <18)
Troponin I (High Sensitivity): 25 ng/L — ABNORMAL HIGH (ref <18)

## 2021-03-22 LAB — ECHOCARDIOGRAM COMPLETE
Area-P 1/2: 3.85 cm2
P 1/2 time: 439 msec
S' Lateral: 3.4 cm

## 2021-03-22 LAB — CBC
HCT: 28.8 % — ABNORMAL LOW (ref 36.0–46.0)
Hemoglobin: 9.2 g/dL — ABNORMAL LOW (ref 12.0–15.0)
MCH: 31.4 pg (ref 26.0–34.0)
MCHC: 31.9 g/dL (ref 30.0–36.0)
MCV: 98.3 fL (ref 80.0–100.0)
Platelets: 235 10*3/uL (ref 150–400)
RBC: 2.93 MIL/uL — ABNORMAL LOW (ref 3.87–5.11)
RDW: 15.5 % (ref 11.5–15.5)
WBC: 5.6 10*3/uL (ref 4.0–10.5)
nRBC: 0 % (ref 0.0–0.2)

## 2021-03-22 LAB — SARS CORONAVIRUS 2 (TAT 6-24 HRS): SARS Coronavirus 2: NEGATIVE

## 2021-03-22 LAB — BASIC METABOLIC PANEL
Anion gap: 12 (ref 5–15)
BUN: 77 mg/dL — ABNORMAL HIGH (ref 8–23)
CO2: 21 mmol/L — ABNORMAL LOW (ref 22–32)
Calcium: 9.3 mg/dL (ref 8.9–10.3)
Chloride: 107 mmol/L (ref 98–111)
Creatinine, Ser: 4.18 mg/dL — ABNORMAL HIGH (ref 0.44–1.00)
GFR, Estimated: 10 mL/min — ABNORMAL LOW (ref >60)
Glucose, Bld: 101 mg/dL — ABNORMAL HIGH (ref 70–99)
Potassium: 4.8 mmol/L (ref 3.5–5.1)
Sodium: 140 mmol/L (ref 135–145)

## 2021-03-22 LAB — D-DIMER, QUANTITATIVE: D-Dimer, Quant: 2.05 ug/mL-FEU — ABNORMAL HIGH (ref 0.00–0.50)

## 2021-03-22 LAB — BRAIN NATRIURETIC PEPTIDE: B Natriuretic Peptide: >4500 pg/mL — ABNORMAL HIGH (ref 0.0–100.0)

## 2021-03-22 MED ORDER — NITROGLYCERIN 0.4 MG SL SUBL
0.4000 mg | SUBLINGUAL_TABLET | SUBLINGUAL | Status: DC | PRN
Start: 2021-03-22 — End: 2021-03-24

## 2021-03-22 MED ORDER — LORAZEPAM 0.5 MG PO TABS
0.2500 mg | ORAL_TABLET | Freq: Every day | ORAL | Status: DC
  Administered 2021-03-22 – 2021-03-23 (×2): 0.25 mg via ORAL
  Filled 2021-03-22 (×2): qty 1

## 2021-03-22 MED ORDER — MECLIZINE HCL 25 MG PO TABS: 25.0000 mg | ORAL_TABLET | Freq: Three times a day (TID) | ORAL | Status: DC | PRN

## 2021-03-22 MED ORDER — FUROSEMIDE 10 MG/ML IJ SOLN
40.0000 mg | Freq: Every day | INTRAMUSCULAR | Status: DC
  Administered 2021-03-23 – 2021-03-24 (×2): 40 mg via INTRAVENOUS
  Filled 2021-03-22 (×2): qty 4

## 2021-03-22 MED ORDER — FUROSEMIDE 10 MG/ML IJ SOLN
40.0000 mg | Freq: Once | INTRAMUSCULAR | Status: AC
  Administered 2021-03-22: 40 mg via INTRAVENOUS
  Filled 2021-03-22: qty 4

## 2021-03-22 MED ORDER — ASPIRIN EC 81 MG PO TBEC
81.0000 mg | DELAYED_RELEASE_TABLET | Freq: Every day | ORAL | Status: DC
  Administered 2021-03-22 – 2021-03-24 (×3): 81 mg via ORAL
  Filled 2021-03-22 (×4): qty 1

## 2021-03-22 MED ORDER — HEPARIN SODIUM (PORCINE) 5000 UNIT/ML IJ SOLN
5000.0000 [IU] | Freq: Three times a day (TID) | INTRAMUSCULAR | Status: DC
  Administered 2021-03-22 – 2021-03-24 (×6): 5000 [IU] via SUBCUTANEOUS
  Filled 2021-03-22 (×6): qty 1

## 2021-03-22 MED ORDER — CITALOPRAM HYDROBROMIDE 20 MG PO TABS
10.0000 mg | ORAL_TABLET | Freq: Every day | ORAL | Status: DC
  Administered 2021-03-22 – 2021-03-24 (×3): 10 mg via ORAL
  Filled 2021-03-22 (×4): qty 1

## 2021-03-22 MED ORDER — VITAMIN E 45 MG (100 UNIT) PO CAPS
400.0000 [IU] | ORAL_CAPSULE | Freq: Every day | ORAL | Status: DC
  Administered 2021-03-22 – 2021-03-24 (×3): 400 [IU] via ORAL
  Filled 2021-03-22 (×4): qty 4

## 2021-03-22 MED ORDER — HYDRALAZINE HCL 50 MG PO TABS
50.0000 mg | ORAL_TABLET | Freq: Three times a day (TID) | ORAL | Status: DC
  Administered 2021-03-22 – 2021-03-24 (×6): 50 mg via ORAL
  Filled 2021-03-22 (×4): qty 1
  Filled 2021-03-22: qty 2
  Filled 2021-03-22 (×2): qty 1

## 2021-03-22 MED ORDER — LEVOTHYROXINE SODIUM 50 MCG PO TABS
50.0000 ug | ORAL_TABLET | Freq: Every day | ORAL | Status: DC
  Administered 2021-03-23 – 2021-03-24 (×2): 50 ug via ORAL
  Filled 2021-03-22 (×2): qty 1

## 2021-03-22 MED ORDER — LORATADINE 10 MG PO TABS
10.0000 mg | ORAL_TABLET | Freq: Every day | ORAL | Status: DC
  Administered 2021-03-23 – 2021-03-24 (×2): 10 mg via ORAL
  Filled 2021-03-22 (×3): qty 1

## 2021-03-22 MED ORDER — ONDANSETRON HCL 4 MG/2ML IJ SOLN: 4.0000 mg | Freq: Four times a day (QID) | INTRAMUSCULAR | Status: DC | PRN

## 2021-03-22 MED ORDER — GEMFIBROZIL 600 MG PO TABS
600.0000 mg | ORAL_TABLET | Freq: Two times a day (BID) | ORAL | Status: DC
  Administered 2021-03-22 – 2021-03-24 (×4): 600 mg via ORAL
  Filled 2021-03-22 (×6): qty 1

## 2021-03-22 MED ORDER — TRAMADOL HCL 50 MG PO TABS
50.0000 mg | ORAL_TABLET | Freq: Two times a day (BID) | ORAL | Status: DC | PRN
  Filled 2021-03-22: qty 1

## 2021-03-22 MED ORDER — METOPROLOL SUCCINATE ER 25 MG PO TB24
12.5000 mg | ORAL_TABLET | Freq: Every evening | ORAL | Status: DC
  Administered 2021-03-22 – 2021-03-23 (×2): 12.5 mg via ORAL
  Filled 2021-03-22 (×2): qty 1

## 2021-03-22 MED ORDER — SODIUM BICARBONATE 650 MG PO TABS
650.0000 mg | ORAL_TABLET | Freq: Three times a day (TID) | ORAL | Status: DC
  Administered 2021-03-22 – 2021-03-24 (×6): 650 mg via ORAL
  Filled 2021-03-22 (×7): qty 1

## 2021-03-22 MED ORDER — FLUTICASONE PROPIONATE 50 MCG/ACT NA SUSP: 2.0000 | Freq: Every day | NASAL | Status: DC | PRN

## 2021-03-22 MED ORDER — AMLODIPINE BESYLATE 5 MG PO TABS
5.0000 mg | ORAL_TABLET | Freq: Two times a day (BID) | ORAL | Status: DC
  Administered 2021-03-22 – 2021-03-24 (×4): 5 mg via ORAL
  Filled 2021-03-22 (×5): qty 1

## 2021-03-22 MED ORDER — URSODIOL 300 MG PO CAPS
300.0000 mg | ORAL_CAPSULE | Freq: Two times a day (BID) | ORAL | Status: DC
  Administered 2021-03-22 – 2021-03-24 (×4): 300 mg via ORAL
  Filled 2021-03-22 (×5): qty 1

## 2021-03-22 MED ORDER — ONDANSETRON HCL 4 MG PO TABS: 4.0000 mg | ORAL_TABLET | Freq: Four times a day (QID) | ORAL | Status: DC | PRN

## 2021-03-22 NOTE — Progress Notes (Signed)
  Echocardiogram 2D Echocardiogram has been performed.  Alicia Sellers 03/22/2021, 3:58 PM

## 2021-03-22 NOTE — ED Notes (Signed)
Pt 02 on ra was dipping into high 80s, put pt on 2L [place], pt O2 now at 97% or greater

## 2021-03-22 NOTE — H&P (Signed)
History and Physical    Alicia Sellers CVE:[id number] DOB: 04/24/1929 DOA: 03/22/2021  PCP: Gaynelle Arabian, MD  Patient coming from: Home  Chief Complaint: dyspnea  HPI: Alicia Sellers is a 87 y.o. female with medical history significant of anxiety, CKD5, HTN, hypothyroidism, HLD. Presenting with dyspnea. She has had dyspnea for the last week. It's worse with exertion. She has no chest pain. She has a non-productive cough with deep inspiration. She denies fevers and sick contacts. She reports being compliant on her lasix daily. She reports normal urination. She denies any weight gain or increase edema. She has tried rest to make things better, but this does not provide complete relieve. She notes that activity or laying flat makes things worse. She reports some nausea and general malaise. Her symptoms seemed significantly worse last night. So, she decided to come to the ED today. She denies any other aggravating or alleviating factors.     ED Course: CXR was negative for pulmonary disease. She was found to have a BNP of >45k. Her trp was minimally elevated. She was given lasix 40mg  IV x 1 and placed on O2. TRH was called for admission.    Review of Systems:  She denies CP, palpitations, lightheadedness, dizziness, N/V/D, fever. Review of systems is otherwise negative for all not mentioned in HPI.   PMHx Past Medical History:  Diagnosis Date  . Acute on chronic left systolic heart failure ([place]) 10/26/2019  . Anemia   . Anxiety   . Arthritis   . Chronic kidney disease   . Dyspnea   . Headache   . Hypertension   . Hypothyroidism     PSHx Past Surgical History:  Procedure Laterality Date  . APPENDECTOMY    . BREAST BIOPSY Left 2015   benign  . EYE SURGERY    . TONSILLECTOMY      SocHx  reports that she has never smoked. She has never used smokeless tobacco. She reports that she does not drink alcohol and does not use drugs.  Allergies  Allergen Reactions  . Latex Rash   . Ace Inhibitors     Other reaction(s): cough  . Atorvastatin     Other reaction(s): aches  . Chlorthalidone     Other reaction(s): elevated creatanine  . Ciprofloxacin     Other reaction(s): nausea (05/2013)  . Fosamax [Alendronate]     Other reaction(s): leg pain  . Pravachol [Pravastatin]     Other reaction(s): aches  . Penicillins Rash    DID THE REACTION INVOLVE: Swelling of the face/tongue/throat, SOB, or low BP? No Sudden or severe rash/hives, skin peeling, or the inside of the mouth or nose? No Did it require medical treatment? No When did it last happen? If all above answers are "NO", may proceed with cephalosporin use.    FamHx Family History  Problem Relation Age of Onset  . Breast cancer Neg Hx     Prior to Admission medications   Medication Sig Start Date End Date Taking? Authorizing Provider  acetaminophen (TYLENOL) 500 MG tablet Take 1,000 mg by mouth every 6 (six) hours as needed for mild pain.   Yes [provider]  amLODipine (NORVASC) 10 MG tablet Take 5 mg by mouth 2 (two) times daily. 10/30/19  Yes [provider]  aspirin 81 MG EC tablet Take 81 mg by mouth daily.   Yes [provider]  citalopram (CELEXA) 10 MG tablet Take 10 mg by mouth daily.   Yes [provider]  diclofenac Sodium (VOLTAREN) 1 % GEL Apply 2 g topically 4 (four) times daily. Patient taking differently: Apply 2 g topically 2 (two) times daily as needed (pain). 01/10/21  Yes Wieters, Hallie C, PA-C  fexofenadine (ALLEGRA) 180 MG tablet Take 180 mg by mouth daily as needed for allergies.   Yes [provider]  fluticasone (FLONASE) 50 MCG/ACT nasal spray Place 2 sprays into both nostrils daily as needed for allergies.   Yes [provider]  furosemide (LASIX) 20 MG tablet Take 20 mg by mouth daily. 05/11/20  Yes [provider]  gemfibrozil (LOPID) 600 MG tablet Take 600 mg by mouth 2 (two) times daily before a meal.   Yes  [provider]  hydrALAZINE (APRESOLINE) 25 MG tablet Take 50 mg by mouth in the morning and at bedtime.    Yes [provider]  levothyroxine (SYNTHROID) 50 MCG tablet Take 50 mcg by mouth daily before breakfast.   Yes [provider]  LORazepam (ATIVAN) 0.5 MG tablet Take 0.25 mg by mouth at bedtime.    Yes [provider]  meclizine (ANTIVERT) 25 MG tablet Take 25 mg by mouth 3 (three) times daily as needed for dizziness (vertigo). 02/09/11  Yes [provider]  metoprolol succinate (TOPROL-XL) 25 MG 24 hr tablet Take 12.5 mg by mouth every evening.    Yes [provider]  Omega-3 Fatty Acids (FISH OIL) 1200 MG CAPS Take 1,200 mg by mouth in the morning and at bedtime.   Yes [provider]  ondansetron (ZOFRAN) 8 MG tablet Take 1 tablet (8 mg total) by mouth every 8 (eight) hours as needed for nausea or vomiting. 05/11/20  Yes Rai, Ripudeep K, MD  ondansetron (ZOFRAN-ODT) 4 MG disintegrating tablet Take 4 mg by mouth every 8 (eight) hours as needed for nausea.   Yes [provider]  Red Yeast Rice Extract 600 MG CAPS Take 600 mg by mouth 2 (two) times daily.   Yes [provider]  sodium bicarbonate 650 MG tablet Take 650 mg by mouth in the morning, at noon, and at bedtime.   Yes [provider]  traMADol (ULTRAM) 50 MG tablet Take 50 mg by mouth every 12 (twelve) hours as needed for moderate pain. 10/18/13  Yes [provider]  ursodiol (ACTIGALL) 300 MG capsule Take 300 mg by mouth 2 (two) times daily. 03/07/21  Yes [provider]  vitamin E 180 MG (400 UNITS) capsule Take 400 Units by mouth daily.   Yes [provider]  Wheat Dextrin (BENEFIBER DRINK MIX PO) Take 1 Scoop by mouth daily as needed (constipation).   Yes [provider]  cephALEXin (KEFLEX) 500 MG capsule Take 1 capsule (500 mg total) by mouth 2 (two) times daily. Patient not taking: No sig reported 09/21/20    Ok Edwards, PA-C  epoetin alfa (PROCRIT) 4000 UNIT/ML injection     [provider]  polyethylene glycol (MIRALAX / GLYCOLAX) 17 g packet Take 17 g by mouth daily as needed for mild constipation or moderate constipation. Patient not taking: Reported on 03/22/2021 05/11/20   Mendel Corning, MD    Physical Exam: Vitals:   03/22/21 1115 03/22/21 1130 03/22/21 1213 03/22/21 1230  BP:  (!) 195/104 (!) 180/86 (!) 165/93  Pulse: 75 77 91 75  Resp: (!) 25 (!) 28 19 (!) 26  Temp:      TempSrc:      SpO2: 94% 93% 90% (!) 87%  General: 87 y.o. female resting in bed in NAD Eyes: PERRL, normal sclera ENMT: Nares patent w/o discharge, orophaynx clear, dentition normal, ears w/o discharge/lesions/ulcers Neck: Supple, trachea midline Cardiovascular: RRR, +S1, S2, no m/g/r, equal pulses throughout Respiratory: soft crackles at bases, otherwise good air movement, no w/r, normal WOB on 2L [place]; feels better after lasix GI: BS+, NDNT, no masses noted, no organomegaly noted MSK: No e/c/c Skin: No rashes, bruises, ulcerations noted Neuro: A&O x 3, no focal deficits Psyc: Appropriate interaction and affect, calm/cooperative  Labs on Admission: I have personally reviewed following labs and imaging studies  CBC: Recent Labs  Lab 03/21/21 1122 03/22/21 1123  WBC  --  5.6  HGB 8.7* 9.2*  HCT  --  28.8*  MCV  --  98.3  PLT  --  834   Basic Metabolic Panel: Recent Labs  Lab 03/22/21 0957  NA 140  K 4.8  CL 107  CO2 21*  GLUCOSE 101*  BUN 77*  CREATININE 4.18*  CALCIUM 9.3   GFR: CrCl cannot be calculated (Unknown ideal weight.). Liver Function Tests: No results for input(s): AST, ALT, ALKPHOS, BILITOT, PROT, ALBUMIN in the last 168 hours. No results for input(s): LIPASE, AMYLASE in the last 168 hours. No results for input(s): AMMONIA in the last 168 hours. Coagulation Profile: No results for input(s): INR, PROTIME in the last 168 hours. Cardiac Enzymes: No results for  input(s): CKTOTAL, CKMB, CKMBINDEX, TROPONINI in the last 168 hours. BNP (last 3 results) No results for input(s): PROBNP in the last 8760 hours. HbA1C: No results for input(s): HGBA1C in the last 72 hours. CBG: No results for input(s): GLUCAP in the last 168 hours. Lipid Profile: No results for input(s): CHOL, HDL, LDLCALC, TRIG, CHOLHDL, LDLDIRECT in the last 72 hours. Thyroid Function Tests: No results for input(s): TSH, T4TOTAL, FREET4, T3FREE, THYROIDAB in the last 72 hours. Anemia Panel: No results for input(s): VITAMINB12, FOLATE, FERRITIN, TIBC, IRON, RETICCTPCT in the last 72 hours. Urine analysis:    Component Value Date/Time   COLORURINE YELLOW 05/07/2020 2138   APPEARANCEUR CLEAR 05/07/2020 2138   LABSPEC 1.012 05/07/2020 2138   PHURINE 5.0 05/07/2020 2138   GLUCOSEU NEGATIVE 05/07/2020 2138   HGBUR NEGATIVE 05/07/2020 2138   BILIRUBINUR negative 09/21/2020 1201   KETONESUR negative 09/21/2020 1201   KETONESUR NEGATIVE 05/07/2020 2138   PROTEINUR >=300 (A) 09/21/2020 1201   PROTEINUR 100 (A) 05/07/2020 2138   UROBILINOGEN 0.2 09/21/2020 1201   NITRITE Negative 09/21/2020 1201   NITRITE NEGATIVE 05/07/2020 2138   LEUKOCYTESUR Small (1+) (A) 09/21/2020 1201   LEUKOCYTESUR NEGATIVE 05/07/2020 2138    Radiological Exams on Admission: DG Chest 2 View  Result Date: 03/22/2021 CLINICAL DATA:  Shortness of breath. EXAM: CHEST - 2 VIEW COMPARISON:  Chest x-rays dated 01/10/2021 and 05/08/2020 FINDINGS: Borderline cardiomegaly, stable. Chronic bronchitic changes centrally. No confluent opacity to suggest a developing pneumonia. No pleural effusion or pneumothorax is seen. Compression deformity of a vertebral body in the mid/lower thoracic spine, of uncertain age but not convincingly seen on previous study suggesting that it is new. IMPRESSION: 1. No active cardiopulmonary disease. No evidence of pneumonia or pulmonary edema. 2. Chronic bronchitic changes. 3. Questionable new  compression deformity of a vertebral body in the mid/lower thoracic spine, of uncertain age and possibly artifactual due to patient's pronounced scoliosis at the thoracolumbar junction. If any recent trauma or midline back pain, consider dedicated plain film of the thoracic spine for further characterization. Electronically Signed  By: Franki Cabot M.D.   On: 03/22/2021 10:55   DG Thoracic Spine 2 View  Result Date: 03/22/2021 CLINICAL DATA:  Shortness of breath on exertion. Assess for possible compression deformity of vertebral body in the midthoracic spine. EXAM: THORACIC SPINE 2 VIEWS COMPARISON:  Chest x-ray March 22, 2021, chest x-ray May 08, 2020 FINDINGS: Probable chronic compression deformities of at least 3 mid to lower thoracic vertebral bodies are identified without cortical displacement. These findings are minimally worsened compared to prior chest x-ray of May 08, 2020. There is no dislocation. Scoliosis of the spine is identified. IMPRESSION: Probable chronic compression deformities of at least 3 mid to lower thoracic vertebral bodies are identified without cortical displacement. These findings are minimally worsened compared to prior chest x-ray of May 08, 2020. Electronically Signed   By: Abelardo Diesel M.D.   On: 03/22/2021 12:43    EKG: Independently reviewed. Sinus, RBBB noted  Assessment/Plan Acute on chronic systolic HF Dyspnea     - admit to inpt, tele     - continue lasix 40mg  IV     - daily wt, I&O     - echo ordered     - check d-dimer, trend trp     - cardiology consulted     - needs better BP control  CKD5     - SCr, BUN and GFR @ baseline     - still making urine; acid-base/'lytes are ok     - follows w/ Dr. Justin Mend     - at this point, careful monitoring for renal function; currently necessary to increase lasix; low threshold to get nephro involved      - continue HCO3-  HTN     - needs better BP control right now     - at presentation SBPs 165 - 202; sure that  is causing some strain on the heart     - continue home regimen; increase hydralazine and metoprolol as tolerated  Hypothyroid     - continue home synthroid  HLD     - continue home lopid  Anxiety     - continue home regimen  DVT prophylaxis: heparin  Code Status: FULL  Family Communication: w/ dtr at bedside  Consults called: Cardiology (Dr. Doylene Canard)  Status is: Inpatient  Remains inpatient appropriate because:Inpatient level of care appropriate due to severity of illness   Dispo: The patient is from: Home              Anticipated d/c is to: Home              Patient currently is not medically stable to d/c.   Difficult to place patient No  Time spent coordinating admission: 75 minutes  [facility]  If 7PM-7AM, please contact night-coverage [email or web address]  03/22/2021, 12:59 PM

## 2021-03-22 NOTE — ED Notes (Signed)
Brought pt snack

## 2021-03-22 NOTE — Plan of Care (Signed)
  Problem: Education: Goal: Ability to demonstrate management of disease process will improve Outcome: Progressing Goal: Ability to verbalize understanding of medication therapies will improve Outcome: Progressing Goal: Individualized Educational Video(s) Outcome: Progressing   Problem: Activity: Goal: Capacity to carry out activities will improve Outcome: Progressing   

## 2021-03-22 NOTE — ED Notes (Signed)
Pt currently denies chest pain, states she just feels short of breath

## 2021-03-22 NOTE — ED Notes (Signed)
Pulse ox while ambulating remained at 94-96%

## 2021-03-22 NOTE — Consult Note (Signed)
Referring Physician: Sylvan Cheese, DO  Alicia Sellers is an 87 y.o. female.                       Chief Complaint: Shortness of breath  HPI: 87 years old white female with PMH of Chronic systolic left heart failure, Anemia, Anxiety, Arthritis, CKD V, Hypertension and hypothyroidism has progressive shortness of breath with activity and lying down. Symptoms are worsening over 1-2 weeks.  Chest x-ray is negative for pulmonary edema or pneumonia. Troponin I is minimally elevated but BNP is markedly and chronically elevated over 45K. She is feeling better post IV lasix use.  Past Medical History:  Diagnosis Date  . Acute on chronic left systolic heart failure ([place]) 10/26/2019  . Anemia   . Anxiety   . Arthritis   . Chronic kidney disease   . Dyspnea   . Headache   . Hypertension   . Hypothyroidism       Past Surgical History:  Procedure Laterality Date  . APPENDECTOMY    . BREAST BIOPSY Left 2015   benign  . EYE SURGERY    . TONSILLECTOMY      Family History  Problem Relation Age of Onset  . Breast cancer Neg Hx    Social History:  reports that she has never smoked. She has never used smokeless tobacco. She reports that she does not drink alcohol and does not use drugs.  Allergies:  Allergies  Allergen Reactions  . Latex Rash  . Ace Inhibitors     Other reaction(s): cough  . Atorvastatin     Other reaction(s): aches  . Chlorthalidone     Other reaction(s): elevated creatanine  . Ciprofloxacin     Other reaction(s): nausea (05/2013)  . Fosamax [Alendronate]     Other reaction(s): leg pain  . Pravachol [Pravastatin]     Other reaction(s): aches  . Penicillins Rash    DID THE REACTION INVOLVE: Swelling of the face/tongue/throat, SOB, or low BP? No Sudden or severe rash/hives, skin peeling, or the inside of the mouth or nose? No Did it require medical treatment? No When did it last happen? If all above answers are "NO", may proceed with cephalosporin use.     Medications Prior to Admission  Medication Sig Dispense Refill  . acetaminophen (TYLENOL) 500 MG tablet Take 1,000 mg by mouth every 6 (six) hours as needed for mild pain.    Marland Kitchen amLODipine (NORVASC) 10 MG tablet Take 5 mg by mouth 2 (two) times daily.    Marland Kitchen aspirin 81 MG EC tablet Take 81 mg by mouth daily.    . citalopram (CELEXA) 10 MG tablet Take 10 mg by mouth daily.    . diclofenac Sodium (VOLTAREN) 1 % GEL Apply 2 g topically 4 (four) times daily. (Patient taking differently: Apply 2 g topically 2 (two) times daily as needed (pain).) 100 g 0  . fexofenadine (ALLEGRA) 180 MG tablet Take 180 mg by mouth daily as needed for allergies.    . fluticasone (FLONASE) 50 MCG/ACT nasal spray Place 2 sprays into both nostrils daily as needed for allergies.    . furosemide (LASIX) 20 MG tablet Take 20 mg by mouth daily.    Marland Kitchen gemfibrozil (LOPID) 600 MG tablet Take 600 mg by mouth 2 (two) times daily before a meal.    . hydrALAZINE (APRESOLINE) 25 MG tablet Take 50 mg by mouth in the morning and at bedtime.     Marland Kitchen levothyroxine (  SYNTHROID) 50 MCG tablet Take 50 mcg by mouth daily before breakfast.    . LORazepam (ATIVAN) 0.5 MG tablet Take 0.25 mg by mouth at bedtime.     . meclizine (ANTIVERT) 25 MG tablet Take 25 mg by mouth 3 (three) times daily as needed for dizziness (vertigo).    . metoprolol succinate (TOPROL-XL) 25 MG 24 hr tablet Take 12.5 mg by mouth every evening.     . Omega-3 Fatty Acids (FISH OIL) 1200 MG CAPS Take 1,200 mg by mouth in the morning and at bedtime.    . ondansetron (ZOFRAN) 8 MG tablet Take 1 tablet (8 mg total) by mouth every 8 (eight) hours as needed for nausea or vomiting. 20 tablet 0  . ondansetron (ZOFRAN-ODT) 4 MG disintegrating tablet Take 4 mg by mouth every 8 (eight) hours as needed for nausea.    . Red Yeast Rice Extract 600 MG CAPS Take 600 mg by mouth 2 (two) times daily.    . sodium bicarbonate 650 MG tablet Take 650 mg by mouth in the morning, at noon, and at  bedtime.    . traMADol (ULTRAM) 50 MG tablet Take 50 mg by mouth every 12 (twelve) hours as needed for moderate pain.    . ursodiol (ACTIGALL) 300 MG capsule Take 300 mg by mouth 2 (two) times daily.    . vitamin E 180 MG (400 UNITS) capsule Take 400 Units by mouth daily.    . Wheat Dextrin (BENEFIBER DRINK MIX PO) Take 1 Scoop by mouth daily as needed (constipation).    . cephALEXin (KEFLEX) 500 MG capsule Take 1 capsule (500 mg total) by mouth 2 (two) times daily. (Patient not taking: No sig reported) 14 capsule 0  . epoetin alfa (PROCRIT) 4000 UNIT/ML injection     . polyethylene glycol (MIRALAX / GLYCOLAX) 17 g packet Take 17 g by mouth daily as needed for mild constipation or moderate constipation. (Patient not taking: Reported on 03/22/2021) 30 packet 3    Results for orders placed or performed during the hospital encounter of 03/22/21 (from the past 48 hour(s))  Basic metabolic panel     Status: Abnormal   Collection Time: 03/22/21  9:57 AM  Result Value Ref Range   Sodium 140 135 - 145 mmol/L   Potassium 4.8 3.5 - 5.1 mmol/L   Chloride 107 98 - 111 mmol/L   CO2 21 (L) 22 - 32 mmol/L   Glucose, Bld 101 (H) 70 - 99 mg/dL    Comment: Glucose reference range applies only to samples taken after fasting for at least 8 hours.   BUN 77 (H) 8 - 23 mg/dL   Creatinine, Ser 4.18 (H) 0.44 - 1.00 mg/dL   Calcium 9.3 8.9 - 10.3 mg/dL   GFR, Estimated 10 (L) >60 mL/min    Comment: (NOTE) Calculated using the CKD-EPI Creatinine Equation (2021)    Anion gap 12 5 - 15    Comment: Performed at [facility], [place] [address] [place],  [address]  Brain natriuretic peptide     Status: Abnormal   Collection Time: 03/22/21  9:57 AM  Result Value Ref Range   B Natriuretic Peptide >4,500.0 (H) 0.0 - 100.0 pg/mL    Comment: Performed at [facility], [place] [address] [place], [place] [address]  Troponin I (High Sensitivity)     Status: Abnormal   Collection  Time: 03/22/21  9:57 AM  Result Value Ref Range   Troponin I (High Sensitivity)  29 (H) <18 ng/L    Comment: (NOTE) Elevated high sensitivity troponin I (hsTnI) values and significant  changes across serial measurements may suggest ACS but many other  chronic and acute conditions are known to elevate hsTnI results.  Refer to the "Links" section for chest pain algorithms and additional  guidance. Performed at [facility], [place] [address], [place], [place] [address]   CBC     Status: Abnormal   Collection Time: 03/22/21 11:23 AM  Result Value Ref Range   WBC 5.6 4.0 - 10.5 K/uL   RBC 2.93 (L) 3.87 - 5.11 MIL/uL   Hemoglobin 9.2 (L) 12.0 - 15.0 g/dL   HCT 28.8 (L) 36.0 - 46.0 %   MCV 98.3 80.0 - 100.0 fL   MCH 31.4 26.0 - 34.0 pg   MCHC 31.9 30.0 - 36.0 g/dL   RDW 15.5 11.5 - 15.5 %   Platelets 235 150 - 400 K/uL   nRBC 0.0 0.0 - 0.2 %    Comment: Performed at [facility], [place] [address], [place], [place] [address]  Troponin I (High Sensitivity)     Status: Abnormal   Collection Time: 03/22/21 12:53 PM  Result Value Ref Range   Troponin I (High Sensitivity) 25 (H) <18 ng/L    Comment: (NOTE) Elevated high sensitivity troponin I (hsTnI) values and significant  changes across serial measurements may suggest ACS but many other  chronic and acute conditions are known to elevate hsTnI results.  Refer to the "Links" section for chest pain algorithms and additional  guidance. Performed at [facility], [place] [address], [place], [place] [address]   D-dimer, quantitative     Status: Abnormal   Collection Time: 03/22/21  1:04 PM  Result Value Ref Range   D-Dimer, Quant 2.05 (H) 0.00 - 0.50 ug/mL-FEU    Comment: (NOTE) At the manufacturer cut-off value of 0.5 g/mL FEU, this assay has a negative predictive value of 95-100%.This assay is intended for use in conjunction with a clinical pretest probability (PTP) assessment model to  exclude pulmonary embolism (PE) and deep venous thrombosis (DVT) in outpatients suspected of PE or DVT. Results should be correlated with clinical presentation. Performed at [facility], [place] [address], [place], [place] [address]    DG Chest 2 View  Result Date: 03/22/2021 CLINICAL DATA:  Shortness of breath. EXAM: CHEST - 2 VIEW COMPARISON:  Chest x-rays dated 01/10/2021 and 05/08/2020 FINDINGS: Borderline cardiomegaly, stable. Chronic bronchitic changes centrally. No confluent opacity to suggest a developing pneumonia. No pleural effusion or pneumothorax is seen. Compression deformity of a vertebral body in the mid/lower thoracic spine, of uncertain age but not convincingly seen on previous study suggesting that it is new. IMPRESSION: 1. No active cardiopulmonary disease. No evidence of pneumonia or pulmonary edema. 2. Chronic bronchitic changes. 3. Questionable new compression deformity of a vertebral body in the mid/lower thoracic spine, of uncertain age and possibly artifactual due to patient's pronounced scoliosis at the thoracolumbar junction. If any recent trauma or midline back pain, consider dedicated plain film of the thoracic spine for further characterization. Electronically Signed   By: Franki Cabot M.D.   On: 03/22/2021 10:55   DG Thoracic Spine 2 View  Result Date: 03/22/2021 CLINICAL DATA:  Shortness of breath on exertion. Assess for possible compression deformity of vertebral body in the midthoracic spine. EXAM: THORACIC SPINE 2 VIEWS COMPARISON:  Chest x-ray March 22, 2021, chest x-ray May 08, 2020 FINDINGS:  Probable chronic compression deformities of at least 3 mid to lower thoracic vertebral bodies are identified without cortical displacement. These findings are minimally worsened compared to prior chest x-ray of May 08, 2020. There is no dislocation. Scoliosis of the spine is identified. IMPRESSION: Probable chronic compression deformities of at least 3 mid to  lower thoracic vertebral bodies are identified without cortical displacement. These findings are minimally worsened compared to prior chest x-ray of May 08, 2020. Electronically Signed   By: Abelardo Diesel M.D.   On: 03/22/2021 12:43    Review Of Systems Constitutional: No fever, chills, chronic weight loss. Eyes: No vision change, wears glasses. No discharge or pain. Ears: No hearing loss, No tinnitus. Respiratory: No asthma, COPD, pneumonias. Positive shortness of breath. No hemoptysis. Cardiovascular: No chest pain, palpitation, leg edema. Gastrointestinal: Positive nausea, vomiting, no diarrhea, constipation. No GI bleed. No hepatitis. Genitourinary: No dysuria, hematuria, kidney stone. No incontinance. Positive CKD. Neurological: No headache, stroke, seizures.  Psychiatry: No psych facility admission for anxiety, depression, suicide. No detox. Skin: No rash. Musculoskeletal: Positive joint pain, no fibromyalgia. No neck pain, back pain. Lymphadenopathy: No lymphadenopathy. Hematology: Positive anemia or easy bruising.   Blood pressure (!) 181/89, pulse 89, temperature 98 F (36.7 C), temperature source Oral, resp. rate (!) 24, SpO2 94 %. There is no height or weight on file to calculate BMI. General appearance: alert, cooperative, appears stated age and no distress Head: Normocephalic, atraumatic. Eyes: Blue eyes, pink conjunctiva, corneas clear. PERRL, EOM's intact. Neck: No adenopathy, no carotid bruit, + JVD, supple, symmetrical, trachea midline and thyroid not enlarged. Resp: Clearing to auscultation bilaterally. Cardio: Regular rate and rhythm, S1, S2 normal, II/VI systolic murmur, no click, rub or gallop GI: Soft, non-tender; bowel sounds normal; no organomegaly. Extremities: No edema, cyanosis or clubbing. Skin: Warm and dry.  Neurologic: Alert and oriented X 3, normal strength.  Assessment/Plan Acute on chronic systolic left heart failure, HFrEF HTN Hypothyroidism CKD,  V Anemia of chronic disease Protein calorie malnutrition, moderate  Offered cardiac interventions but patient and family decline. Continue medical treatment. 2D echocardiogram for LV function.  Time spent: Review of old records, Lab, x-rays, EKG, other cardiac tests, examination, discussion with patient/doctor and family over 70 minutes.  Birdie Riddle, MD  03/22/2021, 3:06 PM

## 2021-03-22 NOTE — ED Triage Notes (Signed)
Patient presented to ed with c/o shortness of breath for past two days with exertion. Patient report that her BP is been elevated high than normal.

## 2021-03-22 NOTE — ED Notes (Signed)
Family at bedside. 

## 2021-03-22 NOTE — ED Provider Notes (Signed)
[facility] DEPT Provider Note   CSN: [id number] Arrival date & time: 03/22/21  2440     History No chief complaint on file.   Alicia Sellers is a 87 y.o. female.  The history is provided by the patient and medical records. No language interpreter was used.     87 year old female with hx of CHF, HTN, anemia, CKD, who presents for evaluation of shortness of breath.  Patient report for the past 2 to 3 days she has been feeling more fatigued than usual, endorsed increased shortness of breath with exertion and with walking is as well as when she lays flat in bed.  She also noticed that ankle may have been swelling a bit more than usual.  She does not complain of any fever chills no productive cough no pleuritic chest pain no nausea vomiting diarrhea palpitation no recent sick contact.  She does weigh herself regularly and have not noticed any significant fluid retention.  No prior history of PE or DVT.  No cold symptoms.  Does have history of hypertension and is compliant with her medication and states her blood pressure normally runs high.      Past Medical History:  Diagnosis Date  . Acute on chronic left systolic heart failure ([place]) 10/26/2019  . Anemia   . Anxiety   . Arthritis   . Chronic kidney disease   . Dyspnea   . Headache   . Hypertension   . Hypothyroidism     Patient Active Problem List   Diagnosis Date Noted  . Cholangitis 05/08/2020  . Sepsis due to Escherichia coli (E. coli) ([place]) 05/08/2020  . Transaminitis 05/08/2020  . Chronic systolic CHF (congestive heart failure) ([place]) 05/08/2020  . Sepsis ([place]) 05/08/2020  . Acute on chronic left systolic heart failure ([place]) 10/26/2019  . Otorrhea of left ear 05/23/2019  . Metabolic acidosis, normal anion gap (NAG) 12/02/2018  . Dyspnea 12/01/2018  . HTN (hypertension) 12/01/2018  . CKD (chronic kidney disease), stage IV ([place]) 12/01/2018  . Hypothyroidism 12/01/2018  . Anxiety  12/01/2018  . Anemia due to chronic kidney disease 12/01/2018  . Left chronic serous otitis media 03/31/2018  . Presbycusis of both ears 03/17/2018  . Arthritis 01/06/2012    Past Surgical History:  Procedure Laterality Date  . APPENDECTOMY    . BREAST BIOPSY Left 2015   benign  . EYE SURGERY    . TONSILLECTOMY       OB History   No obstetric history on file.     Family History  Problem Relation Age of Onset  . Breast cancer Neg Hx     Social History   Tobacco Use  . Smoking status: Never Smoker  . Smokeless tobacco: Never Used  Vaping Use  . Vaping Use: Never used  Substance Use Topics  . Alcohol use: Never  . Drug use: Never    Home Medications Prior to Admission medications   Medication Sig Start Date End Date Taking? Authorizing Provider  acetaminophen (ACETAMINOPHEN EXTRA STRENGTH) 500 MG tablet     [provider]  amLODipine (NORVASC) 10 MG tablet  10/30/19   [provider]  amLODipine (NORVASC) 5 MG tablet Take 0.5 tablets (2.5 mg total) by mouth daily. 10/30/19   [name], MD  aspirin 81 MG EC tablet     [provider]  cephALEXin (KEFLEX) 250 MG capsule Take 250 mg by mouth 2 (two) times daily. 01/08/21   [provider]  cephALEXin (KEFLEX) 500 MG capsule Take 1 capsule (500 mg total) by mouth 2 (two) times daily. 09/21/20   Tasia Catchings, Amy V, PA-C  citalopram (CELEXA) 10 MG tablet Take 10 mg by mouth daily.    [provider]  diclofenac Sodium (VOLTAREN) 1 % GEL Apply 2 g topically 4 (four) times daily. 01/10/21   Wieters, Hallie C, PA-C  epoetin alfa (PROCRIT) 4000 UNIT/ML injection     [provider]  fexofenadine (ALLEGRA ALLERGY) 180 MG tablet     [provider]  fluticasone (FLONASE) 50 MCG/ACT nasal spray Place into the nose.    [provider]  furosemide (LASIX) 20 MG tablet  05/11/20   [provider]  gemfibrozil (LOPID) 600 MG tablet     [provider]   hydrALAZINE (APRESOLINE) 25 MG tablet Take 50 mg by mouth in the morning and at bedtime.     [provider]  levothyroxine (SYNTHROID) 50 MCG tablet     [provider]  LORazepam (ATIVAN) 0.5 MG tablet Take 0.25 mg by mouth at bedtime.     [provider]  meclizine (ANTIVERT) 25 MG tablet  02/09/11   [provider]  metaxalone (SKELAXIN) 800 MG tablet     [provider]  metoprolol succinate (TOPROL-XL) 25 MG 24 hr tablet Take 12.5 mg by mouth every evening.     [provider]  ofloxacin (FLOXIN) 0.3 % OTIC solution SMARTSIG:In Ear(s) 12/05/20   [provider]  Omega-3 Fatty Acids (FISH OIL) 1200 MG CAPS     [provider]  ondansetron (ZOFRAN) 8 MG tablet Take 1 tablet (8 mg total) by mouth every 8 (eight) hours as needed for nausea or vomiting. 05/11/20   Rai, Ripudeep K, MD  ondansetron (ZOFRAN-ODT) 4 MG disintegrating tablet     [provider]  polyethylene glycol (MIRALAX / GLYCOLAX) 17 g packet Take 17 g by mouth daily as needed for mild constipation or moderate constipation. 05/11/20   Mendel Corning, MD  traMADol Veatrice Bourbon) 50 MG tablet  10/18/13   [provider]  vitamin E 180 MG (400 UNITS) capsule     [provider]  vitamin E 400 UNIT capsule Take 400 Units by mouth daily.    [provider]    Allergies    Latex, Ace inhibitors, Atorvastatin, Chlorthalidone, Ciprofloxacin, Fosamax [alendronate], Pravachol [pravastatin], and Penicillins  Review of Systems   Review of Systems  All other systems reviewed and are negative.   Physical Exam Updated Vital Signs BP (!) 180/86   Pulse 91   Temp 98 F (36.7 C) (Oral)   Resp 19   SpO2 90%   Physical Exam Vitals and nursing note reviewed.  Constitutional:      General: She is not in acute distress.    Appearance: She is well-developed.     Comments: Elderly female laying in bed appears to be in no acute discomfort.   HENT:     Head: Atraumatic.  Eyes:     Conjunctiva/sclera: Conjunctivae normal.  Neck:     Comments: No obvious JVD Cardiovascular:     Rate and Rhythm: Normal rate and regular rhythm.     Heart sounds: Murmur heard.    Pulmonary:     Effort: Pulmonary effort is normal.     Breath sounds: Normal breath sounds. No wheezing, rhonchi or rales.  Abdominal:     Palpations: Abdomen is soft.     Tenderness: There is  no abdominal tenderness.  Musculoskeletal:     Cervical back: Neck supple.     Right lower leg: No edema.     Left lower leg: No edema.  Skin:    Findings: No rash.  Neurological:     Mental Status: She is alert. Mental status is at baseline.  Psychiatric:        Mood and Affect: Mood normal.     ED Results / Procedures / Treatments   Labs (all labs ordered are listed, but only abnormal results are displayed) Labs Reviewed  BASIC METABOLIC PANEL - Abnormal; Notable for the following components:      Result Value   CO2 21 (*)    Glucose, Bld 101 (*)    BUN 77 (*)    Creatinine, Ser 4.18 (*)    GFR, Estimated 10 (*)    All other components within normal limits  BRAIN NATRIURETIC PEPTIDE - Abnormal; Notable for the following components:   B Natriuretic Peptide >4,500.0 (*)    All other components within normal limits  CBC - Abnormal; Notable for the following components:   RBC 2.93 (*)    Hemoglobin 9.2 (*)    HCT 28.8 (*)    All other components within normal limits  D-DIMER, QUANTITATIVE - Abnormal; Notable for the following components:   D-Dimer, Quant 2.05 (*)    All other components within normal limits  COMPREHENSIVE METABOLIC PANEL - Abnormal; Notable for the following components:   CO2 21 (*)    BUN 71 (*)    Creatinine, Ser 3.86 (*)    Total Protein 6.3 (*)    Albumin 3.2 (*)    GFR, Estimated 11 (*)    All other components within normal limits  CBC WITH DIFFERENTIAL/PLATELET - Abnormal; Notable for the following components:   RBC 2.83 (*)     Hemoglobin 8.7 (*)    HCT 27.4 (*)    All other components within normal limits  TROPONIN I (HIGH SENSITIVITY) - Abnormal; Notable for the following components:   Troponin I (High Sensitivity) 29 (*)    All other components within normal limits  TROPONIN I (HIGH SENSITIVITY) - Abnormal; Notable for the following components:   Troponin I (High Sensitivity) 25 (*)    All other components within normal limits  SARS CORONAVIRUS 2 (TAT 6-24 HRS)    EKG EKG Interpretation  Date/Time:  Saturday March 22 2021 09:48:13 EDT Ventricular Rate:  70 PR Interval:  29 QRS Duration: 145 QT Interval:  455 QTC Calculation: 491 R Axis:   -84 Text Interpretation: Sinus rhythm Short PR interval LAE, consider biatrial enlargement RBBB and LAFB no acute ischemia Confirmed by Lorre Munroe ([id number]) on 03/22/2021 11:15:27 AM   Radiology DG Chest 2 View  Result Date: 03/22/2021 CLINICAL DATA:  Shortness of breath. EXAM: CHEST - 2 VIEW COMPARISON:  Chest x-rays dated 01/10/2021 and 05/08/2020 FINDINGS: Borderline cardiomegaly, stable. Chronic bronchitic changes centrally. No confluent opacity to suggest a developing pneumonia. No pleural effusion or pneumothorax is seen. Compression deformity of a vertebral body in the mid/lower thoracic spine, of uncertain age but not convincingly seen on previous study suggesting that it is new. IMPRESSION: 1. No active cardiopulmonary disease. No evidence of pneumonia or pulmonary edema. 2. Chronic bronchitic changes. 3. Questionable new compression deformity of a vertebral body in the mid/lower thoracic spine, of uncertain age and possibly artifactual due to patient's pronounced scoliosis at the thoracolumbar junction. If any recent trauma or midline back pain, consider  dedicated plain film of the thoracic spine for further characterization. Electronically Signed   By: Franki Cabot M.D.   On: 03/22/2021 10:55    Procedures .Critical Care Performed by: Domenic Moras,  PA-C Authorized by: Domenic Moras, PA-C   Critical care provider statement:    Critical care time (minutes):  40   Critical care was time spent personally by me on the following activities:  Discussions with consultants, evaluation of patient's response to treatment, examination of patient, ordering and performing treatments and interventions, ordering and review of laboratory studies, ordering and review of radiographic studies, pulse oximetry, re-evaluation of patient's condition, obtaining history from patient or surrogate and review of old charts     Medications Ordered in ED Medications  nitroGLYCERIN (NITROSTAT) SL tablet 0.4 mg (has no administration in time range)  aspirin EC tablet 81 mg (81 mg Oral Given 03/23/21 0856)  traMADol (ULTRAM) tablet 50 mg (has no administration in time range)  amLODipine (NORVASC) tablet 5 mg (5 mg Oral Given 03/23/21 0855)  gemfibrozil (LOPID) tablet 600 mg (600 mg Oral Given 03/23/21 0855)  hydrALAZINE (APRESOLINE) tablet 50 mg (50 mg Oral Given 03/23/21 1240)  metoprolol succinate (TOPROL-XL) 24 hr tablet 12.5 mg (12.5 mg Oral Given 03/22/21 1626)  citalopram (CELEXA) tablet 10 mg (10 mg Oral Given 03/23/21 0855)  LORazepam (ATIVAN) tablet 0.25 mg (0.25 mg Oral Given 03/22/21 2202)  levothyroxine (SYNTHROID) tablet 50 mcg (50 mcg Oral Given 03/23/21 0601)  meclizine (ANTIVERT) tablet 25 mg (has no administration in time range)  sodium bicarbonate tablet 650 mg (650 mg Oral Given 03/23/21 0855)  ursodiol (ACTIGALL) capsule 300 mg (300 mg Oral Given 03/23/21 0855)  vitamin E capsule 400 Units (400 Units Oral Given 03/23/21 0855)  loratadine (CLARITIN) tablet 10 mg (10 mg Oral Given 03/23/21 0855)  fluticasone (FLONASE) 50 MCG/ACT nasal spray 2 spray (has no administration in time range)  heparin injection 5,000 Units (5,000 Units Subcutaneous Given 03/23/21 1239)  furosemide (LASIX) injection 40 mg (40 mg Intravenous Given 03/23/21 0856)  ondansetron (ZOFRAN)  tablet 4 mg (has no administration in time range)    Or  ondansetron (ZOFRAN) injection 4 mg (has no administration in time range)  furosemide (LASIX) injection 40 mg (40 mg Intravenous Given 03/22/21 1116)  albumin human 25 % solution 12.5 g (12.5 g Intravenous New Bag/Given 03/23/21 1238)    ED Course  I have reviewed the triage vital signs and the nursing notes.  Pertinent labs & imaging results that were available during my care of the patient were reviewed by me and considered in my medical decision making (see chart for details).    MDM Rules/Calculators/A&P                          BP (!) 180/86   Pulse 91   Temp 98 F (36.7 C) (Oral)   Resp 19   SpO2 90%   Final Clinical Impression(s) / ED Diagnoses Final diagnoses:  Acute on chronic congestive heart failure, unspecified heart failure type [facility])  Demand ischemia of myocardium ([place])    Rx / DC Orders ED Discharge Orders    None     10:28 AM Patient with history of CHF with an EF of 30-35 on echo that was performed in June of last year.  She is here with shortness of breath worse with laying with exertion suspicious of CHF exacerbation.  She does not have any active chest pain to  suggest ACS.  No pleuritic chest pain or cough to suggest PE.  She appears comfortable at rest.  She does have history of CKD, today her creatinine is 4.18 which is slightly elevated but similar to prior baseline.  Her BUN is 77, at baseline.  She is not hypoxic on room air.  BNP is greater than 4500.  12:35 PM She does have an elevated troponin of 28 without any chest pain.  Suspect demand ischemia.  EKG without acute changes.  Chest x-ray shows no significant edema however it looks like she may have a compression fracture in her thoracic spine.  On reexamination she does have some tenderness to the affected area therefore a dedicated thoracic x-ray have been ordered.  Has not resulted yet.  Appreciate consultation to [facility] hospitalist, Dr. Marylyn Ishihara,  who agrees to admit patient for further care. Pt did receive SL nitro and Lasix in the ER.    Domenic Moras, PA-C 03/23/21 1542    Arnaldo Natal, MD 03/24/21 [id number]

## 2021-03-23 DIAGNOSIS — R0602 Shortness of breath: Secondary | ICD-10-CM

## 2021-03-23 DIAGNOSIS — I10 Essential (primary) hypertension: Secondary | ICD-10-CM | POA: Diagnosis not present

## 2021-03-23 DIAGNOSIS — I5023 Acute on chronic systolic (congestive) heart failure: Secondary | ICD-10-CM | POA: Diagnosis not present

## 2021-03-23 DIAGNOSIS — N185 Chronic kidney disease, stage 5: Secondary | ICD-10-CM | POA: Diagnosis not present

## 2021-03-23 LAB — COMPREHENSIVE METABOLIC PANEL
ALT: 15 U/L (ref 0–44)
AST: 19 U/L (ref 15–41)
Albumin: 3.2 g/dL — ABNORMAL LOW (ref 3.5–5.0)
Alkaline Phosphatase: 77 U/L (ref 38–126)
Anion gap: 10 (ref 5–15)
BUN: 71 mg/dL — ABNORMAL HIGH (ref 8–23)
CO2: 21 mmol/L — ABNORMAL LOW (ref 22–32)
Calcium: 9.1 mg/dL (ref 8.9–10.3)
Chloride: 108 mmol/L (ref 98–111)
Creatinine, Ser: 3.86 mg/dL — ABNORMAL HIGH (ref 0.44–1.00)
GFR, Estimated: 11 mL/min — ABNORMAL LOW (ref >60)
Glucose, Bld: 89 mg/dL (ref 70–99)
Potassium: 4.7 mmol/L (ref 3.5–5.1)
Sodium: 139 mmol/L (ref 135–145)
Total Bilirubin: 1 mg/dL (ref 0.3–1.2)
Total Protein: 6.3 g/dL — ABNORMAL LOW (ref 6.5–8.1)

## 2021-03-23 LAB — CBC WITH DIFFERENTIAL/PLATELET
Abs Immature Granulocytes: 0.04 10*3/uL (ref 0.00–0.07)
Basophils Absolute: 0.1 10*3/uL (ref 0.0–0.1)
Basophils Relative: 1 %
Eosinophils Absolute: 0.1 10*3/uL (ref 0.0–0.5)
Eosinophils Relative: 2 %
HCT: 27.4 % — ABNORMAL LOW (ref 36.0–46.0)
Hemoglobin: 8.7 g/dL — ABNORMAL LOW (ref 12.0–15.0)
Immature Granulocytes: 1 %
Lymphocytes Relative: 12 %
Lymphs Abs: 0.7 10*3/uL (ref 0.7–4.0)
MCH: 30.7 pg (ref 26.0–34.0)
MCHC: 31.8 g/dL (ref 30.0–36.0)
MCV: 96.8 fL (ref 80.0–100.0)
Monocytes Absolute: 0.4 10*3/uL (ref 0.1–1.0)
Monocytes Relative: 8 %
Neutro Abs: 4.5 10*3/uL (ref 1.7–7.7)
Neutrophils Relative %: 76 %
Platelets: 219 10*3/uL (ref 150–400)
RBC: 2.83 MIL/uL — ABNORMAL LOW (ref 3.87–5.11)
RDW: 15.4 % (ref 11.5–15.5)
WBC: 5.8 10*3/uL (ref 4.0–10.5)
nRBC: 0 % (ref 0.0–0.2)

## 2021-03-23 MED ORDER — ALBUMIN HUMAN 25 % IV SOLN
12.5000 g | Freq: Once | INTRAVENOUS | Status: AC
  Administered 2021-03-23: 12.5 g via INTRAVENOUS
  Filled 2021-03-23: qty 50

## 2021-03-23 NOTE — Progress Notes (Signed)
PROGRESS NOTE    Alicia Sellers  GYI:[id number] DOB: 09-30-1929 DOA: 03/22/2021 PCP: Gaynelle Arabian, MD   Brief Narrative:  Per admitting physician: Allsion Nogales is a 87 y.o. female with medical history significant of anxiety, CKD5, HTN, hypothyroidism, HLD. Presenting with dyspnea. She has had dyspnea for the last week. It's worse with exertion. She has no chest pain. She has a non-productive cough with deep inspiration. She denies fevers and sick contacts. She reports being compliant on her lasix daily. She reports normal urination. She denies any weight gain or increase edema. She has tried rest to make things better, but this does not provide complete relieve. She notes that activity or laying flat makes things worse. She reports some nausea and general malaise. Her symptoms seemed significantly worse last night. So, she decided to come to the ED today. She denies any other aggravating or alleviating factors.     ED Course: CXR was negative for pulmonary disease. She was found to have a BNP of >45k. Her trp was minimally elevated. She was given lasix 40mg  IV x 1 and placed on O2. TRH was called for admission   Assessment & Plan:   Active Problems:   Acute on chronic systolic CHF (congestive heart failure) (HCC)   Acute on chronic systolic HF Dyspnea     - admitted to inpt, tele     - continue lasix 40mg  IV-convert to po monday     - daily wt, I&O     - echo reviewed     - cardiology consulted-appreciate their input     - better BP control  CKD5     - SCr, BUN and GFR @ baseline     - still making urine; acid-base/'lytes are ok     - follows w/ Dr. Justin Mend     - at this point, careful monitoring for renal function; currently necessary to increase lasix; low threshold to get nephro involved      - continue HCO3-  HTN     -Improved control     - at presentation SBPs 165 - 202; sure that is causing some strain on the heart     - continue home regimen; increase  hydralazine and metoprolol as tolerated  Hypothyroid     - continue home synthroid  HLD     - continue home lopid  Anxiety     - continue home regimen  DVT prophylaxis: Heparin SQ  Code Status: Full    Code Status Orders  (From admission, onward)         Start     Ordered   03/22/21 1409  Full code  Continuous        03/22/21 1408        Code Status History    Date Active Date Inactive Code Status Order ID Comments User Context   05/08/2020 2217 05/11/2020 1702 Full Code [id number]  Mendel Corning, MD ED   10/26/2019 1854 10/30/2019 2116 Full Code [id number]  [name], MD Inpatient   12/01/2018 1450 12/05/2018 1600 Full Code [id number]  Desiree Hane, MD Inpatient   Advance Care Planning Activity    Advance Directive Documentation   Flowsheet Row Most Recent Value  Type of Advance Directive Healthcare Power of Attorney, Living will  Pre-existing out of facility DNR order (yellow form or pink MOST form) --  "MOST" Form in Place? --     Family Communication: Spoke with daughter, answered all questions Disposition Plan:  Patient remained inpatient additional day for continued IV diuresis.  Patient is not yet medically stable for discharge. Consults called: None Admission status: Inpatient   Consultants:   Cardiology  Procedures:  DG Chest 2 View  Result Date: 03/22/2021 CLINICAL DATA:  Shortness of breath. EXAM: CHEST - 2 VIEW COMPARISON:  Chest x-rays dated 01/10/2021 and 05/08/2020 FINDINGS: Borderline cardiomegaly, stable. Chronic bronchitic changes centrally. No confluent opacity to suggest a developing pneumonia. No pleural effusion or pneumothorax is seen. Compression deformity of a vertebral body in the mid/lower thoracic spine, of uncertain age but not convincingly seen on previous study suggesting that it is new. IMPRESSION: 1. No active cardiopulmonary disease. No evidence of pneumonia or pulmonary edema. 2. Chronic bronchitic changes. 3. Questionable  new compression deformity of a vertebral body in the mid/lower thoracic spine, of uncertain age and possibly artifactual due to patient's pronounced scoliosis at the thoracolumbar junction. If any recent trauma or midline back pain, consider dedicated plain film of the thoracic spine for further characterization. Electronically Signed   By: Franki Cabot M.D.   On: 03/22/2021 10:55   DG Thoracic Spine 2 View  Result Date: 03/22/2021 CLINICAL DATA:  Shortness of breath on exertion. Assess for possible compression deformity of vertebral body in the midthoracic spine. EXAM: THORACIC SPINE 2 VIEWS COMPARISON:  Chest x-ray March 22, 2021, chest x-ray May 08, 2020 FINDINGS: Probable chronic compression deformities of at least 3 mid to lower thoracic vertebral bodies are identified without cortical displacement. These findings are minimally worsened compared to prior chest x-ray of May 08, 2020. There is no dislocation. Scoliosis of the spine is identified. IMPRESSION: Probable chronic compression deformities of at least 3 mid to lower thoracic vertebral bodies are identified without cortical displacement. These findings are minimally worsened compared to prior chest x-ray of May 08, 2020. Electronically Signed   By: Abelardo Diesel M.D.   On: 03/22/2021 12:43   ECHOCARDIOGRAM COMPLETE  Result Date: 03/22/2021    ECHOCARDIOGRAM REPORT   Patient Name:   Alicia Sellers [facility] Date of Exam: 03/22/2021 Medical Rec #:  [id number]         Height:       61.0 in Accession #:    [id number]        Weight:       110.0 lb Date of Birth:  1929/07/14         BSA:          1.465 m Patient Age:    40 years          BP:           176/85 mmHg Patient Gender: F                 HR:           90 bpm. Exam Location:  Inpatient Procedure: 2D Echo, Cardiac Doppler and Color Doppler Indications:     I50.23 Acute on chronic systolic (congestive) heart failure  History:         Patient has prior history of Echocardiogram examinations, most                   recent 05/10/2020. Signs/Symptoms:Dyspnea; Risk                  Factors:Hypertension. CKD. Hypothyroidism.  Sonographer:     Tiffany Dance Referring Phys:  [id number] Jonnie Finner Diagnosing Phys: [name] MD IMPRESSIONS  1. Left ventricular ejection fraction, by estimation, is  40 to 45%. The left ventricle has mild to moderately decreased function. The left ventricle demonstrates global hypokinesis. There is mild concentric left ventricular hypertrophy. Left ventricular  diastolic parameters are consistent with Grade I diastolic dysfunction (impaired relaxation).  2. Right ventricular systolic function is low normal. The right ventricular size is normal. There is moderately elevated pulmonary artery systolic pressure.  3. Left atrial size was severely dilated.  4. Right atrial size was mildly dilated.  5. The mitral valve is degenerative. Moderate to severe mitral valve regurgitation. Moderate mitral annular calcification.  6. Tricuspid valve regurgitation is moderate.  7. The aortic valve is tricuspid. There is moderate calcification of the aortic valve. There is mild thickening of the aortic valve. Aortic valve regurgitation is not visualized. Mild to moderate aortic valve sclerosis/calcification is present, without any evidence of aortic stenosis.  8. There is mild (Grade II) atheroma plaque involving the aortic root and ascending aorta.  9. The inferior vena cava is normal in size with greater than 50% respiratory variability, suggesting right atrial pressure of 3 mmHg. FINDINGS  Left Ventricle: Left ventricular ejection fraction, by estimation, is 40 to 45%. The left ventricle has mild to moderately decreased function. The left ventricle demonstrates global hypokinesis. The left ventricular internal cavity size was normal in size. There is mild concentric left ventricular hypertrophy. Left ventricular diastolic parameters are consistent with Grade I diastolic dysfunction (impaired relaxation). Right  Ventricle: The right ventricular size is normal. No increase in right ventricular wall thickness. Right ventricular systolic function is low normal. There is moderately elevated pulmonary artery systolic pressure. The tricuspid regurgitant velocity  is 3.53 m/s, and with an assumed right atrial pressure of 3 mmHg, the estimated right ventricular systolic pressure is 19.4 mmHg. Left Atrium: Left atrial size was severely dilated. Right Atrium: Right atrial size was mildly dilated. Pericardium: There is no evidence of pericardial effusion. Mitral Valve: The mitral valve is degenerative in appearance. There is moderate thickening of the mitral valve leaflet(s). There is moderate calcification of the mitral valve leaflet(s). Moderate mitral annular calcification. Moderate to severe mitral valve regurgitation. Tricuspid Valve: The tricuspid valve is normal in structure. Tricuspid valve regurgitation is moderate. Aortic Valve: The aortic valve is tricuspid. There is moderate calcification of the aortic valve. There is mild thickening of the aortic valve. There is moderate aortic valve annular calcification. Aortic valve regurgitation is not visualized. Aortic regurgitation PHT measures 439 msec. Mild to moderate aortic valve sclerosis/calcification is present, without any evidence of aortic stenosis. Pulmonic Valve: The pulmonic valve was normal in structure. Pulmonic valve regurgitation is mild. Aorta: The aortic root is normal in size and structure. There is mild (Grade II) atheroma plaque involving the aortic root and ascending aorta. Venous: The inferior vena cava is normal in size with greater than 50% respiratory variability, suggesting right atrial pressure of 3 mmHg. IAS/Shunts: No atrial level shunt detected by color flow Doppler.  LEFT VENTRICLE PLAX 2D LVIDd:         4.40 cm  Diastology LVIDs:         3.40 cm  LV e' medial:    3.26 cm/s LV PW:         1.30 cm  LV E/e' medial:  29.7 LV IVS:        1.10 cm  LV e'  lateral:   3.92 cm/s LVOT diam:     1.90 cm  LV E/e' lateral: 24.7 LV SV:  30 LV SV Index:   20 LVOT Area:     2.84 cm  RIGHT VENTRICLE            IVC RV Basal diam:  2.50 cm    IVC diam: 1.80 cm RV S prime:     8.81 cm/s TAPSE (M-mode): 2.4 cm LEFT ATRIUM             Index       RIGHT ATRIUM           Index LA diam:        4.70 cm 3.21 cm/m  RA Area:     11.90 cm LA Vol (A2C):   84.6 ml 57.75 ml/m RA Volume:   23.70 ml  16.18 ml/m LA Vol (A4C):   47.0 ml 32.08 ml/m LA Biplane Vol: 63.5 ml 43.35 ml/m  AORTIC VALVE LVOT Vmax:   55.80 cm/s LVOT Vmean:  38.800 cm/s LVOT VTI:    0.105 m AI PHT:      439 msec  AORTA Ao Root diam: 2.60 cm Ao Asc diam:  3.30 cm MITRAL VALVE                TRICUSPID VALVE MV Area (PHT): 3.85 cm     TR Peak grad:   49.8 mmHg MV Decel Time: 197 msec     TR Vmax:        353.00 cm/s MV E velocity: 96.70 cm/s MV A velocity: 104.00 cm/s  SHUNTS MV E/A ratio:  0.93         Systemic VTI:  0.10 m                             Systemic Diam: 1.90 cm [name] MD Electronically signed by [name] MD Signature Date/Time: 03/22/2021/4:54:32 PM    Final      Antimicrobials:   None   Subjective: Patient reports of breathing continues to improve not yet at baseline but significantly improved from admission  Objective: Vitals:   03/22/21 1836 03/22/21 2200 03/23/21 0200 03/23/21 0500  BP: (!) 152/68 (!) 165/81 (!) 157/72   Pulse: 74 76 68   Resp: 18 18 18    Temp: 97.9 F (36.6 C) 98 F (36.7 C) 98.9 F (37.2 C)   TempSrc: Oral Oral Oral   SpO2: 96% 99% 94%   Weight:    54.7 kg  Height:        Intake/Output Summary (Last 24 hours) at 03/23/2021 1056 Last data filed at 03/23/2021 1030 Gross per 24 hour  Intake 240 ml  Output 525 ml  Net -285 ml   Filed Weights   03/22/21 1500 03/23/21 0500  Weight: 55.2 kg 54.7 kg    Examination:  General exam: Appears calm and comfortable  Respiratory system: Mild rales bilaterally, no rhonchi no accessory muscle  use. Cardiovascular system: S1 & S2 heard, RRR. No JVD, murmurs, rubs, gallops or clicks. No pedal edema. Gastrointestinal system: Abdomen is nondistended, soft and nontender. No organomegaly or masses felt. Normal bowel sounds heard. Central nervous system: Alert and oriented. No focal neurological deficits. Extremities: Symmetric 5 x 5 power, warm well perfused no edema. Skin: No rashes, lesions or ulcers Psychiatry: Judgement and insight appear normal. Mood & affect appropriate.     Data Reviewed: I have personally reviewed following labs and imaging studies  CBC: Recent Labs  Lab 03/21/21 1122 03/22/21 1123 03/23/21 0825  WBC  --  5.6 5.8  NEUTROABS  --   --  4.5  HGB 8.7* 9.2* 8.7*  HCT  --  28.8* 27.4*  MCV  --  98.3 96.8  PLT  --  235 329   Basic Metabolic Panel: Recent Labs  Lab 03/22/21 0957 03/23/21 0825  NA 140 139  K 4.8 4.7  CL 107 108  CO2 21* 21*  GLUCOSE 101* 89  BUN 77* 71*  CREATININE 4.18* 3.86*  CALCIUM 9.3 9.1   GFR: Estimated Creatinine Clearance: 7.5 mL/min (A) (by C-G formula based on SCr of 3.86 mg/dL (H)). Liver Function Tests: Recent Labs  Lab 03/23/21 0825  AST 19  ALT 15  ALKPHOS 77  BILITOT 1.0  PROT 6.3*  ALBUMIN 3.2*   No results for input(s): LIPASE, AMYLASE in the last 168 hours. No results for input(s): AMMONIA in the last 168 hours. Coagulation Profile: No results for input(s): INR, PROTIME in the last 168 hours. Cardiac Enzymes: No results for input(s): CKTOTAL, CKMB, CKMBINDEX, TROPONINI in the last 168 hours. BNP (last 3 results) No results for input(s): PROBNP in the last 8760 hours. HbA1C: No results for input(s): HGBA1C in the last 72 hours. CBG: No results for input(s): GLUCAP in the last 168 hours. Lipid Profile: No results for input(s): CHOL, HDL, LDLCALC, TRIG, CHOLHDL, LDLDIRECT in the last 72 hours. Thyroid Function Tests: No results for input(s): TSH, T4TOTAL, FREET4, T3FREE, THYROIDAB in the last 72  hours. Anemia Panel: No results for input(s): VITAMINB12, FOLATE, FERRITIN, TIBC, IRON, RETICCTPCT in the last 72 hours. Sepsis Labs: No results for input(s): PROCALCITON, LATICACIDVEN in the last 168 hours.  Recent Results (from the past 240 hour(s))  SARS CORONAVIRUS 2 (TAT 6-24 HRS) Nasopharyngeal Nasopharyngeal Swab     Status: None   Collection Time: 03/22/21 12:46 PM   Specimen: Nasopharyngeal Swab  Result Value Ref Range Status   SARS Coronavirus 2 NEGATIVE NEGATIVE Final    Comment: (NOTE) SARS-CoV-2 target nucleic acids are NOT DETECTED.  The SARS-CoV-2 RNA is generally detectable in upper and lower respiratory specimens during the acute phase of infection. Negative results do not preclude SARS-CoV-2 infection, do not rule out co-infections with other pathogens, and should not be used as the sole basis for treatment or other patient management decisions. Negative results must be combined with clinical observations, patient history, and epidemiological information. The expected result is Negative.  Fact Sheet for Patients: SugarRoll.be  Fact Sheet for Healthcare Providers: https://www.woods-mathews.com/  This test is not yet approved or cleared by the [place] FDA and  has been authorized for detection and/or diagnosis of SARS-CoV-2 by FDA under an Emergency Use Authorization (EUA). This EUA will remain  in effect (meaning this test can be used) for the duration of the COVID-19 declaration under Se ction 564(b)(1) of the Act, 21 U.S.C. section 360bbb-3(b)(1), unless the authorization is terminated or revoked sooner.  Performed at [facility], [place] [address], [place], [place] [address]          Radiology Studies: DG Chest 2 View  Result Date: 03/22/2021 CLINICAL DATA:  Shortness of breath. EXAM: CHEST - 2 VIEW COMPARISON:  Chest x-rays dated 01/10/2021 and 05/08/2020 FINDINGS: Borderline cardiomegaly, stable.  Chronic bronchitic changes centrally. No confluent opacity to suggest a developing pneumonia. No pleural effusion or pneumothorax is seen. Compression deformity of a vertebral body in the mid/lower thoracic spine, of uncertain age but not convincingly seen on previous study suggesting that it is new. IMPRESSION: 1. No  active cardiopulmonary disease. No evidence of pneumonia or pulmonary edema. 2. Chronic bronchitic changes. 3. Questionable new compression deformity of a vertebral body in the mid/lower thoracic spine, of uncertain age and possibly artifactual due to patient's pronounced scoliosis at the thoracolumbar junction. If any recent trauma or midline back pain, consider dedicated plain film of the thoracic spine for further characterization. Electronically Signed   By: Franki Cabot M.D.   On: 03/22/2021 10:55   DG Thoracic Spine 2 View  Result Date: 03/22/2021 CLINICAL DATA:  Shortness of breath on exertion. Assess for possible compression deformity of vertebral body in the midthoracic spine. EXAM: THORACIC SPINE 2 VIEWS COMPARISON:  Chest x-ray March 22, 2021, chest x-ray May 08, 2020 FINDINGS: Probable chronic compression deformities of at least 3 mid to lower thoracic vertebral bodies are identified without cortical displacement. These findings are minimally worsened compared to prior chest x-ray of May 08, 2020. There is no dislocation. Scoliosis of the spine is identified. IMPRESSION: Probable chronic compression deformities of at least 3 mid to lower thoracic vertebral bodies are identified without cortical displacement. These findings are minimally worsened compared to prior chest x-ray of May 08, 2020. Electronically Signed   By: Abelardo Diesel M.D.   On: 03/22/2021 12:43   ECHOCARDIOGRAM COMPLETE  Result Date: 03/22/2021    ECHOCARDIOGRAM REPORT   Patient Name:   Alicia Sellers [facility] Date of Exam: 03/22/2021 Medical Rec #:  [id number]         Height:       61.0 in Accession #:    [id number]         Weight:       110.0 lb Date of Birth:  1929-09-13         BSA:          1.465 m Patient Age:    26 years          BP:           176/85 mmHg Patient Gender: F                 HR:           90 bpm. Exam Location:  Inpatient Procedure: 2D Echo, Cardiac Doppler and Color Doppler Indications:     I50.23 Acute on chronic systolic (congestive) heart failure  History:         Patient has prior history of Echocardiogram examinations, most                  recent 05/10/2020. Signs/Symptoms:Dyspnea; Risk                  Factors:Hypertension. CKD. Hypothyroidism.  Sonographer:     Tiffany Dance Referring Phys:  [id number] Jonnie Finner Diagnosing Phys: [name] MD IMPRESSIONS  1. Left ventricular ejection fraction, by estimation, is 40 to 45%. The left ventricle has mild to moderately decreased function. The left ventricle demonstrates global hypokinesis. There is mild concentric left ventricular hypertrophy. Left ventricular  diastolic parameters are consistent with Grade I diastolic dysfunction (impaired relaxation).  2. Right ventricular systolic function is low normal. The right ventricular size is normal. There is moderately elevated pulmonary artery systolic pressure.  3. Left atrial size was severely dilated.  4. Right atrial size was mildly dilated.  5. The mitral valve is degenerative. Moderate to severe mitral valve regurgitation. Moderate mitral annular calcification.  6. Tricuspid valve regurgitation is moderate.  7. The aortic valve is tricuspid. There is moderate calcification  of the aortic valve. There is mild thickening of the aortic valve. Aortic valve regurgitation is not visualized. Mild to moderate aortic valve sclerosis/calcification is present, without any evidence of aortic stenosis.  8. There is mild (Grade II) atheroma plaque involving the aortic root and ascending aorta.  9. The inferior vena cava is normal in size with greater than 50% respiratory variability, suggesting right atrial pressure of 3  mmHg. FINDINGS  Left Ventricle: Left ventricular ejection fraction, by estimation, is 40 to 45%. The left ventricle has mild to moderately decreased function. The left ventricle demonstrates global hypokinesis. The left ventricular internal cavity size was normal in size. There is mild concentric left ventricular hypertrophy. Left ventricular diastolic parameters are consistent with Grade I diastolic dysfunction (impaired relaxation). Right Ventricle: The right ventricular size is normal. No increase in right ventricular wall thickness. Right ventricular systolic function is low normal. There is moderately elevated pulmonary artery systolic pressure. The tricuspid regurgitant velocity  is 3.53 m/s, and with an assumed right atrial pressure of 3 mmHg, the estimated right ventricular systolic pressure is 64.4 mmHg. Left Atrium: Left atrial size was severely dilated. Right Atrium: Right atrial size was mildly dilated. Pericardium: There is no evidence of pericardial effusion. Mitral Valve: The mitral valve is degenerative in appearance. There is moderate thickening of the mitral valve leaflet(s). There is moderate calcification of the mitral valve leaflet(s). Moderate mitral annular calcification. Moderate to severe mitral valve regurgitation. Tricuspid Valve: The tricuspid valve is normal in structure. Tricuspid valve regurgitation is moderate. Aortic Valve: The aortic valve is tricuspid. There is moderate calcification of the aortic valve. There is mild thickening of the aortic valve. There is moderate aortic valve annular calcification. Aortic valve regurgitation is not visualized. Aortic regurgitation PHT measures 439 msec. Mild to moderate aortic valve sclerosis/calcification is present, without any evidence of aortic stenosis. Pulmonic Valve: The pulmonic valve was normal in structure. Pulmonic valve regurgitation is mild. Aorta: The aortic root is normal in size and structure. There is mild (Grade II) atheroma  plaque involving the aortic root and ascending aorta. Venous: The inferior vena cava is normal in size with greater than 50% respiratory variability, suggesting right atrial pressure of 3 mmHg. IAS/Shunts: No atrial level shunt detected by color flow Doppler.  LEFT VENTRICLE PLAX 2D LVIDd:         4.40 cm  Diastology LVIDs:         3.40 cm  LV e' medial:    3.26 cm/s LV PW:         1.30 cm  LV E/e' medial:  29.7 LV IVS:        1.10 cm  LV e' lateral:   3.92 cm/s LVOT diam:     1.90 cm  LV E/e' lateral: 24.7 LV SV:         30 LV SV Index:   20 LVOT Area:     2.84 cm  RIGHT VENTRICLE            IVC RV Basal diam:  2.50 cm    IVC diam: 1.80 cm RV S prime:     8.81 cm/s TAPSE (M-mode): 2.4 cm LEFT ATRIUM             Index       RIGHT ATRIUM           Index LA diam:        4.70 cm 3.21 cm/m  RA Area:     11.90 cm  LA Vol (A2C):   84.6 ml 57.75 ml/m RA Volume:   23.70 ml  16.18 ml/m LA Vol (A4C):   47.0 ml 32.08 ml/m LA Biplane Vol: 63.5 ml 43.35 ml/m  AORTIC VALVE LVOT Vmax:   55.80 cm/s LVOT Vmean:  38.800 cm/s LVOT VTI:    0.105 m AI PHT:      439 msec  AORTA Ao Root diam: 2.60 cm Ao Asc diam:  3.30 cm MITRAL VALVE                TRICUSPID VALVE MV Area (PHT): 3.85 cm     TR Peak grad:   49.8 mmHg MV Decel Time: 197 msec     TR Vmax:        353.00 cm/s MV E velocity: 96.70 cm/s MV A velocity: 104.00 cm/s  SHUNTS MV E/A ratio:  0.93         Systemic VTI:  0.10 m                             Systemic Diam: 1.90 cm [name] MD Electronically signed by [name] MD Signature Date/Time: 03/22/2021/4:54:32 PM    Final         Scheduled Meds: . amLODipine  5 mg Oral BID  . aspirin EC  81 mg Oral Daily  . citalopram  10 mg Oral Daily  . furosemide  40 mg Intravenous Daily  . gemfibrozil  600 mg Oral BID AC  . heparin  5,000 Units Subcutaneous Q8H  . hydrALAZINE  50 mg Oral Q8H  . levothyroxine  50 mcg Oral QAC breakfast  . loratadine  10 mg Oral Daily  . LORazepam  0.25 mg Oral QHS  . metoprolol  succinate  12.5 mg Oral QPM  . sodium bicarbonate  650 mg Oral TID  . ursodiol  300 mg Oral BID  . vitamin E  400 Units Oral Daily   Continuous Infusions: . albumin human       LOS: 1 day    Time spent: 35 minutes    Nicolette Bang, MD [facility]  If 7PM-7AM, please contact night-coverage  03/23/2021, 10:56 AM

## 2021-03-23 NOTE — Consult Note (Signed)
Ref: Alicia Arabian, MD   Subjective:  VS stable. Afebrile. Mild respiratory distress at rest. Blood work stable post little diuresis. Creatinine is 3.86 mg.   Objective:  Vital Signs in the last 24 hours: Temp:  [97.8 F (36.6 C)-98.9 F (37.2 C)] 98.9 F (37.2 C) (04/17 0200) Pulse Rate:  [65-93] 68 (04/17 0200) Cardiac Rhythm: Normal sinus rhythm (04/17 0700) Resp:  [14-28] 18 (04/17 0200) BP: (152-202)/(68-104) 157/72 (04/17 0200) SpO2:  [87 %-99 %] 94 % (04/17 0200) Weight:  [54.7 kg-55.2 kg] 54.7 kg (04/17 0500)  Physical Exam: BP Readings from Last 1 Encounters:  03/23/21 (!) 157/72     Wt Readings from Last 1 Encounters:  03/23/21 54.7 kg    Weight change:  Body mass index is 22.79 kg/m. HEENT: [place]/AT, Eyes-Blue, Conjunctiva-Pale, Sclera-Non-icteric Neck: No JVD, No bruit, Trachea midline. Lungs:  Clearing, Bilateral. Cardiac:  Regular rhythm, normal S1 and S2, no S3. II/VI systolic murmur. Abdomen:  Soft, non-tender. BS present. Extremities:  No edema present. No cyanosis. No clubbing. CNS: AxOx3, Cranial nerves grossly intact, moves all 4 extremities.  Skin: Warm and dry.   Intake/Output from previous day: 04/16 0701 - 04/17 0700 In: 240 [P.O.:240] Out: 275 [Urine:275]    Lab Results: BMET    Component Value Date/Time   NA 139 03/23/2021 0825   NA 140 03/22/2021 0957   NA 136 05/11/2020 0507   K 4.7 03/23/2021 0825   K 4.8 03/22/2021 0957   K 4.1 05/11/2020 0507   CL 108 03/23/2021 0825   CL 107 03/22/2021 0957   CL 110 05/11/2020 0507   CO2 21 (L) 03/23/2021 0825   CO2 21 (L) 03/22/2021 0957   CO2 14 (L) 05/11/2020 0507   GLUCOSE 89 03/23/2021 0825   GLUCOSE 101 (H) 03/22/2021 0957   GLUCOSE 89 05/11/2020 0507   BUN 71 (H) 03/23/2021 0825   BUN 77 (H) 03/22/2021 0957   BUN 61 (H) 05/11/2020 0507   CREATININE 3.86 (H) 03/23/2021 0825   CREATININE 4.18 (H) 03/22/2021 0957   CREATININE 3.63 (H) 05/11/2020 0507   CALCIUM 9.1 03/23/2021  0825   CALCIUM 9.3 03/22/2021 0957   CALCIUM 8.1 (L) 05/11/2020 0507   GFRNONAA 11 (L) 03/23/2021 0825   GFRNONAA 10 (L) 03/22/2021 0957   GFRNONAA 11 (L) 05/11/2020 0507   GFRNONAA 9 (L) 05/10/2020 0522   GFRNONAA 9 (L) 05/09/2020 0525   GFRAA 12 (L) 05/11/2020 0507   GFRAA 11 (L) 05/10/2020 0522   GFRAA 10 (L) 05/09/2020 0525   CBC    Component Value Date/Time   WBC 5.8 03/23/2021 0825   RBC 2.83 (L) 03/23/2021 0825   HGB 8.7 (L) 03/23/2021 0825   HCT 27.4 (L) 03/23/2021 0825   PLT 219 03/23/2021 0825   MCV 96.8 03/23/2021 0825   MCH 30.7 03/23/2021 0825   MCHC 31.8 03/23/2021 0825   RDW 15.4 03/23/2021 0825   LYMPHSABS 0.7 03/23/2021 0825   MONOABS 0.4 03/23/2021 0825   EOSABS 0.1 03/23/2021 0825   BASOSABS 0.1 03/23/2021 0825   HEPATIC Function Panel Recent Labs    05/10/20 0522 05/11/20 0507 03/23/21 0825  PROT 6.0* 5.9* 6.3*   HEMOGLOBIN A1C No components found for: HGA1C,  MPG CARDIAC ENZYMES Lab Results  Component Value Date   TROPONINI <0.03 12/01/2018   BNP No results for input(s): PROBNP in the last 8760 hours. TSH Recent Labs    05/09/20 0525  TSH 2.342   CHOLESTEROL No results for input(s):  CHOL in the last 8760 hours.  Scheduled Meds: . amLODipine  5 mg Oral BID  . aspirin EC  81 mg Oral Daily  . citalopram  10 mg Oral Daily  . furosemide  40 mg Intravenous Daily  . gemfibrozil  600 mg Oral BID AC  . heparin  5,000 Units Subcutaneous Q8H  . hydrALAZINE  50 mg Oral Q8H  . levothyroxine  50 mcg Oral QAC breakfast  . loratadine  10 mg Oral Daily  . LORazepam  0.25 mg Oral QHS  . metoprolol succinate  12.5 mg Oral QPM  . sodium bicarbonate  650 mg Oral TID  . ursodiol  300 mg Oral BID  . 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 Oral 40 mg. Furosemide form tomorrow. Increase activity. Check oxygen saturation for possible home use.   LOS: 1 day   Time spent including chart review, lab review, examination, discussion with patient/doctor : 30 min   [name]  MD  03/23/2021, 9:44 AM

## 2021-03-23 NOTE — Plan of Care (Signed)
?  Problem: Education: ?Goal: Ability to demonstrate management of disease process will improve ?Outcome: Progressing ?  ?Problem: Activity: ?Goal: Capacity to carry out activities will improve ?Outcome: Progressing ?  ?Problem: Cardiac: ?Goal: Ability to achieve and maintain adequate cardiopulmonary perfusion will improve ?Outcome: Progressing ?  ?

## 2021-03-24 DIAGNOSIS — E785 Hyperlipidemia, unspecified: Secondary | ICD-10-CM

## 2021-03-24 DIAGNOSIS — F419 Anxiety disorder, unspecified: Secondary | ICD-10-CM

## 2021-03-24 DIAGNOSIS — I5023 Acute on chronic systolic (congestive) heart failure: Secondary | ICD-10-CM | POA: Diagnosis not present

## 2021-03-24 DIAGNOSIS — N185 Chronic kidney disease, stage 5: Secondary | ICD-10-CM | POA: Diagnosis not present

## 2021-03-24 DIAGNOSIS — E039 Hypothyroidism, unspecified: Secondary | ICD-10-CM

## 2021-03-24 DIAGNOSIS — I10 Essential (primary) hypertension: Secondary | ICD-10-CM | POA: Diagnosis not present

## 2021-03-24 LAB — COMPREHENSIVE METABOLIC PANEL
ALT: 15 U/L (ref 0–44)
AST: 19 U/L (ref 15–41)
Albumin: 3.3 g/dL — ABNORMAL LOW (ref 3.5–5.0)
Alkaline Phosphatase: 73 U/L (ref 38–126)
Anion gap: 12 (ref 5–15)
BUN: 79 mg/dL — ABNORMAL HIGH (ref 8–23)
CO2: 22 mmol/L (ref 22–32)
Calcium: 9 mg/dL (ref 8.9–10.3)
Chloride: 105 mmol/L (ref 98–111)
Creatinine, Ser: 4.44 mg/dL — ABNORMAL HIGH (ref 0.44–1.00)
GFR, Estimated: 9 mL/min — ABNORMAL LOW (ref >60)
Glucose, Bld: 87 mg/dL (ref 70–99)
Potassium: 4.5 mmol/L (ref 3.5–5.1)
Sodium: 139 mmol/L (ref 135–145)
Total Bilirubin: 1 mg/dL (ref 0.3–1.2)
Total Protein: 6.4 g/dL — ABNORMAL LOW (ref 6.5–8.1)

## 2021-03-24 NOTE — Discharge Summary (Signed)
Physician Discharge Summary  Alicia Sellers LEX:[id number] DOB: [birth year] DOA: 03/22/2021  PCP: Alicia Arabian, MD  Admit date: 03/22/2021 Discharge date: 03/24/2021  Admitted From: Inpatient Disposition: home  Recommendations for Outpatient Follow-up:  1. Follow up with PCP in 1-2 weeks   Home Health:No Equipment/Devices:no new equip  Discharge Condition:Stable CODE STATUS:Full code Diet recommendation: Cardiac diet  Brief/Interim Summary:  Per admitting physician: Alicia Beutler Smithis a 87 y.o.femalewith medical history significant ofanxiety, CKD5, HTN, hypothyroidism, HLD. Presenting with dyspnea. She has had dyspnea for the last week. It's worse with exertion. She has no chest pain. She has a non-productive cough with deep inspiration. She denies fevers and sick contacts. She reports being compliant on her lasix daily. She reports normal urination. She denies any weight gain or increase edema. She has tried rest to make things better, but this does not provide complete relieve. She notes that activity or laying flat makes things worse. She reports some nausea and general malaise. Her symptoms seemed significantly worse last night. So, she decided to come to the ED today. She denies any other aggravating or alleviating factors.  ED Course:CXR was negative for pulmonary disease. She was found to have a BNP of >45k. Her trp was minimally elevated. She was given lasix 40mg  IV x 1 and placed on O2. TRH was called for admission  Hospital course: Acute on chronic systolic HF Dyspnea - admitted to inpt, tele no acute abnormal rhythms noted - continued lasix 40mg  IV while in the hospital, resume p.o. on discharge - daily wt, I&O - echo reviewed, EF 40 to 45% - cardiology consulted-appreciate their input -patient diuresed nicely is at baseline.     -O2 sats normal on room air with exertion no indication for acute home O2     -patient is not on an ACE or  an ARB because of chronic kidney disease  CKD5 - SCr, BUN and GFR @ baseline - still making urine; acid-base/'lytes are ok - follows w/ Dr. Justin Sellers - continue HCO3-  HTN -Improved control - at presentation SBPs 165 - 202; sure that is causing some strain on the heart - continue home regimen; increase hydralazine and metoprolol as tolerated, as above no ACE or ARB because of CKD  Hypothyroid - continue home synthroid on discharge  HLD - continue home lopid on discharge  Anxiety - continue home regimen on discharge  Discharge Diagnoses:  Active Problems:   Acute on chronic systolic CHF (congestive heart failure) [facility])    Discharge Instructions  Discharge Instructions    Call MD for:  difficulty breathing, headache or visual disturbances   Complete by: As directed    Call MD for:  persistant dizziness or light-headedness   Complete by: As directed    Call MD for:  persistant nausea and vomiting   Complete by: As directed    Call MD for:  redness, tenderness, or signs of infection (pain, swelling, redness, odor or green/yellow discharge around incision site)   Complete by: As directed    Call MD for:  severe uncontrolled pain   Complete by: As directed    Call MD for:  temperature >100.4   Complete by: As directed    Diet - low sodium heart healthy   Complete by: As directed    Increase activity slowly   Complete by: As directed      Allergies as of 03/24/2021      Reactions   Latex Rash   Ace Inhibitors  Other reaction(s): cough   Atorvastatin    Other reaction(s): aches   Chlorthalidone    Other reaction(s): elevated creatanine   Ciprofloxacin    Other reaction(s): nausea (05/2013)   Fosamax [alendronate]    Other reaction(s): leg pain   Pravachol [pravastatin]    Other reaction(s): aches   Penicillins Rash   DID THE REACTION INVOLVE: Swelling of the face/tongue/throat, SOB, or low BP? No Sudden or severe  rash/hives, skin peeling, or the inside of the mouth or nose? No Did it require medical treatment? No When did it last happen? If all above answers are "NO", may proceed with cephalosporin use.      Medication List    STOP taking these medications   cephALEXin 500 MG capsule Commonly known as: KEFLEX     TAKE these medications   acetaminophen 500 MG tablet Commonly known as: TYLENOL Take 1,000 mg by mouth every 6 (six) hours as needed for mild pain.   amLODipine 10 MG tablet Commonly known as: NORVASC Take 5 mg by mouth 2 (two) times daily.   aspirin 81 MG EC tablet Take 81 mg by mouth daily.   BENEFIBER DRINK MIX PO Take 1 Scoop by mouth daily as needed (constipation).   citalopram 10 MG tablet Commonly known as: CELEXA Take 10 mg by mouth daily.   diclofenac Sodium 1 % Gel Commonly known as: VOLTAREN Apply 2 g topically 4 (four) times daily. What changed:   when to take this  reasons to take this   fexofenadine 180 MG tablet Commonly known as: ALLEGRA Take 180 mg by mouth daily as needed for allergies.   Fish Oil 1200 MG Caps Take 1,200 mg by mouth in the morning and at bedtime.   fluticasone 50 MCG/ACT nasal spray Commonly known as: FLONASE Place 2 sprays into both nostrils daily as needed for allergies.   furosemide 20 MG tablet Commonly known as: LASIX Take 20 mg by mouth daily.   gemfibrozil 600 MG tablet Commonly known as: LOPID Take 600 mg by mouth 2 (two) times daily before a meal.   hydrALAZINE 25 MG tablet Commonly known as: APRESOLINE Take 50 mg by mouth in the morning and at bedtime.   levothyroxine 50 MCG tablet Commonly known as: SYNTHROID Take 50 mcg by mouth daily before breakfast.   LORazepam 0.5 MG tablet Commonly known as: ATIVAN Take 0.25 mg by mouth at bedtime.   meclizine 25 MG tablet Commonly known as: ANTIVERT Take 25 mg by mouth 3 (three) times daily as needed for dizziness (vertigo).   metoprolol succinate 25  MG 24 hr tablet Commonly known as: TOPROL-XL Take 12.5 mg by mouth every evening.   ondansetron 4 MG disintegrating tablet Commonly known as: ZOFRAN-ODT Take 4 mg by mouth every 8 (eight) hours as needed for nausea.   ondansetron 8 MG tablet Commonly known as: Zofran Take 1 tablet (8 mg total) by mouth every 8 (eight) hours as needed for nausea or vomiting.   polyethylene glycol 17 g packet Commonly known as: MIRALAX / GLYCOLAX Take 17 g by mouth daily as needed for mild constipation or moderate constipation.   Procrit 4000 UNIT/ML injection Generic drug: epoetin alfa   Red Yeast Rice Extract 600 MG Caps Take 600 mg by mouth 2 (two) times daily.   sodium bicarbonate 650 MG tablet Take 650 mg by mouth in the morning, at noon, and at bedtime.   traMADol 50 MG tablet Commonly known as: ULTRAM Take 50 mg by  mouth every 12 (twelve) hours as needed for moderate pain.   ursodiol 300 MG capsule Commonly known as: ACTIGALL Take 300 mg by mouth 2 (two) times daily.   vitamin E 180 MG (400 UNITS) capsule Take 400 Units by mouth daily.       Allergies  Allergen Reactions  . Latex Rash  . Ace Inhibitors     Other reaction(s): cough  . Atorvastatin     Other reaction(s): aches  . Chlorthalidone     Other reaction(s): elevated creatanine  . Ciprofloxacin     Other reaction(s): nausea (05/2013)  . Fosamax [Alendronate]     Other reaction(s): leg pain  . Pravachol [Pravastatin]     Other reaction(s): aches  . Penicillins Rash    DID THE REACTION INVOLVE: Swelling of the face/tongue/throat, SOB, or low BP? No Sudden or severe rash/hives, skin peeling, or the inside of the mouth or nose? No Did it require medical treatment? No When did it last happen? If all above answers are "NO", may proceed with cephalosporin use.    Consultations:  None   Procedures/Studies: DG Chest 2 View  Result Date: 03/22/2021 CLINICAL DATA:  Shortness of breath. EXAM: CHEST - 2 VIEW  COMPARISON:  Chest x-rays dated 01/10/2021 and 05/08/2020 FINDINGS: Borderline cardiomegaly, stable. Chronic bronchitic changes centrally. No confluent opacity to suggest a developing pneumonia. No pleural effusion or pneumothorax is seen. Compression deformity of a vertebral body in the mid/lower thoracic spine, of uncertain age but not convincingly seen on previous study suggesting that it is new. IMPRESSION: 1. No active cardiopulmonary disease. No evidence of pneumonia or pulmonary edema. 2. Chronic bronchitic changes. 3. Questionable new compression deformity of a vertebral body in the mid/lower thoracic spine, of uncertain age and possibly artifactual due to patient's pronounced scoliosis at the thoracolumbar junction. If any recent trauma or midline back pain, consider dedicated plain film of the thoracic spine for further characterization. Electronically Signed   By: Franki Cabot M.D.   On: 03/22/2021 10:55   DG Thoracic Spine 2 View  Result Date: 03/22/2021 CLINICAL DATA:  Shortness of breath on exertion. Assess for possible compression deformity of vertebral body in the midthoracic spine. EXAM: THORACIC SPINE 2 VIEWS COMPARISON:  Chest x-ray March 22, 2021, chest x-ray May 08, 2020 FINDINGS: Probable chronic compression deformities of at least 3 mid to lower thoracic vertebral bodies are identified without cortical displacement. These findings are minimally worsened compared to prior chest x-ray of May 08, 2020. There is no dislocation. Scoliosis of the spine is identified. IMPRESSION: Probable chronic compression deformities of at least 3 mid to lower thoracic vertebral bodies are identified without cortical displacement. These findings are minimally worsened compared to prior chest x-ray of May 08, 2020. Electronically Signed   By: Abelardo Diesel M.D.   On: 03/22/2021 12:43   ECHOCARDIOGRAM COMPLETE  Result Date: 03/22/2021    ECHOCARDIOGRAM REPORT   Patient Name:   SAGAN WURZEL [facility] Date of  Exam: 03/22/2021 Medical Rec #:  [id number]         Height:       61.0 in Accession #:    [id number]        Weight:       110.0 lb Date of Birth:  [birth year]         BSA:          1.465 m Patient Age:    87 years          BP:  176/85 mmHg Patient Gender: F                 HR:           90 bpm. Exam Location:  Inpatient Procedure: 2D Echo, Cardiac Doppler and Color Doppler Indications:     I50.23 Acute on chronic systolic (congestive) heart failure  History:         Patient has prior history of Echocardiogram examinations, most                  recent 05/10/2020. Signs/Symptoms:Dyspnea; Risk                  Factors:Hypertension. CKD. Hypothyroidism.  Sonographer:     Tiffany Dance Referring Phys:  [id number] Jonnie Finner Diagnosing Phys: [name] MD IMPRESSIONS  1. Left ventricular ejection fraction, by estimation, is 40 to 45%. The left ventricle has mild to moderately decreased function. The left ventricle demonstrates global hypokinesis. There is mild concentric left ventricular hypertrophy. Left ventricular  diastolic parameters are consistent with Grade I diastolic dysfunction (impaired relaxation).  2. Right ventricular systolic function is low normal. The right ventricular size is normal. There is moderately elevated pulmonary artery systolic pressure.  3. Left atrial size was severely dilated.  4. Right atrial size was mildly dilated.  5. The mitral valve is degenerative. Moderate to severe mitral valve regurgitation. Moderate mitral annular calcification.  6. Tricuspid valve regurgitation is moderate.  7. The aortic valve is tricuspid. There is moderate calcification of the aortic valve. There is mild thickening of the aortic valve. Aortic valve regurgitation is not visualized. Mild to moderate aortic valve sclerosis/calcification is present, without any evidence of aortic stenosis.  8. There is mild (Grade II) atheroma plaque involving the aortic root and ascending aorta.  9. The inferior vena cava  is normal in size with greater than 50% respiratory variability, suggesting right atrial pressure of 3 mmHg. FINDINGS  Left Ventricle: Left ventricular ejection fraction, by estimation, is 40 to 45%. The left ventricle has mild to moderately decreased function. The left ventricle demonstrates global hypokinesis. The left ventricular internal cavity size was normal in size. There is mild concentric left ventricular hypertrophy. Left ventricular diastolic parameters are consistent with Grade I diastolic dysfunction (impaired relaxation). Right Ventricle: The right ventricular size is normal. No increase in right ventricular wall thickness. Right ventricular systolic function is low normal. There is moderately elevated pulmonary artery systolic pressure. The tricuspid regurgitant velocity  is 3.53 m/s, and with an assumed right atrial pressure of 3 mmHg, the estimated right ventricular systolic pressure is 40.9 mmHg. Left Atrium: Left atrial size was severely dilated. Right Atrium: Right atrial size was mildly dilated. Pericardium: There is no evidence of pericardial effusion. Mitral Valve: The mitral valve is degenerative in appearance. There is moderate thickening of the mitral valve leaflet(s). There is moderate calcification of the mitral valve leaflet(s). Moderate mitral annular calcification. Moderate to severe mitral valve regurgitation. Tricuspid Valve: The tricuspid valve is normal in structure. Tricuspid valve regurgitation is moderate. Aortic Valve: The aortic valve is tricuspid. There is moderate calcification of the aortic valve. There is mild thickening of the aortic valve. There is moderate aortic valve annular calcification. Aortic valve regurgitation is not visualized. Aortic regurgitation PHT measures 439 msec. Mild to moderate aortic valve sclerosis/calcification is present, without any evidence of aortic stenosis. Pulmonic Valve: The pulmonic valve was normal in structure. Pulmonic valve  regurgitation is mild. Aorta: The aortic root is  normal in size and structure. There is mild (Grade II) atheroma plaque involving the aortic root and ascending aorta. Venous: The inferior vena cava is normal in size with greater than 50% respiratory variability, suggesting right atrial pressure of 3 mmHg. IAS/Shunts: No atrial level shunt detected by color flow Doppler.  LEFT VENTRICLE PLAX 2D LVIDd:         4.40 cm  Diastology LVIDs:         3.40 cm  LV e' medial:    3.26 cm/s LV PW:         1.30 cm  LV E/e' medial:  29.7 LV IVS:        1.10 cm  LV e' lateral:   3.92 cm/s LVOT diam:     1.90 cm  LV E/e' lateral: 24.7 LV SV:         30 LV SV Index:   20 LVOT Area:     2.84 cm  RIGHT VENTRICLE            IVC RV Basal diam:  2.50 cm    IVC diam: 1.80 cm RV S prime:     8.81 cm/s TAPSE (M-mode): 2.4 cm LEFT ATRIUM             Index       RIGHT ATRIUM           Index LA diam:        4.70 cm 3.21 cm/m  RA Area:     11.90 cm LA Vol (A2C):   84.6 ml 57.75 ml/m RA Volume:   23.70 ml  16.18 ml/m LA Vol (A4C):   47.0 ml 32.08 ml/m LA Biplane Vol: 63.5 ml 43.35 ml/m  AORTIC VALVE LVOT Vmax:   55.80 cm/s LVOT Vmean:  38.800 cm/s LVOT VTI:    0.105 m AI PHT:      439 msec  AORTA Ao Root diam: 2.60 cm Ao Asc diam:  3.30 cm MITRAL VALVE                TRICUSPID VALVE MV Area (PHT): 3.85 cm     TR Peak grad:   49.8 mmHg MV Decel Time: 197 msec     TR Vmax:        353.00 cm/s MV E velocity: 96.70 cm/s MV A velocity: 104.00 cm/s  SHUNTS MV E/A ratio:  0.93         Systemic VTI:  0.10 m                             Systemic Diam: 1.90 cm [name] MD Electronically signed by [name] MD Signature Date/Time: 03/22/2021/4:54:32 PM    Final        Subjective:   Discharge Exam: Vitals:   03/24/21 0501 03/24/21 0845  BP: (!) 166/72   Pulse: 67   Resp: 16   Temp: 98.1 F (36.7 C)   SpO2: 95% 95%   Vitals:   03/23/21 1615 03/23/21 2014 03/24/21 0501 03/24/21 0845  BP: (!) 162/80 (!) 154/64 (!) 166/72    Pulse: 66 69 67   Resp: 20 19 16    Temp: 98.6 F (37 C) 97.6 F (36.4 C) 98.1 F (36.7 C)   TempSrc:  Oral Oral   SpO2: 95% 98% 95% 95%  Weight:   55.1 kg   Height:        General: Pt is alert, awake, not in  acute distress Cardiovascular: RRR, S1/S2 +, no rubs, no gallops Respiratory: CTA bilaterally, no wheezing, no rhonchi Abdominal: Soft, NT, ND, bowel sounds + Extremities: no edema, no cyanosis    The results of significant diagnostics from this hospitalization (including imaging, microbiology, ancillary and laboratory) are listed below for reference.     Microbiology: Recent Results (from the past 240 hour(s))  SARS CORONAVIRUS 2 (TAT 6-24 HRS) Nasopharyngeal Nasopharyngeal Swab     Status: None   Collection Time: 03/22/21 12:46 PM   Specimen: Nasopharyngeal Swab  Result Value Ref Range Status   SARS Coronavirus 2 NEGATIVE NEGATIVE Final    Comment: (NOTE) SARS-CoV-2 target nucleic acids are NOT DETECTED.  The SARS-CoV-2 RNA is generally detectable in upper and lower respiratory specimens during the acute phase of infection. Negative results do not preclude SARS-CoV-2 infection, do not rule out co-infections with other pathogens, and should not be used as the sole basis for treatment or other patient management decisions. Negative results must be combined with clinical observations, patient history, and epidemiological information. The expected result is Negative.  Fact Sheet for Patients: SugarRoll.be  Fact Sheet for Healthcare Providers: https://www.woods-mathews.com/  This test is not yet approved or cleared by the [place] FDA and  has been authorized for detection and/or diagnosis of SARS-CoV-2 by FDA under an Emergency Use Authorization (EUA). This EUA will remain  in effect (meaning this test can be used) for the duration of the COVID-19 declaration under Se ction 564(b)(1) of the Act, 21 U.S.C. section  360bbb-3(b)(1), unless the authorization is terminated or revoked sooner.  Performed at [facility], [place] [address], [place], [place] [address]      Labs: BNP (last 3 results) Recent Labs    03/22/21 0957  BNP >7,902.4*   Basic Metabolic Panel: Recent Labs  Lab 03/22/21 0957 03/23/21 0825 03/24/21 0433  NA 140 139 139  K 4.8 4.7 4.5  CL 107 108 105  CO2 21* 21* 22  GLUCOSE 101* 89 87  BUN 77* 71* 79*  CREATININE 4.18* 3.86* 4.44*  CALCIUM 9.3 9.1 9.0   Liver Function Tests: Recent Labs  Lab 03/23/21 0825 03/24/21 0433  AST 19 19  ALT 15 15  ALKPHOS 77 73  BILITOT 1.0 1.0  PROT 6.3* 6.4*  ALBUMIN 3.2* 3.3*   No results for input(s): LIPASE, AMYLASE in the last 168 hours. No results for input(s): AMMONIA in the last 168 hours. CBC: Recent Labs  Lab 03/21/21 1122 03/22/21 1123 03/23/21 0825  WBC  --  5.6 5.8  NEUTROABS  --   --  4.5  HGB 8.7* 9.2* 8.7*  HCT  --  28.8* 27.4*  MCV  --  98.3 96.8  PLT  --  235 219   Cardiac Enzymes: No results for input(s): CKTOTAL, CKMB, CKMBINDEX, TROPONINI in the last 168 hours. BNP: Invalid input(s): POCBNP CBG: No results for input(s): GLUCAP in the last 168 hours. D-Dimer Recent Labs    03/22/21 1304  DDIMER 2.05*   Hgb A1c No results for input(s): HGBA1C in the last 72 hours. Lipid Profile No results for input(s): CHOL, HDL, LDLCALC, TRIG, CHOLHDL, LDLDIRECT in the last 72 hours. Thyroid function studies No results for input(s): TSH, T4TOTAL, T3FREE, THYROIDAB in the last 72 hours.  Invalid input(s): FREET3 Anemia work up No results for input(s): VITAMINB12, FOLATE, FERRITIN, TIBC, IRON, RETICCTPCT in the last 72 hours. Urinalysis    Component Value Date/Time   COLORURINE YELLOW 05/07/2020 2138  APPEARANCEUR CLEAR 05/07/2020 2138   LABSPEC 1.012 05/07/2020 2138   PHURINE 5.0 05/07/2020 2138   GLUCOSEU NEGATIVE 05/07/2020 2138   HGBUR NEGATIVE 05/07/2020 2138   BILIRUBINUR negative  09/21/2020 1201   KETONESUR negative 09/21/2020 1201   KETONESUR NEGATIVE 05/07/2020 2138   PROTEINUR >=300 (A) 09/21/2020 1201   PROTEINUR 100 (A) 05/07/2020 2138   UROBILINOGEN 0.2 09/21/2020 1201   NITRITE Negative 09/21/2020 1201   NITRITE NEGATIVE 05/07/2020 2138   LEUKOCYTESUR Small (1+) (A) 09/21/2020 1201   LEUKOCYTESUR NEGATIVE 05/07/2020 2138   Sepsis Labs Invalid input(s): PROCALCITONIN,  WBC,  LACTICIDVEN Microbiology Recent Results (from the past 240 hour(s))  SARS CORONAVIRUS 2 (TAT 6-24 HRS) Nasopharyngeal Nasopharyngeal Swab     Status: None   Collection Time: 03/22/21 12:46 PM   Specimen: Nasopharyngeal Swab  Result Value Ref Range Status   SARS Coronavirus 2 NEGATIVE NEGATIVE Final    Comment: (NOTE) SARS-CoV-2 target nucleic acids are NOT DETECTED.  The SARS-CoV-2 RNA is generally detectable in upper and lower respiratory specimens during the acute phase of infection. Negative results do not preclude SARS-CoV-2 infection, do not rule out co-infections with other pathogens, and should not be used as the sole basis for treatment or other patient management decisions. Negative results must be combined with clinical observations, patient history, and epidemiological information. The expected result is Negative.  Fact Sheet for Patients: SugarRoll.be  Fact Sheet for Healthcare Providers: https://www.woods-mathews.com/  This test is not yet approved or cleared by the [place] FDA and  has been authorized for detection and/or diagnosis of SARS-CoV-2 by FDA under an Emergency Use Authorization (EUA). This EUA will remain  in effect (meaning this test can be used) for the duration of the COVID-19 declaration under Se ction 564(b)(1) of the Act, 21 U.S.C. section 360bbb-3(b)(1), unless the authorization is terminated or revoked sooner.  Performed at [facility], [place] [address], [place], [place] [address]       Time coordinating discharge: Over 30 minutes  SIGNED:   Nicolette Bang, MD  [facility] 03/24/2021, 10:13 AM Pager   If 7PM-7AM, please contact night-coverage [email or web address] Password [email or web address]

## 2021-03-24 NOTE — Plan of Care (Signed)
?  Problem: Education: ?Goal: Ability to demonstrate management of disease process will improve ?Outcome: Progressing ?  ?Problem: Activity: ?Goal: Capacity to carry out activities will improve ?Outcome: Progressing ?  ?Problem: Cardiac: ?Goal: Ability to achieve and maintain adequate cardiopulmonary perfusion will improve ?Outcome: Progressing ?  ?

## 2021-03-24 NOTE — Plan of Care (Signed)
  Problem: Education: Goal: Ability to demonstrate management of disease process will improve Outcome: Progressing   Problem: Cardiac: Goal: Ability to achieve and maintain adequate cardiopulmonary perfusion will improve Outcome: Progressing   Problem: Activity: Goal: Capacity to carry out activities will improve Outcome: Progressing

## 2021-03-24 NOTE — Discharge Instructions (Signed)
Heart Failure, Diagnosis  Heart failure means that your heart is not able to pump blood in the right way. This makes it hard for your body to work well. Heart failure is usually a long-term (chronic) condition. You must take good care of yourself and follow your treatment plan from your doctor. What are the causes?  High blood pressure.  Buildup of cholesterol and fat in the arteries.  Heart attack. This injures the heart muscle.  Heart valves that do not open and close properly.  Damage of the heart muscle. This is also called cardiomyopathy.  Infection of the heart muscle. This is also called myocarditis.  Lung disease. What increases the risk?  Getting older. The risk of heart failure goes up as a person ages.  Being overweight.  Being female.  Use tobacco or nicotine products.  Abusing alcohol or drugs.  Having taken medicines that can damage the heart.  Having any of these conditions: ? Diabetes. ? Abnormal heart rhythms. ? Thyroid problems. ? Low blood counts (anemia).  Having a family history of heart failure. What are the signs or symptoms?  Shortness of breath.  Coughing.  Swelling of the feet, ankles, legs, or belly.  Losing or gaining weight for no reason.  Trouble breathing.  Waking from sleep because of the need to sit up and get more air.  Fast heartbeat.  Being very tired.  Feeling dizzy, or feeling like you may pass out (faint).  Having no desire to eat.  Feeling like you may vomit (nauseous).  Peeing (urinating) more at night.  Feeling confused. How is this treated? This condition may be treated with:  Medicines. These can be given to treat blood pressure and to make the heart muscles stronger.  Changes in your daily life. These may include: ? Eating a healthy diet. ? Staying at a healthy body weight. ? Quitting tobacco, alcohol, and drug use. ? Doing exercises. ? Participating in a cardiac rehabilitation program. This program  helps you improve your health through exercise, education, and counseling.  Surgery. Surgery can be done to open blocked valves, or to put devices in the heart, such as pacemakers.  A donor heart (heart transplant). You will receive a healthy heart from a donor. Follow these instructions at home:  Treat other conditions as told by your doctor. These may include high blood pressure, diabetes, thyroid disease, or abnormal heart rhythms.  Learn as much as you can about heart failure.  Get support as you need it.  Keep all follow-up visits. Summary  Heart failure means that your heart is not able to pump blood in the right way.  This condition is often caused by high blood pressure, heart attack, or damage of the heart muscle.  Symptoms of this condition include shortness of breath and swelling of the feet, ankles, legs, or belly. You may also feel very tired or feel like you may vomit.  You may be treated with medicines, surgery, or changes in your daily life.  Treat other health conditions as told by your doctor. This information is not intended to replace advice given to you by your health care provider. Make sure you discuss any questions you have with your health care provider. Document Revised: 06/15/2020 Document Reviewed: 06/15/2020 Elsevier Patient Education  2021 [place].   Heart Failure, Self-Care Heart failure is a serious condition. The following information explains things you need to do to take care of yourself at home. To help you stay as healthy as  possible, you may be asked to change your diet, take certain medicines, and make other changes in your life. Your doctor may also give you more specific instructions. If you have problems or questions, call your doctor. What are the risks? Having heart failure makes it more likely for you to have some problems. These problems can get worse if you do not take good care of yourself. Problems may include:  Damage to the  kidneys, liver, or lungs.  Malnutrition.  Abnormal heart rhythms.  Blood clotting problems that could cause a stroke. Supplies needed:  Scale for weighing yourself.  Blood pressure monitor.  Notebook.  Medicines. How to care for yourself when you have heart failure Medicines Take over-the-counter and prescription medicines only as told by your doctor. Take your medicines every day.  Do not stop taking your medicine unless your doctor tells you to do so.  Do not skip any medicines.  Get your prescriptions refilled before you run out of medicine. This is important.  Talk with your doctor if you cannot afford your medicines. Eating and drinking  Eat heart-healthy foods. Talk with a diet specialist (dietitian) to create an eating plan.  Limit salt (sodium) if told by your doctor. Ask your diet specialist to tell you which seasonings are healthy for your heart.  Cook in healthy ways instead of frying. Healthy ways of cooking include roasting, grilling, broiling, baking, poaching, steaming, and stir-frying.  Choose foods that: ? Have no trans fat. ? Are low in saturated fat and cholesterol.  Choose healthy foods, such as: ? Fresh or frozen fruits and vegetables. ? Fish. ? Low-fat (lean) meats. ? Legumes, such as beans, peas, and lentils. ? Fat-free or low-fat dairy products. ? Whole-grain foods. ? High-fiber foods.  Limit how much fluid you drink, if told by your doctor.   Alcohol use  Do not drink alcohol if: ? Your doctor tells you not to drink. ? Your heart was damaged by alcohol, or you have very bad heart failure. ? You are pregnant, may be pregnant, or are planning to become pregnant.  If you drink alcohol: ? Limit how much you have to:  0-1 drink a day for women.  0-2 drinks a day for men. ? Know how much alcohol is in your drink. In the [place] one drink equals one 12 oz bottle of beer (355 mL), one 5 oz glass of wine (148 mL), or one 1 oz glass of hard  liquor (44 mL). Lifestyle  Do not smoke or use any products that contain nicotine or tobacco. If you need help quitting, ask your doctor. ? Do not use nicotine gum or patches before talking to your doctor.  Do not use illegal drugs.  Lose weight if told by your doctor.  Do physical activity if told by your doctor. Talk to your doctor before you begin an exercise if: ? You are an older adult. ? You have very bad heart failure.  Learn to manage stress. If you need help, ask your doctor.  Get physical rehab (rehabilitation) to help you stay independent and to help with your quality of life.  Participate in a cardiac rehab program. This program helps you improve your health through exercise, education, and counseling.  Plan time to rest when you get tired.   Check weight and blood pressure  Weigh yourself every day. This will help you to know if fluid is building up in your body. ? Weigh yourself every morning after you pee (urinate)  and before you eat breakfast. ? Wear the same amount of clothing each time. ? Write down your daily weight. Give your record to your doctor.  Check and write down your blood pressure as told by your doctor.  Check your pulse as told by your doctor.   Dealing with very hot and very cold weather  If it is very hot: ? Avoid activities that take a lot of energy. ? Use air conditioning or fans, or find a cooler place. ? Avoid caffeine and alcohol. ? Wear clothing that is loose-fitting, lightweight, and light-colored.  If it is very cold: ? Avoid activities that take a lot of energy. ? Layer your clothes. ? Wear mittens or gloves, a hat, and a face covering when you go outside. ? Avoid alcohol. Follow these instructions at home:  Stay up to date with shots (vaccines). Get pneumococcal and flu (influenza) shots.  Keep all follow-up visits. Contact a doctor if:  You gain 2-3 lb (1-1.4 kg) in 24 hours or 5 lb (2.3 kg) in a week.  You have  increasing shortness of breath.  You cannot do your normal activities.  You get tired easily.  You cough a lot.  You do not feel like eating or feel like you may vomit (nauseous).  You have swelling in your hands, feet, ankles, or belly (abdomen).  You cannot sleep well because it is hard to breathe.  You feel like your heart is beating fast (palpitations).  You get dizzy when you stand up.  You feel depressed or sad. Get help right away if:  You have trouble breathing.  You or someone else notices a change in your behavior, such as having trouble staying awake.  You have chest pain or discomfort.  You pass out (faint). These symptoms may be an emergency. Get help right away. Call your local emergency services (911 in the [place]  Do not wait to see if the symptoms will go away.  Do not drive yourself to the hospital. Summary  Heart failure is a serious condition. To care for yourself, you may have to change your diet, take medicines, and make other lifestyle changes.  Take your medicines every day. Do not stop taking them unless your doctor tells you to do so.  Limit salt and eat heart-healthy foods.  Ask your doctor if you can drink alcohol. You may have to stop alcohol use if you have very bad heart failure.  Contact your doctor if you gain weight quickly or feel that your heart is beating too fast. Get help right away if you pass out or have chest pain or trouble breathing. This information is not intended to replace advice given to you by your health care provider. Make sure you discuss any questions you have with your health care provider. Document Revised: 06/15/2020 Document Reviewed: 06/15/2020 Elsevier Patient Education  [place].   Heart Failure Action Plan A heart failure action plan helps you understand what to do when you have symptoms of heart failure. Your action plan is a color-coded plan that lists the symptoms to watch for and indicates  what actions to take.  If you have symptoms in the red zone, you need medical care right away.  If you have symptoms in the yellow zone, you are having problems.  If you have symptoms in the green zone, you are doing well. Follow the plan that was created by you and your health care provider. Review your plan each time you  visit your health care provider. Red zone These signs and symptoms mean you should get medical help right away:  You have trouble breathing when resting.  You have a dry cough that is getting worse.  You have swelling or pain in your legs or abdomen that is getting worse.  You suddenly gain more than 2-3 lb (0.9-1.4 kg) in 24 hours, or more than 5 lb (2.3 kg) in a week. This amount may be more or less depending on your condition.  You have trouble staying awake or you feel confused.  You have chest pain.  You do not have an appetite.  You pass out.  You have worsening sadness or depression. If you have any of these symptoms, call your local emergency services (911 in the [place] right away. Do not drive yourself to the hospital.   Yellow zone These signs and symptoms mean your condition may be getting worse and you should make some changes:  You have trouble breathing when you are active, or you need to sleep with your head raised on extra pillows to help you breathe.  You have swelling in your legs or abdomen.  You gain 2-3 lb (0.9-1.4 kg) in 24 hours, or 5 lb (2.3 kg) in a week. This amount may be more or less depending on your condition.  You get tired easily.  You have trouble sleeping.  You have a dry cough. If you have any of these symptoms:  Contact your health care provider within the next day.  Your health care provider may adjust your medicines.   Green zone These signs mean you are doing well and can continue what you are doing:  You do not have shortness of breath.  You have very little swelling or no new swelling.  Your weight is  stable (no gain or loss).  You have a normal activity level.  You do not have chest pain or any other new symptoms.   Follow these instructions at home:  Take over-the-counter and prescription medicines only as told by your health care provider.  Weigh yourself daily. Your target weight is __________ lb (__________ kg). ? Call your health care provider if you gain more than __________ lb (__________ kg) in 24 hours, or more than __________ lb (__________ kg) in a week. ? Health care provider name: _____________________________________________________ ? Health care provider phone number: _____________________________________________________  Eat a heart-healthy diet. Work with a diet and nutrition specialist (dietitian) to create an eating plan that is best for you.  Keep all follow-up visits. This is important. Where to find more information  American Heart Association: www.heart.org Summary  A heart failure action plan helps you understand what to do when you have symptoms of heart failure.  Follow the action plan that was created by you and your health care provider.  Get help right away if you have any symptoms in the red zone. This information is not intended to replace advice given to you by your health care provider. Make sure you discuss any questions you have with your health care provider. Document Revised: 07/08/2020 Document Reviewed: 07/08/2020 Elsevier Patient Education  [place].   Heart Failure Eating Plan Heart failure, also called congestive heart failure, occurs when your heart does not pump blood well enough to meet your body's needs for oxygen-rich blood. Heart failure is a long-term (chronic) condition. Living with heart failure can be challenging. Following your health care provider's instructions about a healthy lifestyle and working with a dietitian  to choose the right foods may help to improve your symptoms. An eating plan for someone with heart  failure will include changes that limit the intake of salt (sodium) and unhealthy fat. What are tips for following this plan? Reading food labels  Check food labels for the amount of sodium per serving. Choose foods that have less than 140 mg (milligrams) of sodium in each serving.  Check food labels for the number of calories per serving. This is important if you need to limit your daily calorie intake to lose weight.  Check food labels for the serving size. If you eat more than one serving, you will be eating more sodium and calories than what is listed on the label.  Look for foods that are labeled as "sodium-free," "very low sodium," or "low sodium." ? Foods labeled as "reduced sodium" or "lightly salted" may still have more sodium than what is recommended for you. Cooking  Avoid adding salt when cooking. Ask your health care provider or dietitian before using salt substitutes.  Season food with salt-free seasonings, spices, or herbs. Check the label of seasoning mixes to make sure they do not contain salt.  Cook with heart-healthy oils, such as olive, canola, soybean, or sunflower oil.  Do not fry foods. Cook foods using low-fat methods, such as baking, boiling, grilling, and broiling.  Limit unhealthy fats when cooking by: ? Removing the skin from poultry, such as chicken. ? Removing all visible fats from meats. ? Skimming the fat off from stews, soups, and gravies before serving them. Meal planning  Limit your intake of: ? Processed, canned, or prepackaged foods. ? Foods that are high in trans fat, such as fried foods. ? Sweets, desserts, sugary drinks, and other foods with added sugar. ? Full-fat dairy products, such as whole milk.  Eat a balanced diet. This may include: ? 4-5 servings of fruit each day and 4-5 servings of vegetables each day. At each meal, try to fill one-half of your plate with fruits and vegetables. ? Up to 6-8 servings of whole grains each day. ? Up  to 2 servings of lean meat, poultry, or fish each day. One serving of meat is equal to 3 oz (85 g). This is about the same size as a deck of cards. ? 2 servings of low-fat dairy each day. ? Heart-healthy fats. Healthy fats called omega-3 fatty acids are found in foods such as flaxseed and cold-water fish like sardines, salmon, and mackerel.  Aim to eat 25-35 g (grams) of fiber a day. Foods that are high in fiber include apples, broccoli, carrots, beans, peas, and whole grains.  Do not add salt or condiments that contain salt (such as soy sauce) to foods before eating.  When eating at a restaurant, ask that your food be prepared with less salt or no salt, if possible.  Try to eat 2 or more vegetarian meals each week.  Eat more home-cooked food and eat less restaurant, buffet, and fast food.   General information  Do not eat more than 2,300 mg of sodium a day. The amount of sodium that is recommended for you may be lower, depending on your condition.  Maintain a healthy body weight as directed. Ask your health care provider what a healthy weight is for you. ? Check your weight every day. ? Work with your health care provider and dietitian to make a plan that is right for you to lose weight or maintain your current weight.  Limit how much  fluid you drink. Ask your health care provider or dietitian how much fluid you can have each day.  Limit or avoid alcohol as told by your health care provider or dietitian. Recommended foods Fruits All fresh, frozen, and canned fruits. Dried fruits, such as raisins, prunes, and cranberries. Vegetables All fresh vegetables. Vegetables that are frozen without sauce or added salt. Low-sodium or sodium-free canned vegetables. Grains Bread with less than 80 mg of sodium per slice. Whole-wheat pasta, quinoa, and brown rice. Oats and oatmeal. Barley. [place]. Grits and cream of wheat. Whole-grain and whole-wheat cold cereal. Meats and other protein foods Lean  cuts of meat. Skinless chicken and [place]. Fish with high omega-3 fatty acids, such as salmon, sardines, and other cold-water fishes. Eggs. Dried beans, peas, and edamame. Unsalted nuts and nut butters. Dairy Low-fat or nonfat (skim) milk and dried milk. Rice milk, soy milk, and almond milk. Low-fat or nonfat yogurt. Small amounts of reduced-sodium block cheese. Low-sodium cottage cheese. Fats and oils Olive, canola, soybean, flaxseed, avocado, or sunflower oil. Sweets and desserts Applesauce. Granola bars. Sugar-free pudding and gelatin. Frozen fruit bars. Seasoning and other foods Fresh and dried herbs. Lemon or lime juice. Vinegar. Low-sodium ketchup. Salt-free marinades, salad dressings, sauces, and seasonings. The items listed above may not be a complete list of foods and beverages you can eat. Contact a dietitian for more information. Foods to avoid Fruits Fruits that are dried with sodium-containing preservatives. Vegetables Canned vegetables. Frozen vegetables with sauce or seasonings. Creamed vegetables. [place] fries. Onion rings. Pickled vegetables and sauerkraut. Grains Bread with more than 80 mg of sodium per slice. Hot or cold cereal with more than 140 mg sodium per serving. Salted pretzels and crackers. Prepackaged breadcrumbs. Bagels, croissants, and biscuits. Meats and other protein foods Ribs and chicken wings. Bacon, ham, pepperoni, bologna, salami, and packaged luncheon meats. Hot dogs, bratwurst, and sausage. Canned meat. Smoked meat and fish. Salted nuts and seeds. Dairy Whole milk, half-and-half, and cream. Buttermilk. Processed cheese, cheese spreads, and cheese curds. Regular cottage cheese. Feta cheese. Shredded cheese. String cheese. Fats and oils Butter, lard, shortening, ghee, and bacon fat. Canned and packaged gravies. Seasoning and other foods Onion salt, garlic salt, table salt, and sea salt. Marinades. Regular salad dressings. Relishes, pickles, and olives.  Meat flavorings and tenderizers, and bouillon cubes. Horseradish, ketchup, and mustard. Worcestershire sauce. Teriyaki sauce, soy sauce (including reduced sodium). Hot sauce and Tabasco sauce. Steak sauce, fish sauce, oyster sauce, and cocktail sauce. Taco seasonings. Barbecue sauce. Tartar sauce. The items listed above may not be a complete list of foods and beverages you should avoid. Contact a dietitian for more information. Summary  A heart failure eating plan includes changes that limit your intake of sodium and unhealthy fat, and it may help you lose weight or maintain a healthy weight. Your health care provider may also recommend limiting how much fluid you drink.  Most people with heart failure should eat no more than 2,300 mg of salt (sodium) a day. The amount of sodium that is recommended for you may be lower, depending on your condition.  Contact your health care provider or dietitian before making any major changes to your diet. This information is not intended to replace advice given to you by your health care provider. Make sure you discuss any questions you have with your health care provider. Document Revised: 07/08/2020 Document Reviewed: 07/08/2020 Elsevier Patient Education  2021 [place] With Heart Failure Heart failure is a  serious condition. It means that the heart cannot fill or pump enough blood and oxygen to support the body and its functions. Caregivers provide physical and emotional support to friends or family members with heart failure. If you are supporting someone who has heart failure, you play an important role in making sure that the person follows schedules, takes medicines, goes to appointments, and follows his or her overall treatment and rehabilitation plan. How does this condition affect the person I am caring for? The person with heart failure may have many changes to his or her daily routine. The person may need to:  Make diet  changes.  Make changes to his or her physical activity and exercise routines.  Take medicines on a regular basis.  Have some procedures or surgery.  Have rehabilitation treatment. What actions can I take to help someone manage this condition? Planning for discharge from the hospital Before your friend or family member leaves the hospital after treatment, make sure you understand:  The recovery process after heart failure and the physical, emotional, behavior, and other changes that occur in the person with heart failure.  The medicines that the person with heart failure has to take and when to take them.  Whether you need to get certain devices or equipment for the person with heart failure.  The treatments for heart failure.  How to lower the person's risk of having other heart problems.  Diet and exercise changes for the person with heart failure.  What symptoms to watch for and when emergency medical care is needed.  How to contact the person's health care provider when you have questions about the person's care. You may want to appoint one family member as the main contact. Providing general care and support  Help the person follow his or her treatment plan as directed by the health care provider. This could include taking notes at health care visits and helping the person keep track of information.  Learn as much as you can about heart failure.  Find out what extra help the person may need at home. In some cases, a person with heart failure will need help using the bathroom, bathing, eating, or doing other activities. Make sure you know what changes you have to make at home to make the person safe.  Ask the person how you can support him or her.  Remember that your support really matters. Social support is a huge benefit for someone who is coping with illness. How to care for yourself As a caregiver, it is important to take care of yourself so that you can better meet the  needs of the person with heart failure and also meet your own needs. Recognizing stress It is normal to have many different emotions while caring for someone with heart failure. The lifestyle changes that come with this diagnosis can be stressful and difficult, leading to emotional, physical, and mental exhaustion (caregiver burnout). Signs of this include:  Sleep problems.  Trouble concentrating.  Changes in appetite.  Depressed mood and mood swings.  Feeling unmotivated to do anything.  Feeling emotionally out of control.  Neglecting yourself or the person with heart failure, including forgetting to take or give medicines or keep appointments. Taking steps to care for yourself  Monitor yourself for caregiver burnout. This can happen if you feel overwhelmed.  Ask friends and family for help.  Get counseling or therapy.  Take time for yourself.  Relax, exercise, and eat healthy foods.  Maintain a sense  of self outside of being a caregiver.  Insist on getting respite care. Respite care can provide short-term care for the person so that you can have a regular break from the stress of caregiving. Where to find support  Ask the person's health care provider for help or referrals to resources.  Consider taking classes to learn more about supporting someone with heart failure. Take a class to become certified in CPR.  Join a support group for people who are caring for someone with heart failure.   Where to find more information  American Heart Association: www.heart.org Summary  Before your friend or family member leaves the hospital, make sure you understand how to care for someone with heart failure.  It is normal to have many different emotions while caring for someone with heart failure.  Take care of yourself by asking for help and taking time to relax.  Monitor yourself for caregiver burnout and know when to ask for help. This information is not intended to replace  advice given to you by your health care provider. Make sure you discuss any questions you have with your health care provider. Document Revised: 07/08/2020 Document Reviewed: 07/08/2020 Elsevier Patient Education  2021 [employer].

## 2021-03-24 NOTE — Progress Notes (Signed)
SATURATION QUALIFICATIONS: (This note is used to comply with regulatory documentation for home oxygen)  Patient Saturations on Room Air at Rest = 95%  Patient Saturations on Room Air while Ambulating = 93%  Patient Saturations on 0 Liters of oxygen while Ambulating = 93%  Please briefly explain why patient needs home oxygen: no need

## 2021-03-25 ENCOUNTER — Other Ambulatory Visit: Payer: Self-pay

## 2021-03-25 DIAGNOSIS — M810 Age-related osteoporosis without current pathological fracture: Secondary | ICD-10-CM | POA: Diagnosis not present

## 2021-03-25 DIAGNOSIS — N184 Chronic kidney disease, stage 4 (severe): Secondary | ICD-10-CM | POA: Diagnosis not present

## 2021-03-25 DIAGNOSIS — E7800 Pure hypercholesterolemia, unspecified: Secondary | ICD-10-CM | POA: Diagnosis not present

## 2021-03-25 DIAGNOSIS — I509 Heart failure, unspecified: Secondary | ICD-10-CM | POA: Diagnosis not present

## 2021-03-25 DIAGNOSIS — M1990 Unspecified osteoarthritis, unspecified site: Secondary | ICD-10-CM | POA: Diagnosis not present

## 2021-03-25 DIAGNOSIS — I130 Hypertensive heart and chronic kidney disease with heart failure and stage 1 through stage 4 chronic kidney disease, or unspecified chronic kidney disease: Secondary | ICD-10-CM | POA: Diagnosis not present

## 2021-03-25 DIAGNOSIS — I129 Hypertensive chronic kidney disease with stage 1 through stage 4 chronic kidney disease, or unspecified chronic kidney disease: Secondary | ICD-10-CM | POA: Diagnosis not present

## 2021-03-25 DIAGNOSIS — E039 Hypothyroidism, unspecified: Secondary | ICD-10-CM | POA: Diagnosis not present

## 2021-03-25 DIAGNOSIS — I5023 Acute on chronic systolic (congestive) heart failure: Secondary | ICD-10-CM

## 2021-03-26 ENCOUNTER — Ambulatory Visit
Admission: EM | Admit: 2021-03-26 | Discharge: 2021-03-26 | Disposition: A | Discharge status: Home / Self Care | Payer: Medicare HMO | Attending: Emergency Medicine | Admitting: Emergency Medicine

## 2021-03-26 ENCOUNTER — Other Ambulatory Visit: Payer: Self-pay | Admitting: *Deleted

## 2021-03-26 ENCOUNTER — Ambulatory Visit: Payer: Self-pay

## 2021-03-26 ENCOUNTER — Other Ambulatory Visit: Payer: Self-pay

## 2021-03-26 DIAGNOSIS — N390 Urinary tract infection, site not specified: Secondary | ICD-10-CM

## 2021-03-26 LAB — POCT URINALYSIS DIP (MANUAL ENTRY)
Bilirubin, UA: NEGATIVE
Glucose, UA: NEGATIVE mg/dL
Ketones, POC UA: NEGATIVE mg/dL
Nitrite, UA: NEGATIVE
Protein Ur, POC: >=300 mg/dL — AB
Spec Grav, UA: 1.025 (ref 1.010–1.025)
Urobilinogen, UA: 0.2 E.U./dL
pH, UA: 6.5 (ref 5.0–8.0)

## 2021-03-26 MED ORDER — CEPHALEXIN 500 MG PO CAPS: 500.0000 mg | ORAL_CAPSULE | Freq: Two times a day (BID) | ORAL | 0 refills | Status: AC

## 2021-03-26 NOTE — Patient Outreach (Signed)
[place] [facility]) Care Management  03/26/2021  Alicia Sellers [birth year] [id number]  Telephone outreach for [facility] follow up on CHF.  Talked with [name] briefly and she handed the phone to her daughter-in-law, Alicia Sellers. Introduced to [facility] services to which Alicia Sellers said they had everything under control. They had a transition of care phone call from Dr. Andrew Au office today and follow up appts have been made. She says they don't need any other services.  Will send [facility] information for their future reference and one of the [place] Managers will call back in 2 weeks to inquire whether they would like to take advantage of the services.  [name]. Myrtie Neither, MSN, [facility] Gerontological Nurse Practitioner [facility] Care Management [phone]

## 2021-03-26 NOTE — Discharge Instructions (Signed)
Begin Keflex twice daily for 1 week Urine culture pending-we will call if we need to change antibiotics Follow-up if any symptoms not improving or worsening

## 2021-03-26 NOTE — ED Provider Notes (Signed)
[name]    CSN: [id number] Arrival date & time: 03/26/21  0959      History   Chief Complaint Chief Complaint  Patient presents with  . UTI    HPI Alicia Sellers is a 87 y.o. female history of CKD, hypertension, systolic heart failure, presenting today for evaluation of dysuria.  Reports symptoms for approximately 2 days.  Reports history of prior UTIs, recently admitted to the hospital and reports having a pure wick that left moisture near pelvic area which may have contributed to UTI.  Denies any rash itching or vaginal discharge.  Denies fevers nausea or vomiting.  Last creatinine 4.4 with GFR of 9.  Not on dialysis HPI  Past Medical History:  Diagnosis Date  . Acute on chronic left systolic heart failure ([place]) 10/26/2019  . Anemia   . Anxiety   . Arthritis   . Chronic kidney disease   . Dyspnea   . Headache   . Hypertension   . Hypothyroidism     Patient Active Problem List   Diagnosis Date Noted  . Acute on chronic systolic CHF (congestive heart failure) ([place]) 03/22/2021  . Cholangitis 05/08/2020  . Sepsis due to Escherichia coli (E. coli) ([place]) 05/08/2020  . Transaminitis 05/08/2020  . Chronic systolic CHF (congestive heart failure) ([place]) 05/08/2020  . Sepsis ([place]) 05/08/2020  . Acute on chronic left systolic heart failure ([place]) 10/26/2019  . Otorrhea of left ear 05/23/2019  . Metabolic acidosis, normal anion gap (NAG) 12/02/2018  . Dyspnea 12/01/2018  . HTN (hypertension) 12/01/2018  . CKD (chronic kidney disease), stage IV ([place]) 12/01/2018  . Hypothyroidism 12/01/2018  . Anxiety 12/01/2018  . Anemia due to chronic kidney disease 12/01/2018  . Left chronic serous otitis media 03/31/2018  . Presbycusis of both ears 03/17/2018  . Arthritis 01/06/2012    Past Surgical History:  Procedure Laterality Date  . APPENDECTOMY    . BREAST BIOPSY Left 2015   benign  . EYE SURGERY    . TONSILLECTOMY      OB History   No obstetric history  on file.      Home Medications    Prior to Admission medications   Medication Sig Start Date End Date Taking? Authorizing Provider  cephALEXin (KEFLEX) 500 MG capsule Take 1 capsule (500 mg total) by mouth 2 (two) times daily for 7 days. 03/26/21 04/02/21 Yes Moriya Mitchell C, PA-C  acetaminophen (TYLENOL) 500 MG tablet Take 1,000 mg by mouth every 6 (six) hours as needed for mild pain.    [provider]  amLODipine (NORVASC) 10 MG tablet Take 5 mg by mouth 2 (two) times daily. 10/30/19   [provider]  aspirin 81 MG EC tablet Take 81 mg by mouth daily.    [provider]  citalopram (CELEXA) 10 MG tablet Take 10 mg by mouth daily.    [provider]  diclofenac Sodium (VOLTAREN) 1 % GEL Apply 2 g topically 4 (four) times daily. Patient taking differently: Apply 2 g topically 2 (two) times daily as needed (pain). 01/10/21   Mehkai Gallo C, PA-C  epoetin alfa (PROCRIT) 4000 UNIT/ML injection     [provider]  fexofenadine (ALLEGRA) 180 MG tablet Take 180 mg by mouth daily as needed for allergies.    [provider]  fluticasone (FLONASE) 50 MCG/ACT nasal spray Place 2 sprays into both nostrils daily as needed for allergies.    [provider]  furosemide (LASIX) 20 MG tablet  Take 20 mg by mouth daily. 05/11/20   [provider]  gemfibrozil (LOPID) 600 MG tablet Take 600 mg by mouth 2 (two) times daily before a meal.    [provider]  hydrALAZINE (APRESOLINE) 25 MG tablet Take 50 mg by mouth in the morning and at bedtime.     [provider]  levothyroxine (SYNTHROID) 50 MCG tablet Take 50 mcg by mouth daily before breakfast.    [provider]  LORazepam (ATIVAN) 0.5 MG tablet Take 0.25 mg by mouth at bedtime.     [provider]  meclizine (ANTIVERT) 25 MG tablet Take 25 mg by mouth 3 (three) times daily as needed for dizziness (vertigo). 02/09/11   [provider]   metoprolol succinate (TOPROL-XL) 25 MG 24 hr tablet Take 12.5 mg by mouth every evening.     [provider]  Omega-3 Fatty Acids (FISH OIL) 1200 MG CAPS Take 1,200 mg by mouth in the morning and at bedtime.    [provider]  ondansetron (ZOFRAN) 8 MG tablet Take 1 tablet (8 mg total) by mouth every 8 (eight) hours as needed for nausea or vomiting. 05/11/20   Rai, Ripudeep K, MD  ondansetron (ZOFRAN-ODT) 4 MG disintegrating tablet Take 4 mg by mouth every 8 (eight) hours as needed for nausea.    [provider]  polyethylene glycol (MIRALAX / GLYCOLAX) 17 g packet Take 17 g by mouth daily as needed for mild constipation or moderate constipation. Patient not taking: Reported on 03/22/2021 05/11/20   Rai, Vernelle Emerald, MD  Red Yeast Rice Extract 600 MG CAPS Take 600 mg by mouth 2 (two) times daily.    [provider]  sodium bicarbonate 650 MG tablet Take 650 mg by mouth in the morning, at noon, and at bedtime.    [provider]  traMADol (ULTRAM) 50 MG tablet Take 50 mg by mouth every 12 (twelve) hours as needed for moderate pain. 10/18/13   [provider]  ursodiol (ACTIGALL) 300 MG capsule Take 300 mg by mouth 2 (two) times daily. 03/07/21   [provider]  vitamin E 180 MG (400 UNITS) capsule Take 400 Units by mouth daily.    [provider]  Wheat Dextrin (BENEFIBER DRINK MIX PO) Take 1 Scoop by mouth daily as needed (constipation).    [provider]    Family History Family History  Problem Relation Age of Onset  . Breast cancer Neg Hx     Social History Social History   Tobacco Use  . Smoking status: Never Smoker  . Smokeless tobacco: Never Used  Vaping Use  . Vaping Use: Never used  Substance Use Topics  . Alcohol use: Never  . Drug use: Never     Allergies   Latex, Ace inhibitors, Atorvastatin, Chlorthalidone, Ciprofloxacin, Fosamax [alendronate], Pravachol [pravastatin], and  Penicillins   Review of Systems Review of Systems  Constitutional: Negative for fever.  Respiratory: Negative for shortness of breath.   Cardiovascular: Negative for chest pain.  Gastrointestinal: Negative for abdominal pain, diarrhea, nausea and vomiting.  Genitourinary: Positive for dysuria. Negative for flank pain, genital sores, hematuria, menstrual problem, vaginal bleeding, vaginal discharge and vaginal pain.  Musculoskeletal: Negative for back pain.  Skin: Negative for rash.  Neurological: Negative for dizziness, light-headedness and headaches.     Physical Exam Triage Vital Signs ED Triage Vitals  Enc Vitals Group     BP 03/26/21 1016 (!) 160/73     Pulse Rate  03/26/21 1016 62     Resp 03/26/21 1016 (!) 166     Temp 03/26/21 1016 97.8 F (36.6 C)     Temp Source 03/26/21 1016 Oral     SpO2 03/26/21 1016 92 %     Weight --      Height --      Head Circumference --      Peak Flow --      Pain Score 03/26/21 1015 5     Pain Loc --      Pain Edu? --      Excl. in [place]? --    No data found.  Updated Vital Signs BP (!) 160/73 (BP Location: Left Arm)   Pulse 62   Temp 97.8 F (36.6 C) (Oral)   Resp 16   SpO2 92%   Visual Acuity Right Eye Distance:   Left Eye Distance:   Bilateral Distance:    Right Eye Near:   Left Eye Near:    Bilateral Near:     Physical Exam Vitals and nursing note reviewed.  Constitutional:      Appearance: She is well-developed.     Comments: No acute distress  HENT:     Head: Normocephalic and atraumatic.     Nose: Nose normal.  Eyes:     Conjunctiva/sclera: Conjunctivae normal.  Cardiovascular:     Rate and Rhythm: Normal rate.  Pulmonary:     Effort: Pulmonary effort is normal. No respiratory distress.  Abdominal:     General: There is no distension.  Musculoskeletal:        General: Normal range of motion.     Cervical back: Neck supple.  Skin:    General: Skin is warm and dry.  Neurological:     Mental Status: She  is alert and oriented to person, place, and time.      UC Treatments / Results  Labs (all labs ordered are listed, but only abnormal results are displayed) Labs Reviewed  POCT URINALYSIS DIP (MANUAL ENTRY) - Abnormal; Notable for the following components:      Result Value   Clarity, UA cloudy (*)    Blood, UA trace-intact (*)    Protein Ur, POC >=300 (*)    Leukocytes, UA Large (3+) (*)    All other components within normal limits  URINE CULTURE    EKG   Radiology No results found.  Procedures Procedures (including critical care time)  Medications Ordered in UC Medications - No data to display  Initial Impression / Assessment and Plan / UC Course  I have reviewed the triage vital signs and the nursing notes.  Pertinent labs & imaging results that were available during my care of the patient were reviewed by me and considered in my medical decision making (see chart for details).     UA consistent with UTI, urine culture pending.  Placing on Keflex twice daily x7 days, follow-up if not improving or worsening.  Discussed strict return precautions. Patient verbalized understanding and is agreeable with plan.  Final Clinical Impressions(s) / UC Diagnoses   Final diagnoses:  Lower urinary tract infection, acute     Discharge Instructions     Begin Keflex twice daily for 1 week Urine culture pending-we will call if we need to change antibiotics Follow-up if any symptoms not improving or worsening    ED Prescriptions    Medication Sig Dispense Auth. Provider   cephALEXin (KEFLEX) 500 MG capsule Take 1 capsule (500 mg total) by  mouth 2 (two) times daily for 7 days. 14 capsule Jariana Shumard, [place] C, PA-C     PDMP not reviewed this encounter.   Janith Lima, PA-C 03/26/21 1101

## 2021-03-26 NOTE — ED Triage Notes (Signed)
Pt presents with burning during urination and nausea X 2 days.

## 2021-03-28 ENCOUNTER — Ambulatory Visit (HOSPITAL_COMMUNITY)
Admission: RE | Admit: 2021-03-28 | Discharge: 2021-03-28 | Disposition: A | Discharge status: Home / Self Care | Payer: Medicare HMO | Source: Ambulatory Visit | Attending: Nephrology | Admitting: Nephrology

## 2021-03-28 ENCOUNTER — Other Ambulatory Visit: Payer: Self-pay

## 2021-03-28 VITALS — BP 148/58 | HR 58 | Temp 97.8°F | Resp 20

## 2021-03-28 DIAGNOSIS — D631 Anemia in chronic kidney disease: Secondary | ICD-10-CM | POA: Diagnosis present

## 2021-03-28 DIAGNOSIS — N184 Chronic kidney disease, stage 4 (severe): Secondary | ICD-10-CM | POA: Insufficient documentation

## 2021-03-28 LAB — IRON AND TIBC
Iron: 73 ug/dL (ref 28–170)
Saturation Ratios: 24 % (ref 10.4–31.8)
TIBC: 301 ug/dL (ref 250–450)
UIBC: 228 ug/dL

## 2021-03-28 LAB — POCT HEMOGLOBIN-HEMACUE: Hemoglobin: 9.8 g/dL — ABNORMAL LOW (ref 12.0–15.0)

## 2021-03-28 LAB — FERRITIN: Ferritin: 231 ng/mL (ref 11–307)

## 2021-03-28 MED ORDER — EPOETIN ALFA 20000 UNIT/ML IJ SOLN
20000.0000 [IU] | INTRAMUSCULAR | Status: DC
  Administered 2021-03-28: 20000 [IU] via SUBCUTANEOUS

## 2021-03-28 MED ORDER — EPOETIN ALFA 20000 UNIT/ML IJ SOLN
INTRAMUSCULAR | Status: AC
  Filled 2021-03-28: qty 1

## 2021-03-29 LAB — URINE CULTURE: Culture: >=100000 — AB

## 2021-03-31 ENCOUNTER — Telehealth (HOSPITAL_COMMUNITY): Payer: Self-pay | Admitting: Emergency Medicine

## 2021-03-31 MED ORDER — ONDANSETRON 4 MG PO TBDP: 4.0000 mg | ORAL_TABLET | Freq: Three times a day (TID) | ORAL | 0 refills | Status: AC | PRN

## 2021-03-31 MED ORDER — CIPROFLOXACIN HCL 500 MG PO TABS: 500.0000 mg | ORAL_TABLET | Freq: Two times a day (BID) | ORAL | 0 refills | Status: AC

## 2021-03-31 NOTE — Telephone Encounter (Signed)
Medications to change due to urine culture result, per [facility], APP

## 2021-04-01 DIAGNOSIS — R0602 Shortness of breath: Secondary | ICD-10-CM | POA: Diagnosis not present

## 2021-04-01 DIAGNOSIS — I5022 Chronic systolic (congestive) heart failure: Secondary | ICD-10-CM | POA: Diagnosis not present

## 2021-04-01 DIAGNOSIS — N184 Chronic kidney disease, stage 4 (severe): Secondary | ICD-10-CM | POA: Diagnosis not present

## 2021-04-01 DIAGNOSIS — E034 Atrophy of thyroid (acquired): Secondary | ICD-10-CM | POA: Diagnosis not present

## 2021-04-02 DIAGNOSIS — K828 Other specified diseases of gallbladder: Secondary | ICD-10-CM | POA: Diagnosis not present

## 2021-04-02 DIAGNOSIS — K5901 Slow transit constipation: Secondary | ICD-10-CM | POA: Diagnosis not present

## 2021-04-03 DIAGNOSIS — I129 Hypertensive chronic kidney disease with stage 1 through stage 4 chronic kidney disease, or unspecified chronic kidney disease: Secondary | ICD-10-CM | POA: Diagnosis not present

## 2021-04-03 DIAGNOSIS — D649 Anemia, unspecified: Secondary | ICD-10-CM | POA: Diagnosis not present

## 2021-04-03 DIAGNOSIS — N3000 Acute cystitis without hematuria: Secondary | ICD-10-CM | POA: Diagnosis not present

## 2021-04-03 DIAGNOSIS — I130 Hypertensive heart and chronic kidney disease with heart failure and stage 1 through stage 4 chronic kidney disease, or unspecified chronic kidney disease: Secondary | ICD-10-CM | POA: Diagnosis not present

## 2021-04-03 DIAGNOSIS — I509 Heart failure, unspecified: Secondary | ICD-10-CM | POA: Diagnosis not present

## 2021-04-03 DIAGNOSIS — N184 Chronic kidney disease, stage 4 (severe): Secondary | ICD-10-CM | POA: Diagnosis not present

## 2021-04-04 ENCOUNTER — Other Ambulatory Visit: Payer: Self-pay

## 2021-04-04 ENCOUNTER — Encounter (HOSPITAL_COMMUNITY)
Admission: RE | Admit: 2021-04-04 | Discharge: 2021-04-04 | Disposition: A | Discharge status: Home / Self Care | Payer: Medicare HMO | Source: Ambulatory Visit | Attending: Nephrology | Admitting: Nephrology

## 2021-04-04 VITALS — BP 153/45 | HR 56 | Temp 97.3°F | Resp 20

## 2021-04-04 DIAGNOSIS — N184 Chronic kidney disease, stage 4 (severe): Secondary | ICD-10-CM

## 2021-04-04 DIAGNOSIS — D631 Anemia in chronic kidney disease: Secondary | ICD-10-CM

## 2021-04-04 LAB — POCT HEMOGLOBIN-HEMACUE: Hemoglobin: 9.5 g/dL — ABNORMAL LOW (ref 12.0–15.0)

## 2021-04-04 MED ORDER — EPOETIN ALFA 20000 UNIT/ML IJ SOLN
20000.0000 [IU] | INTRAMUSCULAR | Status: DC
  Administered 2021-04-04: 20000 [IU] via SUBCUTANEOUS

## 2021-04-04 MED ORDER — EPOETIN ALFA 20000 UNIT/ML IJ SOLN
INTRAMUSCULAR | Status: AC
  Filled 2021-04-04: qty 1

## 2021-04-09 ENCOUNTER — Other Ambulatory Visit: Payer: Self-pay | Admitting: *Deleted

## 2021-04-09 ENCOUNTER — Other Ambulatory Visit: Payer: Self-pay

## 2021-04-09 NOTE — Patient Outreach (Signed)
[facility]) Care Management  04/09/2021  Alicia Sellers 14-Nov-1929 [id number]   THN follow up for post hospital/complex care patient  Alicia Sellers was referred to [facility] on 03/25/21 by [facility] hospital liaison for post hospital stay/complex care services    Insurance A[employer]na Medicare   Last admission: 03/22/21-03/24/21  Acute on chronic systolic heart failure, Dyspnea, Chronic Kidney disease (CKD) stage 5   Patient is able to verify HIPAA ([name] and [name]) identifiers Reviewed and addressed the purpose of the follow up call with the patient  Consent: [facility] RN CM reviewed [facility] services with patient. Patient gave verbal consent for services.  Follow up assessment Alicia Sellers reports she is improving   congestive Heart Failure (CHF) weight (wt)122.1  Followed by Dr Doylene Canard 1500 fluid restriction being followed   Hypertension (HTN) blood pressure (BP) check every morning This morning BP was 146/72 Only walk in her home but agrees to do more to help her CHF and HTN   [facility] welcome letter   Plan Patient agrees to care plan and follow up within the next 30 business days Pt encouraged to return a call to [facility] RN CM prn Goals Addressed              This Visit's Progress     Patient Stated   .  (THN) Lifestyle Change-Hypertension (pt-stated)   On track     Timeframe:  Short-Term Goal Priority:  Medium Start Date:           04/09/21                  Expected End Date:          06/05/21             Follow Up Date 05/14/21   - ask questions to understand - learn about high blood pressure   Notes:  04/09/21 check BP at home, asked questions    .  [facility]) Track and Manage Symptoms-Heart Failure (pt-stated)   On track     Timeframe:  Long-Range Goal Priority:  High Start Date:            04/09/21                 Expected End Date:     08/06/21                  Follow Up Date 05/14/21   - follow rescue plan  if symptoms flare-up - know when to call the doctor - track symptoms and what helps feel better or worse    Notes:  04/09/21 check BP at home, agree to ambulate more in home wt 122.1 denies swelling        Cortne Amara L. Lavina Hamman, RN, BSN, [place] Coordinator Office number [phone] Main [facility] number [phone] Fax number [phone]

## 2021-04-10 ENCOUNTER — Other Ambulatory Visit (HOSPITAL_COMMUNITY): Payer: Self-pay | Admitting: *Deleted

## 2021-04-11 ENCOUNTER — Ambulatory Visit (HOSPITAL_COMMUNITY)
Admission: RE | Admit: 2021-04-11 | Discharge: 2021-04-11 | Disposition: A | Discharge status: Home / Self Care | Payer: Medicare HMO | Source: Ambulatory Visit | Attending: Family Medicine | Admitting: Family Medicine

## 2021-04-11 ENCOUNTER — Encounter (HOSPITAL_COMMUNITY)
Admission: RE | Admit: 2021-04-11 | Discharge: 2021-04-11 | Disposition: A | Discharge status: Home / Self Care | Payer: Medicare HMO | Source: Ambulatory Visit | Attending: Nephrology | Admitting: Nephrology

## 2021-04-11 ENCOUNTER — Other Ambulatory Visit: Payer: Self-pay

## 2021-04-11 VITALS — BP 152/62 | HR 55 | Temp 98.0°F | Resp 20

## 2021-04-11 DIAGNOSIS — D649 Anemia, unspecified: Secondary | ICD-10-CM | POA: Insufficient documentation

## 2021-04-11 DIAGNOSIS — N184 Chronic kidney disease, stage 4 (severe): Secondary | ICD-10-CM | POA: Diagnosis present

## 2021-04-11 DIAGNOSIS — I509 Heart failure, unspecified: Secondary | ICD-10-CM | POA: Diagnosis not present

## 2021-04-11 DIAGNOSIS — D631 Anemia in chronic kidney disease: Secondary | ICD-10-CM | POA: Diagnosis present

## 2021-04-11 LAB — BASIC METABOLIC PANEL
Anion gap: 12 (ref 5–15)
BUN: 73 mg/dL — ABNORMAL HIGH (ref 8–23)
CO2: 22 mmol/L (ref 22–32)
Calcium: 8.5 mg/dL — ABNORMAL LOW (ref 8.9–10.3)
Chloride: 102 mmol/L (ref 98–111)
Creatinine, Ser: 4.89 mg/dL — ABNORMAL HIGH (ref 0.44–1.00)
GFR, Estimated: 8 mL/min — ABNORMAL LOW (ref >60)
Glucose, Bld: 102 mg/dL — ABNORMAL HIGH (ref 70–99)
Potassium: 4.1 mmol/L (ref 3.5–5.1)
Sodium: 136 mmol/L (ref 135–145)

## 2021-04-11 LAB — CBC WITH DIFFERENTIAL/PLATELET
Abs Immature Granulocytes: 0.02 10*3/uL (ref 0.00–0.07)
Basophils Absolute: 0.1 10*3/uL (ref 0.0–0.1)
Basophils Relative: 1 %
Eosinophils Absolute: 0.1 10*3/uL (ref 0.0–0.5)
Eosinophils Relative: 2 %
HCT: 35.4 % — ABNORMAL LOW (ref 36.0–46.0)
Hemoglobin: 11.1 g/dL — ABNORMAL LOW (ref 12.0–15.0)
Immature Granulocytes: 0 %
Lymphocytes Relative: 12 %
Lymphs Abs: 0.7 10*3/uL (ref 0.7–4.0)
MCH: 32.4 pg (ref 26.0–34.0)
MCHC: 31.4 g/dL (ref 30.0–36.0)
MCV: 103.2 fL — ABNORMAL HIGH (ref 80.0–100.0)
Monocytes Absolute: 0.4 10*3/uL (ref 0.1–1.0)
Monocytes Relative: 8 %
Neutro Abs: 4.4 10*3/uL (ref 1.7–7.7)
Neutrophils Relative %: 77 %
Platelets: 323 10*3/uL (ref 150–400)
RBC: 3.43 MIL/uL — ABNORMAL LOW (ref 3.87–5.11)
RDW: 17.8 % — ABNORMAL HIGH (ref 11.5–15.5)
WBC: 5.7 10*3/uL (ref 4.0–10.5)
nRBC: 0 % (ref 0.0–0.2)

## 2021-04-11 LAB — POCT HEMOGLOBIN-HEMACUE: Hemoglobin: 11 g/dL — ABNORMAL LOW (ref 12.0–15.0)

## 2021-04-11 MED ORDER — EPOETIN ALFA 20000 UNIT/ML IJ SOLN
INTRAMUSCULAR | Status: AC
  Filled 2021-04-11: qty 1

## 2021-04-11 MED ORDER — EPOETIN ALFA 20000 UNIT/ML IJ SOLN
20000.0000 [IU] | INTRAMUSCULAR | Status: DC
  Administered 2021-04-11: 20000 [IU] via SUBCUTANEOUS

## 2021-04-16 DIAGNOSIS — E039 Hypothyroidism, unspecified: Secondary | ICD-10-CM | POA: Diagnosis not present

## 2021-04-16 DIAGNOSIS — D519 Vitamin B12 deficiency anemia, unspecified: Secondary | ICD-10-CM | POA: Diagnosis not present

## 2021-04-16 DIAGNOSIS — E785 Hyperlipidemia, unspecified: Secondary | ICD-10-CM | POA: Diagnosis not present

## 2021-04-16 DIAGNOSIS — N2581 Secondary hyperparathyroidism of renal origin: Secondary | ICD-10-CM | POA: Diagnosis not present

## 2021-04-16 DIAGNOSIS — N185 Chronic kidney disease, stage 5: Secondary | ICD-10-CM | POA: Diagnosis not present

## 2021-04-16 DIAGNOSIS — I120 Hypertensive chronic kidney disease with stage 5 chronic kidney disease or end stage renal disease: Secondary | ICD-10-CM | POA: Diagnosis not present

## 2021-04-17 ENCOUNTER — Encounter: Payer: Self-pay | Admitting: *Deleted

## 2021-04-17 DIAGNOSIS — K5730 Diverticulosis of large intestine without perforation or abscess without bleeding: Secondary | ICD-10-CM | POA: Insufficient documentation

## 2021-04-17 DIAGNOSIS — G720 Drug-induced myopathy: Secondary | ICD-10-CM

## 2021-04-17 DIAGNOSIS — G44209 Tension-type headache, unspecified, not intractable: Secondary | ICD-10-CM | POA: Insufficient documentation

## 2021-04-17 DIAGNOSIS — K5901 Slow transit constipation: Secondary | ICD-10-CM | POA: Insufficient documentation

## 2021-04-17 DIAGNOSIS — I129 Hypertensive chronic kidney disease with stage 1 through stage 4 chronic kidney disease, or unspecified chronic kidney disease: Secondary | ICD-10-CM | POA: Insufficient documentation

## 2021-04-17 DIAGNOSIS — I130 Hypertensive heart and chronic kidney disease with heart failure and stage 1 through stage 4 chronic kidney disease, or unspecified chronic kidney disease: Secondary | ICD-10-CM | POA: Insufficient documentation

## 2021-04-17 DIAGNOSIS — I509 Heart failure, unspecified: Secondary | ICD-10-CM | POA: Insufficient documentation

## 2021-04-17 DIAGNOSIS — E7800 Pure hypercholesterolemia, unspecified: Secondary | ICD-10-CM | POA: Insufficient documentation

## 2021-04-17 DIAGNOSIS — M810 Age-related osteoporosis without current pathological fracture: Secondary | ICD-10-CM | POA: Insufficient documentation

## 2021-04-17 HISTORY — DX: Drug-induced myopathy: G72.0

## 2021-04-18 ENCOUNTER — Other Ambulatory Visit: Payer: Self-pay

## 2021-04-18 ENCOUNTER — Encounter (HOSPITAL_COMMUNITY)
Admission: RE | Admit: 2021-04-18 | Discharge: 2021-04-18 | Disposition: A | Discharge status: Home / Self Care | Payer: Medicare HMO | Source: Ambulatory Visit | Attending: Nephrology | Admitting: Nephrology

## 2021-04-18 VITALS — BP 154/53 | HR 61 | Temp 97.4°F

## 2021-04-18 DIAGNOSIS — N184 Chronic kidney disease, stage 4 (severe): Secondary | ICD-10-CM | POA: Diagnosis not present

## 2021-04-18 DIAGNOSIS — D631 Anemia in chronic kidney disease: Secondary | ICD-10-CM

## 2021-04-18 MED ORDER — EPOETIN ALFA 20000 UNIT/ML IJ SOLN: 20000.0000 [IU] | INTRAMUSCULAR | Status: DC

## 2021-04-18 MED ORDER — EPOETIN ALFA 20000 UNIT/ML IJ SOLN
INTRAMUSCULAR | Status: AC
  Administered 2021-04-18: 20000 [IU] via SUBCUTANEOUS
  Filled 2021-04-18: qty 1

## 2021-04-21 DIAGNOSIS — R0602 Shortness of breath: Secondary | ICD-10-CM | POA: Diagnosis not present

## 2021-04-21 DIAGNOSIS — E034 Atrophy of thyroid (acquired): Secondary | ICD-10-CM | POA: Diagnosis not present

## 2021-04-21 DIAGNOSIS — I5022 Chronic systolic (congestive) heart failure: Secondary | ICD-10-CM | POA: Diagnosis not present

## 2021-04-21 DIAGNOSIS — N186 End stage renal disease: Secondary | ICD-10-CM | POA: Diagnosis not present

## 2021-04-23 LAB — POCT HEMOGLOBIN-HEMACUE: Hemoglobin: 11.2 g/dL — ABNORMAL LOW (ref 12.0–15.0)

## 2021-04-24 ENCOUNTER — Other Ambulatory Visit: Payer: Self-pay

## 2021-04-24 ENCOUNTER — Encounter (HOSPITAL_COMMUNITY)
Admission: RE | Admit: 2021-04-24 | Discharge: 2021-04-24 | Disposition: A | Discharge status: Home / Self Care | Payer: Medicare HMO | Source: Ambulatory Visit | Attending: Nephrology | Admitting: Nephrology

## 2021-04-24 VITALS — BP 171/57 | HR 62 | Temp 97.3°F | Resp 20

## 2021-04-24 DIAGNOSIS — N184 Chronic kidney disease, stage 4 (severe): Secondary | ICD-10-CM | POA: Diagnosis not present

## 2021-04-24 DIAGNOSIS — D631 Anemia in chronic kidney disease: Secondary | ICD-10-CM

## 2021-04-24 LAB — IRON AND TIBC
Iron: 47 ug/dL (ref 28–170)
Saturation Ratios: 17 % (ref 10.4–31.8)
TIBC: 281 ug/dL (ref 250–450)
UIBC: 234 ug/dL

## 2021-04-24 LAB — FERRITIN: Ferritin: 113 ng/mL (ref 11–307)

## 2021-04-24 LAB — POCT HEMOGLOBIN-HEMACUE: Hemoglobin: 11.1 g/dL — ABNORMAL LOW (ref 12.0–15.0)

## 2021-04-24 MED ORDER — EPOETIN ALFA 20000 UNIT/ML IJ SOLN
INTRAMUSCULAR | Status: AC
  Filled 2021-04-24: qty 1

## 2021-04-24 MED ORDER — EPOETIN ALFA 20000 UNIT/ML IJ SOLN
20000.0000 [IU] | INTRAMUSCULAR | Status: DC
  Administered 2021-04-24: 20000 [IU] via SUBCUTANEOUS

## 2021-04-25 ENCOUNTER — Encounter (HOSPITAL_COMMUNITY): Payer: Medicare HMO

## 2021-04-29 ENCOUNTER — Other Ambulatory Visit: Payer: Self-pay | Admitting: *Deleted

## 2021-04-29 NOTE — Patient Outreach (Signed)
[place] [facility]) Care Management  04/29/2021  Alicia Sellers [birth year] [id number]   THN follow up for post hospital/complex care patient  Alicia Sellers was referred to [facility] on 03/25/21 by [facility] hospital liaison for post hospital stay/complex care services    Insurance Aetna Medicare   Last admission: 03/22/21-03/24/21  Acute on chronic systolic heart failure, Dyspnea, Chronic Kidney disease (CKD) stage 5   Patient is able to verify HIPAA ([place] and [place]) identifiers Reviewed and addressed the purpose of the follow up call with the patient  Consent: THN([facility]) RN CM reviewed [place] with patient. Patient gave verbal consent for services.  Follow up assessment Alicia Sellers, daughter answered and reported the patient was not available but would be later in the day. She reports Alicia Sellers is doing better  Discussed the purpose for the outreach [facility] RN CM left HIPAA HIPAA ([place] and Accountability Act) compliant voicemail message along with CM's contact info.     [facility] RN CM will follow up within the next 7-10 business days Pt encouraged to return a call to [facility] RN CM prn  Alicia Sellers L. Lavina Hamman, RN, BSN, [place] Coordinator Office number [phone] Main [facility] number [phone] Fax number [phone]

## 2021-05-01 ENCOUNTER — Encounter (HOSPITAL_COMMUNITY): Payer: Medicare HMO

## 2021-05-01 DIAGNOSIS — D649 Anemia, unspecified: Secondary | ICD-10-CM | POA: Diagnosis not present

## 2021-05-01 DIAGNOSIS — R0989 Other specified symptoms and signs involving the circulatory and respiratory systems: Secondary | ICD-10-CM | POA: Diagnosis not present

## 2021-05-01 DIAGNOSIS — Z1389 Encounter for screening for other disorder: Secondary | ICD-10-CM | POA: Diagnosis not present

## 2021-05-01 DIAGNOSIS — E7800 Pure hypercholesterolemia, unspecified: Secondary | ICD-10-CM | POA: Diagnosis not present

## 2021-05-01 DIAGNOSIS — R69 Illness, unspecified: Secondary | ICD-10-CM | POA: Diagnosis not present

## 2021-05-01 DIAGNOSIS — G720 Drug-induced myopathy: Secondary | ICD-10-CM | POA: Diagnosis not present

## 2021-05-01 DIAGNOSIS — I129 Hypertensive chronic kidney disease with stage 1 through stage 4 chronic kidney disease, or unspecified chronic kidney disease: Secondary | ICD-10-CM | POA: Diagnosis not present

## 2021-05-01 DIAGNOSIS — N184 Chronic kidney disease, stage 4 (severe): Secondary | ICD-10-CM | POA: Diagnosis not present

## 2021-05-01 DIAGNOSIS — Z0000 Encounter for general adult medical examination without abnormal findings: Secondary | ICD-10-CM | POA: Diagnosis not present

## 2021-05-01 DIAGNOSIS — M1990 Unspecified osteoarthritis, unspecified site: Secondary | ICD-10-CM | POA: Diagnosis not present

## 2021-05-01 DIAGNOSIS — E039 Hypothyroidism, unspecified: Secondary | ICD-10-CM | POA: Diagnosis not present

## 2021-05-02 ENCOUNTER — Other Ambulatory Visit: Payer: Self-pay

## 2021-05-02 ENCOUNTER — Encounter (HOSPITAL_COMMUNITY)
Admission: RE | Admit: 2021-05-02 | Discharge: 2021-05-02 | Disposition: A | Discharge status: Home / Self Care | Payer: Medicare HMO | Source: Ambulatory Visit | Attending: Nephrology | Admitting: Nephrology

## 2021-05-02 VITALS — BP 160/63 | HR 58 | Temp 97.2°F | Resp 20

## 2021-05-02 DIAGNOSIS — D631 Anemia in chronic kidney disease: Secondary | ICD-10-CM

## 2021-05-02 DIAGNOSIS — N184 Chronic kidney disease, stage 4 (severe): Secondary | ICD-10-CM | POA: Diagnosis not present

## 2021-05-02 LAB — POCT HEMOGLOBIN-HEMACUE: Hemoglobin: 12.2 g/dL (ref 12.0–15.0)

## 2021-05-02 MED ORDER — EPOETIN ALFA 20000 UNIT/ML IJ SOLN: 20000.0000 [IU] | INTRAMUSCULAR | Status: DC

## 2021-05-06 ENCOUNTER — Other Ambulatory Visit: Payer: Self-pay | Admitting: *Deleted

## 2021-05-06 ENCOUNTER — Other Ambulatory Visit: Payer: Self-pay

## 2021-05-06 NOTE — Patient Outreach (Addendum)
[place] [facility]) Care Management  05/06/2021  Alicia Sellers 21-Mar-1929 [id number]  THN follow up for post hospital/complex care patient  Alicia Sellers was referred to [facility] on 03/25/21 by [facility] hospital liaison for post hospital stay/complex care services   InsuranceAetna Medicare   Last admission: 03/22/21-03/24/21  Acute on chronic systolic heart failure, Dyspnea,Chronic Kidney disease (CKD)stage 5   Patient is able to verify HIPAA ([place] and [place]) identifiers Reviewed and addressed the purpose of the follow up call with the patient  Consent: THN([facility]) RN CM reviewed [place] with patient. Patient gave verbal consent for services.  Follow up assessment Alicia Sellers reports she continues to do well at home  With congestive Heart Failure (CHF) assessment she confirms she generally has very little swelling of her lower extremities She notices more swelling late in the evenings only  She does admit to shortness of breath (sob) with activity only  She confirms she weighs every day Her weight today was 118 lb and she reports her weight varies "only ounces" Saw primary care provider (PCP) last week  Forgot to discuss her shortness of breath (sob) with her pcp  With assessment of her shortness of breath she reports she stays as active as she is able at age 87, She walks around her home and reports she only goes out of her home to go to her MD offices. Detailed education on CHF, CHF action plans completed Various questions answered She was encouraged and able to repeat back that it is important that she report any weigh gains, or any shortness of breath at rest or walking from room to room with her cardiologist and/or pcp She voiced understanding    Chronic Kidney disease (CKD) Mentioned and she agrees to being informed of being in stage 5  Stage 5 discussed and she reports she does NOT want to have  dialysis She agrees to [facility] education to be sent and to review this  THN progression  agrees to outreach for CKD 5 disease management   [facility] RN CM will follow upwithin the next 30-36 business days as pt agreed Pt encouraged to return a call to [facility] RN CM prn  Goals Addressed              This Visit's Progress     Patient Stated   .  (THN) Lifestyle Change-Hypertension (pt-stated)   On track     Timeframe:  Short-Term Goal Priority:  Medium Start Date:           04/09/21                  Expected End Date:          06/05/21             Follow Up Date 06/05/21   - ask questions to understand - learn about high blood pressure     Notes:  05/06/21 reports BP WNL, monitoring at home  04/09/21 check BP at home, asked questions    .  [facility]) Track and Manage Symptoms-Heart Failure (pt-stated)   On track     Timeframe:  Long-Range Goal Priority:  High Start Date:            04/09/21                 Expected End Date:     08/06/21  Follow Up Date 06/05/21   - follow rescue plan if symptoms flare-up - know when to call the doctor - track symptoms and what helps feel better or worse     Notes:  05/06/21 weigh daily, know to call if wt 3+ lbs, with teach back able to voice CHF plan when to call for shortness of breath 04/09/21 check BP at home, agree to ambulate more in home wt 122.1 denies swelling       Jerald Villalona L. Lavina Hamman, RN, BSN, [place] Coordinator Office number [phone] Main [facility] number [phone] Fax number [phone]

## 2021-05-09 ENCOUNTER — Encounter (HOSPITAL_COMMUNITY): Payer: Medicare HMO

## 2021-05-14 ENCOUNTER — Ambulatory Visit: Payer: Medicare HMO | Admitting: *Deleted

## 2021-05-16 ENCOUNTER — Other Ambulatory Visit: Payer: Self-pay

## 2021-05-16 ENCOUNTER — Encounter (HOSPITAL_COMMUNITY)
Admission: RE | Admit: 2021-05-16 | Discharge: 2021-05-16 | Disposition: A | Discharge status: Home / Self Care | Payer: Medicare HMO | Source: Ambulatory Visit | Attending: Nephrology | Admitting: Nephrology

## 2021-05-16 VITALS — BP 146/54 | HR 57 | Temp 97.5°F | Resp 20

## 2021-05-16 DIAGNOSIS — D631 Anemia in chronic kidney disease: Secondary | ICD-10-CM | POA: Insufficient documentation

## 2021-05-16 DIAGNOSIS — N184 Chronic kidney disease, stage 4 (severe): Secondary | ICD-10-CM | POA: Diagnosis present

## 2021-05-16 LAB — POCT HEMOGLOBIN-HEMACUE: Hemoglobin: 10.7 g/dL — ABNORMAL LOW (ref 12.0–15.0)

## 2021-05-16 MED ORDER — EPOETIN ALFA 20000 UNIT/ML IJ SOLN
INTRAMUSCULAR | Status: AC
  Administered 2021-05-16: 20000 [IU] via SUBCUTANEOUS
  Filled 2021-05-16: qty 1

## 2021-05-16 MED ORDER — EPOETIN ALFA 20000 UNIT/ML IJ SOLN: 20000.0000 [IU] | INTRAMUSCULAR | Status: DC

## 2021-05-19 DIAGNOSIS — E034 Atrophy of thyroid (acquired): Secondary | ICD-10-CM | POA: Diagnosis not present

## 2021-05-19 DIAGNOSIS — R0602 Shortness of breath: Secondary | ICD-10-CM | POA: Diagnosis not present

## 2021-05-19 DIAGNOSIS — N184 Chronic kidney disease, stage 4 (severe): Secondary | ICD-10-CM | POA: Diagnosis not present

## 2021-05-19 DIAGNOSIS — I5022 Chronic systolic (congestive) heart failure: Secondary | ICD-10-CM | POA: Diagnosis not present

## 2021-05-23 ENCOUNTER — Encounter (HOSPITAL_COMMUNITY)
Admission: RE | Admit: 2021-05-23 | Discharge: 2021-05-23 | Disposition: A | Discharge status: Home / Self Care | Payer: Medicare HMO | Source: Ambulatory Visit | Attending: Nephrology | Admitting: Nephrology

## 2021-05-23 ENCOUNTER — Other Ambulatory Visit: Payer: Self-pay

## 2021-05-23 VITALS — BP 152/58 | HR 63 | Temp 97.4°F | Resp 20

## 2021-05-23 DIAGNOSIS — N184 Chronic kidney disease, stage 4 (severe): Secondary | ICD-10-CM | POA: Diagnosis not present

## 2021-05-23 DIAGNOSIS — D631 Anemia in chronic kidney disease: Secondary | ICD-10-CM | POA: Diagnosis not present

## 2021-05-23 LAB — POCT HEMOGLOBIN-HEMACUE: Hemoglobin: 11.3 g/dL — ABNORMAL LOW (ref 12.0–15.0)

## 2021-05-23 LAB — IRON AND TIBC
Iron: 51 ug/dL (ref 28–170)
Saturation Ratios: 20 % (ref 10.4–31.8)
TIBC: 252 ug/dL (ref 250–450)
UIBC: 201 ug/dL

## 2021-05-23 LAB — FERRITIN: Ferritin: 204 ng/mL (ref 11–307)

## 2021-05-23 MED ORDER — EPOETIN ALFA 20000 UNIT/ML IJ SOLN
INTRAMUSCULAR | Status: AC
  Filled 2021-05-23: qty 1

## 2021-05-23 MED ORDER — EPOETIN ALFA 20000 UNIT/ML IJ SOLN
20000.0000 [IU] | INTRAMUSCULAR | Status: DC
  Administered 2021-05-23: 20000 [IU] via SUBCUTANEOUS

## 2021-05-29 DIAGNOSIS — D631 Anemia in chronic kidney disease: Secondary | ICD-10-CM | POA: Diagnosis not present

## 2021-05-29 DIAGNOSIS — I509 Heart failure, unspecified: Secondary | ICD-10-CM | POA: Diagnosis not present

## 2021-05-29 DIAGNOSIS — I130 Hypertensive heart and chronic kidney disease with heart failure and stage 1 through stage 4 chronic kidney disease, or unspecified chronic kidney disease: Secondary | ICD-10-CM | POA: Diagnosis not present

## 2021-05-29 DIAGNOSIS — R69 Illness, unspecified: Secondary | ICD-10-CM | POA: Diagnosis not present

## 2021-05-29 DIAGNOSIS — N184 Chronic kidney disease, stage 4 (severe): Secondary | ICD-10-CM | POA: Diagnosis not present

## 2021-05-30 ENCOUNTER — Encounter (HOSPITAL_COMMUNITY)
Admission: RE | Admit: 2021-05-30 | Discharge: 2021-05-30 | Disposition: A | Discharge status: Home / Self Care | Payer: Medicare HMO | Source: Ambulatory Visit | Attending: Nephrology | Admitting: Nephrology

## 2021-05-30 ENCOUNTER — Other Ambulatory Visit: Payer: Self-pay

## 2021-05-30 VITALS — BP 137/48 | HR 61 | Temp 97.7°F | Resp 20

## 2021-05-30 DIAGNOSIS — R69 Illness, unspecified: Secondary | ICD-10-CM | POA: Diagnosis not present

## 2021-05-30 DIAGNOSIS — I130 Hypertensive heart and chronic kidney disease with heart failure and stage 1 through stage 4 chronic kidney disease, or unspecified chronic kidney disease: Secondary | ICD-10-CM | POA: Diagnosis not present

## 2021-05-30 DIAGNOSIS — D631 Anemia in chronic kidney disease: Secondary | ICD-10-CM

## 2021-05-30 DIAGNOSIS — N184 Chronic kidney disease, stage 4 (severe): Secondary | ICD-10-CM

## 2021-05-30 DIAGNOSIS — I509 Heart failure, unspecified: Secondary | ICD-10-CM | POA: Diagnosis not present

## 2021-05-30 LAB — POCT HEMOGLOBIN-HEMACUE: Hemoglobin: 11.6 g/dL — ABNORMAL LOW (ref 12.0–15.0)

## 2021-05-30 MED ORDER — EPOETIN ALFA 20000 UNIT/ML IJ SOLN
INTRAMUSCULAR | Status: AC
  Filled 2021-05-30: qty 1

## 2021-05-30 MED ORDER — EPOETIN ALFA 20000 UNIT/ML IJ SOLN
20000.0000 [IU] | INTRAMUSCULAR | Status: DC
  Administered 2021-05-30: 20000 [IU] via SUBCUTANEOUS

## 2021-06-02 DIAGNOSIS — N184 Chronic kidney disease, stage 4 (severe): Secondary | ICD-10-CM | POA: Diagnosis not present

## 2021-06-02 DIAGNOSIS — I509 Heart failure, unspecified: Secondary | ICD-10-CM | POA: Diagnosis not present

## 2021-06-02 DIAGNOSIS — I130 Hypertensive heart and chronic kidney disease with heart failure and stage 1 through stage 4 chronic kidney disease, or unspecified chronic kidney disease: Secondary | ICD-10-CM | POA: Diagnosis not present

## 2021-06-02 DIAGNOSIS — D631 Anemia in chronic kidney disease: Secondary | ICD-10-CM | POA: Diagnosis not present

## 2021-06-02 DIAGNOSIS — R69 Illness, unspecified: Secondary | ICD-10-CM | POA: Diagnosis not present

## 2021-06-03 DIAGNOSIS — I130 Hypertensive heart and chronic kidney disease with heart failure and stage 1 through stage 4 chronic kidney disease, or unspecified chronic kidney disease: Secondary | ICD-10-CM | POA: Diagnosis not present

## 2021-06-03 DIAGNOSIS — N184 Chronic kidney disease, stage 4 (severe): Secondary | ICD-10-CM | POA: Diagnosis not present

## 2021-06-03 DIAGNOSIS — R69 Illness, unspecified: Secondary | ICD-10-CM | POA: Diagnosis not present

## 2021-06-03 DIAGNOSIS — I509 Heart failure, unspecified: Secondary | ICD-10-CM | POA: Diagnosis not present

## 2021-06-03 DIAGNOSIS — D631 Anemia in chronic kidney disease: Secondary | ICD-10-CM | POA: Diagnosis not present

## 2021-06-05 ENCOUNTER — Other Ambulatory Visit: Payer: Self-pay | Admitting: *Deleted

## 2021-06-05 ENCOUNTER — Other Ambulatory Visit: Payer: Self-pay

## 2021-06-05 DIAGNOSIS — M810 Age-related osteoporosis without current pathological fracture: Secondary | ICD-10-CM | POA: Diagnosis not present

## 2021-06-05 DIAGNOSIS — I129 Hypertensive chronic kidney disease with stage 1 through stage 4 chronic kidney disease, or unspecified chronic kidney disease: Secondary | ICD-10-CM | POA: Diagnosis not present

## 2021-06-05 DIAGNOSIS — N184 Chronic kidney disease, stage 4 (severe): Secondary | ICD-10-CM | POA: Diagnosis not present

## 2021-06-05 DIAGNOSIS — D519 Vitamin B12 deficiency anemia, unspecified: Secondary | ICD-10-CM | POA: Diagnosis not present

## 2021-06-05 DIAGNOSIS — E039 Hypothyroidism, unspecified: Secondary | ICD-10-CM | POA: Diagnosis not present

## 2021-06-05 DIAGNOSIS — E785 Hyperlipidemia, unspecified: Secondary | ICD-10-CM | POA: Diagnosis not present

## 2021-06-05 DIAGNOSIS — M1990 Unspecified osteoarthritis, unspecified site: Secondary | ICD-10-CM | POA: Diagnosis not present

## 2021-06-05 DIAGNOSIS — N2581 Secondary hyperparathyroidism of renal origin: Secondary | ICD-10-CM | POA: Diagnosis not present

## 2021-06-05 DIAGNOSIS — E7800 Pure hypercholesterolemia, unspecified: Secondary | ICD-10-CM | POA: Diagnosis not present

## 2021-06-05 DIAGNOSIS — N185 Chronic kidney disease, stage 5: Secondary | ICD-10-CM | POA: Diagnosis not present

## 2021-06-05 DIAGNOSIS — N189 Chronic kidney disease, unspecified: Secondary | ICD-10-CM | POA: Diagnosis not present

## 2021-06-05 DIAGNOSIS — I130 Hypertensive heart and chronic kidney disease with heart failure and stage 1 through stage 4 chronic kidney disease, or unspecified chronic kidney disease: Secondary | ICD-10-CM | POA: Diagnosis not present

## 2021-06-05 DIAGNOSIS — I120 Hypertensive chronic kidney disease with stage 5 chronic kidney disease or end stage renal disease: Secondary | ICD-10-CM | POA: Diagnosis not present

## 2021-06-05 DIAGNOSIS — I509 Heart failure, unspecified: Secondary | ICD-10-CM | POA: Diagnosis not present

## 2021-06-05 NOTE — Patient Outreach (Signed)
[facility] ([facility]) Care Management  06/05/2021  Alicia Sellers 12/06/1929 [id number]  [facility] follow up for post hospital/complex care patient   Alicia Sellers was referred to [facility] on 03/25/21 by [facility] hospital liaison for post hospital stay/complex care services     Insurance Aetna Medicare   Last admission: 03/22/21-03/24/21 Acute on chronic systolic heart failure, Dyspnea, Chronic Kidney disease (CKD) stage 5     Patient is able to verify HIPAA (Health Insurance Portability and Accountability Act) identifiers Reviewed and addressed the purpose of the follow up call with the patient   Consent: [facility] ([facility]) RN CM reviewed [facility] services with patient. Patient gave verbal consent for services.   Follow up assessment/education for Chronic Kidney disease (CKD) and congestive Heart Failure (CHF) Detailed education on CKD description, signs/symptoms, medications, home self care treatments (transplant, hemodialysis and peritoneal dialysis) and worsening symptoms Discussed the importance of avoiding nonsteroidal antiinflammatory drugs Use tylenol only She is aware of the worsening CHF symptoms prior to her last admission to include Coughing, falls related to shortness of breath (sob)   Discussed correlation of CHF and CKD Pt able to states she weigh daily to know if gain weight voiced general weight is 122 lbs  BMI 23.08 discussed with patient and importance of obesity prevention Nephrology seen today Dr Webb Every Friday she receives a procrit shot   Education started on systolic heart failure  Explained the differences in systolic, diastolic  Informed her systolic heart failure means your heart does not contract effectively with each heartbeat.   Patient Active Problem List   Diagnosis Date Noted   Age-related osteoporosis without current pathological fracture 04/17/2021   Chronic kidney disease due to benign hypertension 04/17/2021   Congestive heart failure (HCC)  04/17/2021   Diverticulosis of colon 04/17/2021   Drug-induced myopathy 04/17/2021   Hypertensive heart and chronic kidney disease with heart failure and stage 1 through stage 4 chronic kidney disease, or unspecified chronic kidney disease (HCC) 04/17/2021   Muscle contraction headache 04/17/2021   Pure hypercholesterolemia 04/17/2021   Slow transit constipation 04/17/2021   Acute on chronic systolic CHF (congestive heart failure) (HCC) 03/22/2021   Osteoarthritis 05/08/2020   Sepsis due to Escherichia coli (E. coli) (HCC) 05/08/2020   Transaminitis 05/08/2020   Chronic systolic CHF (congestive heart failure) (HCC) 05/08/2020   Sepsis (HCC) 05/08/2020   Acute on chronic left systolic heart failure (HCC) 10/26/2019   Otorrhea of left ear 05/23/2019   Metabolic acidosis, normal anion gap (NAG) 12/02/2018   Dyspnea 12/01/2018   HTN (hypertension) 12/01/2018   Chronic kidney disease, stage 4 (severe) (HCC) 12/01/2018   Hypothyroidism 12/01/2018   Anxiety 12/01/2018   Anemia due to chronic kidney disease 12/01/2018   Left chronic serous otitis media 03/31/2018   Presbycusis of both ears 03/17/2018   Arthritis 01/06/2012    [facility] progression discussed Discussed differences in complex and disease management  Pt reports preference for outreach for complex care management within the next 60-75 business days  Plans Outreach within the next 60-75 business days Pt encouraged to return a call to [facility] RN CM prn  Goals Addressed               This Visit's Progress     Patient Stated     ([facility]) Follow My Treatment Plan-Chronic Kidney (pt-stated)   On track     Timeframe:  Long-Range Goal Priority:  High Start Date:                         06/05/21      Expected End Date:               09/05/21        Follow Up Date 08/06/21  Barriers:Knowledge    - ask for help if I can't afford my medicines - call the doctor or nurse to get help with side effects - keep follow-up appointments        Notes:  06/05/21 continues to monitor BP at home Able to state a low BP is a risk for falls Voiced understanding of the home monitoring for hypotension and hypertension Aware of importance of managing BP to prevent worsening symptoms of CKD and CHF       COMPLETED: ([facility]) Lifestyle Change-Hypertension (pt-stated)   On track     Timeframe:  Short-Term Goal Priority:  Medium Start Date:           04/09/21                  Expected End Date:          06/05/21             Follow Up Date 06/05/21   - ask questions to understand - learn about high blood pressure    Why is this important?   The changes that you are asked to make may be hard to do.  This is especially true when the changes are life-long.  Knowing why it is important to you is the first step.  Working on the change with your family or support person helps you not feel alone.  Reward yourself and family or support person when goals are met. This can be an activity you choose like bowling, hiking, biking, swimming or shooting hoops.     Notes:  06/05/21 goal met continues to monitor BP at home Able to state a low BP is a risk for falls Voiced understanding of the home monitoring for hypotension and hypertension Aware of importance of managing BP to prevent worsening symptoms of CKD and CHF 05/06/21 reports BP WNL, monitoring at home  04/09/21 check BP at home, asked questions       ([facility]) Track and Manage My Symptoms-Chronic Kidney (pt-stated)   On track     Timeframe:  Long-Range Goal Priority:  High Start Date:              06/05/21               Expected End Date:        09/05/21               Follow Up Date 08/06/21 Barriers:Knowledge     - balance periods of rest with activity as needed - keep all lab appointments - make shared treatment decisions with doctor - pace activity allowing for rest - take medicine as prescribed - watch for early signs of feeling worse    Why is this important?   Keeping track of symptoms can  help you feel the best. It also helps the doctor stay on top of any changes to the disease. It may also help keep your disease from getting worse.  Taking simple steps can help you cope with symptoms like feeling very tired or itchy skin.     Notes:  06/05/21 Take naps. Keeps all appointments, continues to monitor BP at home Able to state a low BP is a risk for falls Voiced understanding of the home monitoring for hypotension and hypertension   Aware of importance of managing BP to prevent worsening symptoms of CKD and CHF       [facility]) Track and Manage Symptoms-Heart Failure (pt-stated)   On track     Timeframe:  Long-Range Goal Priority:  High Start Date:            04/09/21                 Expected End Date:     08/06/21                  Follow Up Date 08/06/21 Barriers: Knowledge    - follow rescue plan if symptoms flare-up - know when to call the doctor - track symptoms and what helps feel better or worse    Why is this important?   You will be able to handle your symptoms better if you keep track of them.  Making some simple changes to your lifestyle will help.  Eating healthy is one thing you can do to take good care of yourself.    Notes:  06/05/21 wt 122  Aware of importance of managing BP to prevent worsening symptoms of CKD and CHF 05/06/21 weigh daily, know to call if wt 3+ lbs, with teach back able to voice CHF plan when to call for shortness of breath 04/09/21 check BP at home, agree to ambulate more in home wt 122.1 denies swelling          Isabelle Matt L. Lavina Hamman, RN, BSN, [place] Coordinator Office number [phone] Main [facility] number [phone] Fax number [phone]

## 2021-06-06 ENCOUNTER — Ambulatory Visit (HOSPITAL_COMMUNITY)
Admission: RE | Admit: 2021-06-06 | Discharge: 2021-06-06 | Disposition: A | Discharge status: Home / Self Care | Payer: Medicare HMO | Source: Ambulatory Visit | Attending: Nephrology | Admitting: Nephrology

## 2021-06-06 VITALS — BP 157/56 | HR 59 | Temp 97.2°F | Resp 20

## 2021-06-06 DIAGNOSIS — D631 Anemia in chronic kidney disease: Secondary | ICD-10-CM | POA: Diagnosis present

## 2021-06-06 DIAGNOSIS — N184 Chronic kidney disease, stage 4 (severe): Secondary | ICD-10-CM | POA: Insufficient documentation

## 2021-06-06 LAB — POCT HEMOGLOBIN-HEMACUE: Hemoglobin: 11.4 g/dL — ABNORMAL LOW (ref 12.0–15.0)

## 2021-06-06 MED ORDER — EPOETIN ALFA 20000 UNIT/ML IJ SOLN
INTRAMUSCULAR | Status: AC
  Filled 2021-06-06: qty 1

## 2021-06-06 MED ORDER — EPOETIN ALFA 20000 UNIT/ML IJ SOLN
20000.0000 [IU] | INTRAMUSCULAR | Status: DC
  Administered 2021-06-06: 20000 [IU] via SUBCUTANEOUS

## 2021-06-11 DIAGNOSIS — I509 Heart failure, unspecified: Secondary | ICD-10-CM | POA: Diagnosis not present

## 2021-06-11 DIAGNOSIS — R69 Illness, unspecified: Secondary | ICD-10-CM | POA: Diagnosis not present

## 2021-06-11 DIAGNOSIS — N184 Chronic kidney disease, stage 4 (severe): Secondary | ICD-10-CM | POA: Diagnosis not present

## 2021-06-11 DIAGNOSIS — D631 Anemia in chronic kidney disease: Secondary | ICD-10-CM | POA: Diagnosis not present

## 2021-06-11 DIAGNOSIS — I130 Hypertensive heart and chronic kidney disease with heart failure and stage 1 through stage 4 chronic kidney disease, or unspecified chronic kidney disease: Secondary | ICD-10-CM | POA: Diagnosis not present

## 2021-06-12 DIAGNOSIS — D631 Anemia in chronic kidney disease: Secondary | ICD-10-CM | POA: Diagnosis not present

## 2021-06-12 DIAGNOSIS — I130 Hypertensive heart and chronic kidney disease with heart failure and stage 1 through stage 4 chronic kidney disease, or unspecified chronic kidney disease: Secondary | ICD-10-CM | POA: Diagnosis not present

## 2021-06-12 DIAGNOSIS — N184 Chronic kidney disease, stage 4 (severe): Secondary | ICD-10-CM | POA: Diagnosis not present

## 2021-06-12 DIAGNOSIS — R69 Illness, unspecified: Secondary | ICD-10-CM | POA: Diagnosis not present

## 2021-06-12 DIAGNOSIS — I509 Heart failure, unspecified: Secondary | ICD-10-CM | POA: Diagnosis not present

## 2021-06-13 ENCOUNTER — Encounter (HOSPITAL_COMMUNITY)
Admission: RE | Admit: 2021-06-13 | Discharge: 2021-06-13 | Disposition: A | Discharge status: Home / Self Care | Payer: Medicare HMO | Source: Ambulatory Visit | Attending: Nephrology | Admitting: Nephrology

## 2021-06-13 ENCOUNTER — Other Ambulatory Visit: Payer: Self-pay

## 2021-06-13 VITALS — BP 170/57 | HR 60 | Temp 97.8°F | Resp 18

## 2021-06-13 DIAGNOSIS — D631 Anemia in chronic kidney disease: Secondary | ICD-10-CM

## 2021-06-13 DIAGNOSIS — N184 Chronic kidney disease, stage 4 (severe): Secondary | ICD-10-CM | POA: Diagnosis not present

## 2021-06-13 LAB — POCT HEMOGLOBIN-HEMACUE: Hemoglobin: 11.8 g/dL — ABNORMAL LOW (ref 12.0–15.0)

## 2021-06-13 MED ORDER — EPOETIN ALFA 20000 UNIT/ML IJ SOLN
INTRAMUSCULAR | Status: AC
  Filled 2021-06-13: qty 1

## 2021-06-13 MED ORDER — EPOETIN ALFA 20000 UNIT/ML IJ SOLN
20000.0000 [IU] | INTRAMUSCULAR | Status: DC
  Administered 2021-06-13: 20000 [IU] via SUBCUTANEOUS

## 2021-06-16 DIAGNOSIS — D631 Anemia in chronic kidney disease: Secondary | ICD-10-CM | POA: Diagnosis not present

## 2021-06-16 DIAGNOSIS — N184 Chronic kidney disease, stage 4 (severe): Secondary | ICD-10-CM | POA: Diagnosis not present

## 2021-06-16 DIAGNOSIS — R69 Illness, unspecified: Secondary | ICD-10-CM | POA: Diagnosis not present

## 2021-06-16 DIAGNOSIS — I509 Heart failure, unspecified: Secondary | ICD-10-CM | POA: Diagnosis not present

## 2021-06-16 DIAGNOSIS — I130 Hypertensive heart and chronic kidney disease with heart failure and stage 1 through stage 4 chronic kidney disease, or unspecified chronic kidney disease: Secondary | ICD-10-CM | POA: Diagnosis not present

## 2021-06-18 ENCOUNTER — Encounter: Payer: Self-pay | Admitting: Emergency Medicine

## 2021-06-18 ENCOUNTER — Ambulatory Visit
Admission: EM | Admit: 2021-06-18 | Discharge: 2021-06-18 | Disposition: A | Discharge status: Home / Self Care | Payer: Medicare HMO

## 2021-06-18 DIAGNOSIS — R1032 Left lower quadrant pain: Secondary | ICD-10-CM | POA: Diagnosis not present

## 2021-06-18 DIAGNOSIS — E039 Hypothyroidism, unspecified: Secondary | ICD-10-CM | POA: Diagnosis not present

## 2021-06-18 DIAGNOSIS — R69 Illness, unspecified: Secondary | ICD-10-CM | POA: Diagnosis not present

## 2021-06-18 DIAGNOSIS — Z8719 Personal history of other diseases of the digestive system: Secondary | ICD-10-CM

## 2021-06-18 DIAGNOSIS — N185 Chronic kidney disease, stage 5: Secondary | ICD-10-CM | POA: Diagnosis not present

## 2021-06-18 DIAGNOSIS — N184 Chronic kidney disease, stage 4 (severe): Secondary | ICD-10-CM | POA: Diagnosis not present

## 2021-06-18 DIAGNOSIS — I509 Heart failure, unspecified: Secondary | ICD-10-CM | POA: Diagnosis not present

## 2021-06-18 DIAGNOSIS — D631 Anemia in chronic kidney disease: Secondary | ICD-10-CM | POA: Diagnosis not present

## 2021-06-18 DIAGNOSIS — I130 Hypertensive heart and chronic kidney disease with heart failure and stage 1 through stage 4 chronic kidney disease, or unspecified chronic kidney disease: Secondary | ICD-10-CM | POA: Diagnosis not present

## 2021-06-18 DIAGNOSIS — R109 Unspecified abdominal pain: Secondary | ICD-10-CM | POA: Diagnosis not present

## 2021-06-18 NOTE — ED Provider Notes (Signed)
[place]   MRN: [id number] DOB: 15-Jun-1929  Subjective:   Alicia Sellers is a 87 y.o. female presenting for 1 day history of intermittent lower abdominal pain that is mild in nature, currently rated 1 out of 10.  Reports that she has a history of intermittent constipation but over the past week has had a bowel movement every other day.  Last bowel movement was yesterday, had multiple loose stools.  Denies fever, headache, confusion, dizziness, numbness or tingling, chest pain, shortness of breath, bloody stools, diarrhea, nausea, vomiting.  She has a history of diffuse diverticulosis but to her knowledge has never had diverticulitis.  No history of abdominal surgeries.  She has previously had urinary tract infections but reports that she does not have any dysuria, urinary frequency or hematuria.  Both the patient and her granddaughter report that she normally is very aware about whether or not she has a UTI and this does not feel like it.  No current facility-administered medications for this encounter.  Current Outpatient Medications:    acetaminophen (TYLENOL) 500 MG tablet, Take 1,000 mg by mouth every 6 (six) hours as needed for mild pain., Disp: , Rfl:    amLODipine (NORVASC) 5 MG tablet, Take by mouth., Disp: , Rfl:    aspirin 81 MG EC tablet, Take 81 mg by mouth daily., Disp: , Rfl:    citalopram (CELEXA) 10 MG tablet, Take 10 mg by mouth daily., Disp: , Rfl:    diclofenac Sodium (VOLTAREN) 1 % GEL, Apply 2 g topically 4 (four) times daily. (Patient taking differently: Apply 2 g topically 2 (two) times daily as needed (pain).), Disp: 100 g, Rfl: 0   epoetin alfa (PROCRIT) 4000 UNIT/ML injection, , Disp: , Rfl:    furosemide (LASIX) 20 MG tablet, Take 20 mg by mouth daily., Disp: , Rfl:    gemfibrozil (LOPID) 600 MG tablet, Take 600 mg by mouth 2 (two) times daily before a meal., Disp: , Rfl:    hydrALAZINE (APRESOLINE) 25 MG tablet, Take 50 mg by mouth in the morning  and at bedtime. , Disp: , Rfl:    levothyroxine (SYNTHROID) 50 MCG tablet, Take 50 mcg by mouth daily before breakfast., Disp: , Rfl:    metoprolol succinate (TOPROL-XL) 25 MG 24 hr tablet, Take 12.5 mg by mouth every evening. , Disp: , Rfl:    Omega-3 Fatty Acids (FISH OIL) 1200 MG CAPS, Take 1,200 mg by mouth in the morning and at bedtime., Disp: , Rfl:    ondansetron (ZOFRAN ODT) 4 MG disintegrating tablet, Take 1 tablet (4 mg total) by mouth every 8 (eight) hours as needed for nausea or vomiting. Take 30 minutes before taking Cipro, Disp: 20 tablet, Rfl: 0   ondansetron (ZOFRAN) 8 MG tablet, Take 1 tablet (8 mg total) by mouth every 8 (eight) hours as needed for nausea or vomiting., Disp: 20 tablet, Rfl: 0   sodium bicarbonate 650 MG tablet, Take 650 mg by mouth in the morning, at noon, and at bedtime., Disp: , Rfl:    ursodiol (ACTIGALL) 300 MG capsule, Take 300 mg by mouth 2 (two) times daily., Disp: , Rfl:    Wheat Dextrin (BENEFIBER DRINK MIX PO), Take 1 Scoop by mouth daily as needed (constipation)., Disp: , Rfl:    amLODipine (NORVASC) 10 MG tablet, Take 5 mg by mouth 2 (two) times daily., Disp: , Rfl:    fexofenadine (ALLEGRA) 180 MG tablet, Take 180 mg by mouth daily as needed for allergies.,  Disp: , Rfl:    fluticasone (FLONASE) 50 MCG/ACT nasal spray, Place 2 sprays into both nostrils daily as needed for allergies., Disp: , Rfl:    LORazepam (ATIVAN) 0.5 MG tablet, Take 0.25 mg by mouth at bedtime. , Disp: , Rfl:    meclizine (ANTIVERT) 25 MG tablet, Take 25 mg by mouth 3 (three) times daily as needed for dizziness (vertigo)., Disp: , Rfl:    metaxalone (SKELAXIN) 800 MG tablet, Take by mouth., Disp: , Rfl:    polyethylene glycol (MIRALAX / GLYCOLAX) 17 g packet, Take 17 g by mouth daily as needed for mild constipation or moderate constipation., Disp: 30 packet, Rfl: 3   Red Yeast Rice Extract 600 MG CAPS, Take 600 mg by mouth 2 (two) times daily., Disp: , Rfl:    traMADol (ULTRAM) 50  MG tablet, Take 50 mg by mouth every 12 (twelve) hours as needed for moderate pain., Disp: , Rfl:    vitamin E 180 MG (400 UNITS) capsule, Take 400 Units by mouth daily., Disp: , Rfl:    Allergies  Allergen Reactions   Latex Rash   Ace Inhibitors     Other reaction(s): cough   Amoxicillin     Other reaction(s): rash   Atorvastatin     Other reaction(s): aches Other reaction(s): aches   Chlorthalidone     Other reaction(s): elevated creatanine Other reaction(s): elevated creatanine   Ciprofloxacin     Other reaction(s): nausea (05/2013)   Fosamax [Alendronate]     Other reaction(s): leg pain   Other     Other reaction(s): cough Other reaction(s): aches Other reaction(s): leg pain Other reaction(s): nausea (05/2013)   Pravastatin     Other reaction(s): aches Other reaction(s): aches   Penicillins Rash    DID THE REACTION INVOLVE: Swelling of the face/tongue/throat, SOB, or low BP? No Sudden or severe rash/hives, skin peeling, or the inside of the mouth or nose? No Did it require medical treatment? No When did it last happen?       If all above answers are "NO", may proceed with cephalosporin use.    Past Medical History:  Diagnosis Date   Acute on chronic left systolic heart failure ([place]) 10/26/2019   Anemia    Anxiety    Arthritis    Chronic kidney disease    Drug-induced myopathy 04/17/2021   Dyspnea    Headache    Hypertension    Hypothyroidism      Past Surgical History:  Procedure Laterality Date   APPENDECTOMY     BREAST BIOPSY Left 2015   benign   EYE SURGERY     TONSILLECTOMY      Family History  Problem Relation Age of Onset   Breast cancer Neg Hx     Social History   Tobacco Use   Smoking status: Never   Smokeless tobacco: Never  Vaping Use   Vaping Use: Never used  Substance Use Topics   Alcohol use: Never   Drug use: Never    ROS   Objective:   Vitals: BP (!) 191/80 (BP Location: Left Arm)   Pulse 64   Temp 98 F (36.7 C)  (Oral)   Resp 16   SpO2 94%   BP recheck was 186/71.  Physical Exam Constitutional:      General: She is not in acute distress.    Appearance: Normal appearance. She is well-developed and normal weight. She is not ill-appearing, toxic-appearing or diaphoretic.  HENT:  Head: Normocephalic and atraumatic.     Right Ear: External ear normal.     Left Ear: External ear normal.     Nose: Nose normal.     Mouth/Throat:     Mouth: Mucous membranes are moist.     Pharynx: Oropharynx is clear.  Eyes:     General: No scleral icterus.    Extraocular Movements: Extraocular movements intact.     Pupils: Pupils are equal, round, and reactive to light.  Cardiovascular:     Rate and Rhythm: Normal rate and regular rhythm.     Pulses: Normal pulses.     Heart sounds: Normal heart sounds. No murmur heard.   No friction rub. No gallop.  Pulmonary:     Effort: Pulmonary effort is normal. No respiratory distress.     Breath sounds: Normal breath sounds. No stridor. No wheezing, rhonchi or rales.  Abdominal:     General: Bowel sounds are normal. There is no distension.     Palpations: Abdomen is soft. There is no mass.     Tenderness: There is abdominal tenderness (mild) in the left lower quadrant. There is no right CVA tenderness, left CVA tenderness, guarding or rebound.  Skin:    General: Skin is warm and dry.     Coloration: Skin is not pale.     Findings: No rash.  Neurological:     General: No focal deficit present.     Mental Status: She is alert and oriented to person, place, and time.     Cranial Nerves: No cranial nerve deficit.     Coordination: Coordination abnormal (ambulates with a walker).     Comments: Negative pronator drift.   Psychiatric:        Mood and Affect: Mood normal.        Behavior: Behavior normal.        Thought Content: Thought content normal.        Judgment: Judgment normal.   Patient unable to provide urine sample.  Assessment and Plan :   PDMP not  reviewed this encounter.  1. Left lower quadrant abdominal pain   2. History of diverticulosis     Patient has benign abdominal exam.  Her pain level is very low, discussed that my primary concern would be if she develops diverticulitis given her history of diffuse diverticulosis.  At this point without severe abdominal pain, fever, bloody stools I am not inclined to send the patient to the emergency room for CT scan.  Both the patient and her granddaughter are in agreement.  Recommended continued use of her Benefiber, very close follow-up with her PCP and maintaining strict ER precautions.  Use Tylenol for abdominal pain.  No signs of an acute encephalopathy on exam.   Jaynee Eagles, PA-C 06/18/21 1104

## 2021-06-18 NOTE — Discharge Instructions (Addendum)
Please look out for worsening abdominal pain, bloody stools, fever, abdominal pain elicited with walking or bumpy car ride. These are signs of an emergent problem like diverticulitis. Otherwise, use Tylenol for pain 500mg -650mg  once every 6 hours. Follow up with your regular doctor as soon as possible.

## 2021-06-18 NOTE — ED Notes (Signed)
Tried to get urine sample, patient was unable to provide

## 2021-06-18 NOTE — ED Triage Notes (Signed)
Bilateral lower quadrant abdominal pain beginning yesterday morning. Tried prune juice and benefiber, had 1 loose stool yesterday and pain was relieved. Woke up this morning with pain still present/worsening. Hurts to sit down. Denies nausea, vomiting, fever. Has not taken morning meds

## 2021-06-19 DIAGNOSIS — N184 Chronic kidney disease, stage 4 (severe): Secondary | ICD-10-CM | POA: Diagnosis not present

## 2021-06-19 DIAGNOSIS — I509 Heart failure, unspecified: Secondary | ICD-10-CM | POA: Diagnosis not present

## 2021-06-19 DIAGNOSIS — D631 Anemia in chronic kidney disease: Secondary | ICD-10-CM | POA: Diagnosis not present

## 2021-06-19 DIAGNOSIS — I130 Hypertensive heart and chronic kidney disease with heart failure and stage 1 through stage 4 chronic kidney disease, or unspecified chronic kidney disease: Secondary | ICD-10-CM | POA: Diagnosis not present

## 2021-06-19 DIAGNOSIS — R69 Illness, unspecified: Secondary | ICD-10-CM | POA: Diagnosis not present

## 2021-06-20 ENCOUNTER — Other Ambulatory Visit: Payer: Self-pay

## 2021-06-20 ENCOUNTER — Encounter (HOSPITAL_COMMUNITY)
Admission: RE | Admit: 2021-06-20 | Discharge: 2021-06-20 | Disposition: A | Discharge status: Home / Self Care | Payer: Medicare HMO | Source: Ambulatory Visit | Attending: Nephrology | Admitting: Nephrology

## 2021-06-20 VITALS — BP 149/52 | HR 66 | Temp 97.2°F | Resp 20

## 2021-06-20 DIAGNOSIS — N184 Chronic kidney disease, stage 4 (severe): Secondary | ICD-10-CM

## 2021-06-20 DIAGNOSIS — D631 Anemia in chronic kidney disease: Secondary | ICD-10-CM

## 2021-06-20 LAB — FERRITIN: Ferritin: 93 ng/mL (ref 11–307)

## 2021-06-20 LAB — IRON AND TIBC
Iron: 24 ug/dL — ABNORMAL LOW (ref 28–170)
Saturation Ratios: 8 % — ABNORMAL LOW (ref 10.4–31.8)
TIBC: 287 ug/dL (ref 250–450)
UIBC: 263 ug/dL

## 2021-06-20 LAB — POCT HEMOGLOBIN-HEMACUE: Hemoglobin: 13.1 g/dL (ref 12.0–15.0)

## 2021-06-20 MED ORDER — EPOETIN ALFA 20000 UNIT/ML IJ SOLN: 20000.0000 [IU] | INTRAMUSCULAR | Status: DC

## 2021-06-23 ENCOUNTER — Encounter: Payer: Self-pay | Admitting: Podiatry

## 2021-06-23 ENCOUNTER — Other Ambulatory Visit: Payer: Self-pay

## 2021-06-23 ENCOUNTER — Ambulatory Visit: Payer: Medicare HMO | Admitting: Podiatry

## 2021-06-23 DIAGNOSIS — B351 Tinea unguium: Secondary | ICD-10-CM | POA: Diagnosis not present

## 2021-06-23 DIAGNOSIS — M79675 Pain in left toe(s): Secondary | ICD-10-CM | POA: Diagnosis not present

## 2021-06-23 DIAGNOSIS — M79674 Pain in right toe(s): Secondary | ICD-10-CM

## 2021-06-23 DIAGNOSIS — I130 Hypertensive heart and chronic kidney disease with heart failure and stage 1 through stage 4 chronic kidney disease, or unspecified chronic kidney disease: Secondary | ICD-10-CM | POA: Diagnosis not present

## 2021-06-23 DIAGNOSIS — N184 Chronic kidney disease, stage 4 (severe): Secondary | ICD-10-CM | POA: Diagnosis not present

## 2021-06-23 DIAGNOSIS — R69 Illness, unspecified: Secondary | ICD-10-CM | POA: Diagnosis not present

## 2021-06-23 DIAGNOSIS — I509 Heart failure, unspecified: Secondary | ICD-10-CM | POA: Diagnosis not present

## 2021-06-23 DIAGNOSIS — D631 Anemia in chronic kidney disease: Secondary | ICD-10-CM | POA: Diagnosis not present

## 2021-06-23 NOTE — Progress Notes (Signed)
Subjective: Alicia Sellers is a pleasant 87 y.o. female patient seen today for at risk foot care with h/o stage 4 CKD and painful thick toenails that are difficult to trim. Pain interferes with ambulation. Aggravating factors include wearing enclosed shoe gear. Pain is relieved with periodic professional debridement.  PCP is Gaynelle Arabian, MD. Last visit was: 05/01/2021.  Allergies  Allergen Reactions   Latex Rash   Ace Inhibitors     Other reaction(s): cough   Amoxicillin     Other reaction(s): rash   Atorvastatin     Other reaction(s): aches Other reaction(s): aches   Chlorthalidone     Other reaction(s): elevated creatanine Other reaction(s): elevated creatanine   Ciprofloxacin     Other reaction(s): nausea (05/2013)   Fosamax [Alendronate]     Other reaction(s): leg pain   Other     Other reaction(s): cough Other reaction(s): aches Other reaction(s): leg pain Other reaction(s): nausea (05/2013)   Pravastatin     Other reaction(s): aches Other reaction(s): aches   Penicillins Rash    DID THE REACTION INVOLVE: Swelling of the face/tongue/throat, SOB, or low BP? No Sudden or severe rash/hives, skin peeling, or the inside of the mouth or nose? No Did it require medical treatment? No When did it last happen?       If all above answers are "NO", may proceed with cephalosporin use.    Objective: Physical Exam  General: Alicia Sellers is a pleasant 87 y.o. Caucasian female, WD, WN in NAD. AAO x 3.   Vascular:  Capillary fill time to digits <3 seconds b/l lower extremities. Palpable DP pulse(s) b/l lower extremities Palpable PT pulse(s) b/l lower extremities Pedal hair sparse. Lower extremity skin temperature gradient within normal limits. No pain with calf compression b/l.  Dermatological:  Pedal skin with normal turgor, texture and tone b/l lower extremities. No open wounds b/l lower extremities. No interdigital macerations b/l lower extremities. Toenails 1-5 b/l  elongated, discolored, dystrophic, thickened, crumbly with subungual debris and tenderness to dorsal palpation. Evidence of permanent partial nail avulsion of lateral border of the left hallux.  Musculoskeletal:  Normal muscle strength 5/5 to all lower extremity muscle groups bilaterally. No pain crepitus or joint limitation noted with ROM b/l. No gross bony deformities bilaterally.  Neurological:  Protective sensation intact 5/5 intact bilaterally with 10g monofilament b/l.  Assessment and Plan:  1. Pain due to onychomycosis of toenails of both feet     -No new findings. No new orders. -Patient to continue soft, supportive shoe gear daily. -Toenails 1-5 b/l were debrided in length and girth with sterile nail nippers and dremel without iatrogenic bleeding.  -Patient to report any pedal injuries to medical professional immediately. -Patient/POA to call should there be question/concern in the interim.  Return in about 3 months (around 09/23/2021).  Marzetta Board, DPM

## 2021-06-26 DIAGNOSIS — I130 Hypertensive heart and chronic kidney disease with heart failure and stage 1 through stage 4 chronic kidney disease, or unspecified chronic kidney disease: Secondary | ICD-10-CM | POA: Diagnosis not present

## 2021-06-26 DIAGNOSIS — I509 Heart failure, unspecified: Secondary | ICD-10-CM | POA: Diagnosis not present

## 2021-06-26 DIAGNOSIS — D631 Anemia in chronic kidney disease: Secondary | ICD-10-CM | POA: Diagnosis not present

## 2021-06-26 DIAGNOSIS — N184 Chronic kidney disease, stage 4 (severe): Secondary | ICD-10-CM | POA: Diagnosis not present

## 2021-06-26 DIAGNOSIS — R69 Illness, unspecified: Secondary | ICD-10-CM | POA: Diagnosis not present

## 2021-06-27 ENCOUNTER — Encounter (HOSPITAL_COMMUNITY): Payer: Medicare HMO

## 2021-06-30 DIAGNOSIS — R69 Illness, unspecified: Secondary | ICD-10-CM | POA: Diagnosis not present

## 2021-06-30 DIAGNOSIS — I509 Heart failure, unspecified: Secondary | ICD-10-CM | POA: Diagnosis not present

## 2021-06-30 DIAGNOSIS — D631 Anemia in chronic kidney disease: Secondary | ICD-10-CM | POA: Diagnosis not present

## 2021-06-30 DIAGNOSIS — I130 Hypertensive heart and chronic kidney disease with heart failure and stage 1 through stage 4 chronic kidney disease, or unspecified chronic kidney disease: Secondary | ICD-10-CM | POA: Diagnosis not present

## 2021-06-30 DIAGNOSIS — N184 Chronic kidney disease, stage 4 (severe): Secondary | ICD-10-CM | POA: Diagnosis not present

## 2021-07-03 DIAGNOSIS — I130 Hypertensive heart and chronic kidney disease with heart failure and stage 1 through stage 4 chronic kidney disease, or unspecified chronic kidney disease: Secondary | ICD-10-CM | POA: Diagnosis not present

## 2021-07-03 DIAGNOSIS — I509 Heart failure, unspecified: Secondary | ICD-10-CM | POA: Diagnosis not present

## 2021-07-03 DIAGNOSIS — R69 Illness, unspecified: Secondary | ICD-10-CM | POA: Diagnosis not present

## 2021-07-03 DIAGNOSIS — D631 Anemia in chronic kidney disease: Secondary | ICD-10-CM | POA: Diagnosis not present

## 2021-07-03 DIAGNOSIS — N184 Chronic kidney disease, stage 4 (severe): Secondary | ICD-10-CM | POA: Diagnosis not present

## 2021-07-04 ENCOUNTER — Encounter (HOSPITAL_COMMUNITY)
Admission: RE | Admit: 2021-07-04 | Discharge: 2021-07-04 | Disposition: A | Discharge status: Home / Self Care | Payer: Medicare HMO | Source: Ambulatory Visit | Attending: Nephrology | Admitting: Nephrology

## 2021-07-04 ENCOUNTER — Other Ambulatory Visit: Payer: Self-pay

## 2021-07-04 VITALS — BP 169/59 | HR 60 | Resp 20

## 2021-07-04 DIAGNOSIS — D631 Anemia in chronic kidney disease: Secondary | ICD-10-CM | POA: Diagnosis not present

## 2021-07-04 DIAGNOSIS — N184 Chronic kidney disease, stage 4 (severe): Secondary | ICD-10-CM

## 2021-07-04 LAB — RENAL FUNCTION PANEL
Albumin: 2.8 g/dL — ABNORMAL LOW (ref 3.5–5.0)
Anion gap: 12 (ref 5–15)
BUN: 79 mg/dL — ABNORMAL HIGH (ref 8–23)
CO2: 25 mmol/L (ref 22–32)
Calcium: 9.2 mg/dL (ref 8.9–10.3)
Chloride: 100 mmol/L (ref 98–111)
Creatinine, Ser: 4.73 mg/dL — ABNORMAL HIGH (ref 0.44–1.00)
GFR, Estimated: 8 mL/min — ABNORMAL LOW (ref >60)
Glucose, Bld: 122 mg/dL — ABNORMAL HIGH (ref 70–99)
Phosphorus: 4.8 mg/dL — ABNORMAL HIGH (ref 2.5–4.6)
Potassium: 4.5 mmol/L (ref 3.5–5.1)
Sodium: 137 mmol/L (ref 135–145)

## 2021-07-04 LAB — POCT HEMOGLOBIN-HEMACUE: Hemoglobin: 11.3 g/dL — ABNORMAL LOW (ref 12.0–15.0)

## 2021-07-04 MED ORDER — EPOETIN ALFA 20000 UNIT/ML IJ SOLN
INTRAMUSCULAR | Status: AC
  Administered 2021-07-04: 20000 [IU] via SUBCUTANEOUS
  Filled 2021-07-04: qty 1

## 2021-07-04 MED ORDER — EPOETIN ALFA 20000 UNIT/ML IJ SOLN: 20000.0000 [IU] | INTRAMUSCULAR | Status: DC

## 2021-07-05 LAB — PTH, INTACT AND CALCIUM
Calcium, Total (PTH): 9 mg/dL (ref 8.7–10.3)
PTH: 28 pg/mL (ref 15–65)

## 2021-07-07 DIAGNOSIS — N2581 Secondary hyperparathyroidism of renal origin: Secondary | ICD-10-CM | POA: Diagnosis not present

## 2021-07-07 DIAGNOSIS — D519 Vitamin B12 deficiency anemia, unspecified: Secondary | ICD-10-CM | POA: Diagnosis not present

## 2021-07-07 DIAGNOSIS — N185 Chronic kidney disease, stage 5: Secondary | ICD-10-CM | POA: Diagnosis not present

## 2021-07-07 DIAGNOSIS — E785 Hyperlipidemia, unspecified: Secondary | ICD-10-CM | POA: Diagnosis not present

## 2021-07-07 DIAGNOSIS — I120 Hypertensive chronic kidney disease with stage 5 chronic kidney disease or end stage renal disease: Secondary | ICD-10-CM | POA: Diagnosis not present

## 2021-07-07 DIAGNOSIS — E039 Hypothyroidism, unspecified: Secondary | ICD-10-CM | POA: Diagnosis not present

## 2021-07-08 DIAGNOSIS — D631 Anemia in chronic kidney disease: Secondary | ICD-10-CM | POA: Diagnosis not present

## 2021-07-08 DIAGNOSIS — N184 Chronic kidney disease, stage 4 (severe): Secondary | ICD-10-CM | POA: Diagnosis not present

## 2021-07-08 DIAGNOSIS — I509 Heart failure, unspecified: Secondary | ICD-10-CM | POA: Diagnosis not present

## 2021-07-08 DIAGNOSIS — R69 Illness, unspecified: Secondary | ICD-10-CM | POA: Diagnosis not present

## 2021-07-08 DIAGNOSIS — I130 Hypertensive heart and chronic kidney disease with heart failure and stage 1 through stage 4 chronic kidney disease, or unspecified chronic kidney disease: Secondary | ICD-10-CM | POA: Diagnosis not present

## 2021-07-11 ENCOUNTER — Encounter (HOSPITAL_COMMUNITY)
Admission: RE | Admit: 2021-07-11 | Discharge: 2021-07-11 | Disposition: A | Discharge status: Home / Self Care | Payer: Medicare HMO | Source: Ambulatory Visit | Attending: Nephrology | Admitting: Nephrology

## 2021-07-11 ENCOUNTER — Other Ambulatory Visit: Payer: Self-pay

## 2021-07-11 VITALS — BP 163/57 | HR 60 | Temp 97.5°F | Resp 20

## 2021-07-11 DIAGNOSIS — N184 Chronic kidney disease, stage 4 (severe): Secondary | ICD-10-CM | POA: Diagnosis present

## 2021-07-11 DIAGNOSIS — D631 Anemia in chronic kidney disease: Secondary | ICD-10-CM | POA: Insufficient documentation

## 2021-07-11 LAB — POCT HEMOGLOBIN-HEMACUE: Hemoglobin: 10.7 g/dL — ABNORMAL LOW (ref 12.0–15.0)

## 2021-07-11 MED ORDER — EPOETIN ALFA 20000 UNIT/ML IJ SOLN
INTRAMUSCULAR | Status: AC
  Filled 2021-07-11: qty 1

## 2021-07-11 MED ORDER — EPOETIN ALFA 20000 UNIT/ML IJ SOLN
20000.0000 [IU] | INTRAMUSCULAR | Status: DC
  Administered 2021-07-11: 20000 [IU] via SUBCUTANEOUS

## 2021-07-14 ENCOUNTER — Other Ambulatory Visit: Payer: Self-pay | Admitting: Internal Medicine

## 2021-07-14 ENCOUNTER — Ambulatory Visit
Admission: RE | Admit: 2021-07-14 | Discharge: 2021-07-14 | Disposition: A | Discharge status: Home / Self Care | Payer: Medicare HMO | Source: Ambulatory Visit | Attending: Internal Medicine | Admitting: Internal Medicine

## 2021-07-14 DIAGNOSIS — R0602 Shortness of breath: Secondary | ICD-10-CM

## 2021-07-18 ENCOUNTER — Other Ambulatory Visit: Payer: Self-pay

## 2021-07-18 ENCOUNTER — Encounter (HOSPITAL_COMMUNITY)
Admission: RE | Admit: 2021-07-18 | Discharge: 2021-07-18 | Disposition: A | Discharge status: Home / Self Care | Payer: Medicare HMO | Source: Ambulatory Visit | Attending: Nephrology | Admitting: Nephrology

## 2021-07-18 VITALS — BP 152/59 | HR 67 | Resp 20

## 2021-07-18 DIAGNOSIS — D631 Anemia in chronic kidney disease: Secondary | ICD-10-CM

## 2021-07-18 DIAGNOSIS — N184 Chronic kidney disease, stage 4 (severe): Secondary | ICD-10-CM | POA: Diagnosis not present

## 2021-07-18 LAB — IRON AND TIBC
Iron: 63 ug/dL (ref 28–170)
Saturation Ratios: 21 % (ref 10.4–31.8)
TIBC: 300 ug/dL (ref 250–450)
UIBC: 237 ug/dL

## 2021-07-18 LAB — FERRITIN: Ferritin: 145 ng/mL (ref 11–307)

## 2021-07-18 LAB — POCT HEMOGLOBIN-HEMACUE: Hemoglobin: 10.3 g/dL — ABNORMAL LOW (ref 12.0–15.0)

## 2021-07-18 MED ORDER — EPOETIN ALFA 20000 UNIT/ML IJ SOLN
INTRAMUSCULAR | Status: AC
  Administered 2021-07-18: 20000 [IU] via SUBCUTANEOUS
  Filled 2021-07-18: qty 1

## 2021-07-18 MED ORDER — EPOETIN ALFA 20000 UNIT/ML IJ SOLN: 20000.0000 [IU] | INTRAMUSCULAR | Status: DC

## 2021-07-21 DIAGNOSIS — D631 Anemia in chronic kidney disease: Secondary | ICD-10-CM | POA: Diagnosis not present

## 2021-07-21 DIAGNOSIS — E039 Hypothyroidism, unspecified: Secondary | ICD-10-CM | POA: Diagnosis not present

## 2021-07-21 DIAGNOSIS — N184 Chronic kidney disease, stage 4 (severe): Secondary | ICD-10-CM | POA: Diagnosis not present

## 2021-07-21 DIAGNOSIS — M1990 Unspecified osteoarthritis, unspecified site: Secondary | ICD-10-CM | POA: Diagnosis not present

## 2021-07-21 DIAGNOSIS — R69 Illness, unspecified: Secondary | ICD-10-CM | POA: Diagnosis not present

## 2021-07-21 DIAGNOSIS — E7800 Pure hypercholesterolemia, unspecified: Secondary | ICD-10-CM | POA: Diagnosis not present

## 2021-07-21 DIAGNOSIS — I509 Heart failure, unspecified: Secondary | ICD-10-CM | POA: Diagnosis not present

## 2021-07-21 DIAGNOSIS — I130 Hypertensive heart and chronic kidney disease with heart failure and stage 1 through stage 4 chronic kidney disease, or unspecified chronic kidney disease: Secondary | ICD-10-CM | POA: Diagnosis not present

## 2021-07-21 DIAGNOSIS — M810 Age-related osteoporosis without current pathological fracture: Secondary | ICD-10-CM | POA: Diagnosis not present

## 2021-07-21 DIAGNOSIS — I129 Hypertensive chronic kidney disease with stage 1 through stage 4 chronic kidney disease, or unspecified chronic kidney disease: Secondary | ICD-10-CM | POA: Diagnosis not present

## 2021-07-22 DIAGNOSIS — N184 Chronic kidney disease, stage 4 (severe): Secondary | ICD-10-CM | POA: Diagnosis not present

## 2021-07-22 DIAGNOSIS — I2510 Atherosclerotic heart disease of native coronary artery without angina pectoris: Secondary | ICD-10-CM | POA: Diagnosis not present

## 2021-07-22 DIAGNOSIS — I5022 Chronic systolic (congestive) heart failure: Secondary | ICD-10-CM | POA: Diagnosis not present

## 2021-07-22 DIAGNOSIS — I10 Essential (primary) hypertension: Secondary | ICD-10-CM | POA: Diagnosis not present

## 2021-07-25 ENCOUNTER — Encounter (HOSPITAL_COMMUNITY)
Admission: RE | Admit: 2021-07-25 | Discharge: 2021-07-25 | Disposition: A | Discharge status: Home / Self Care | Payer: Medicare HMO | Source: Ambulatory Visit | Attending: Nephrology | Admitting: Nephrology

## 2021-07-25 ENCOUNTER — Other Ambulatory Visit: Payer: Self-pay

## 2021-07-25 VITALS — BP 158/57 | HR 65 | Temp 97.7°F | Resp 20

## 2021-07-25 DIAGNOSIS — D631 Anemia in chronic kidney disease: Secondary | ICD-10-CM

## 2021-07-25 DIAGNOSIS — N184 Chronic kidney disease, stage 4 (severe): Secondary | ICD-10-CM | POA: Diagnosis not present

## 2021-07-25 LAB — POCT HEMOGLOBIN-HEMACUE: Hemoglobin: 11.3 g/dL — ABNORMAL LOW (ref 12.0–15.0)

## 2021-07-25 MED ORDER — EPOETIN ALFA 20000 UNIT/ML IJ SOLN
INTRAMUSCULAR | Status: AC
  Filled 2021-07-25: qty 1

## 2021-07-25 MED ORDER — EPOETIN ALFA 20000 UNIT/ML IJ SOLN
20000.0000 [IU] | INTRAMUSCULAR | Status: DC
  Administered 2021-07-25: 20000 [IU] via SUBCUTANEOUS

## 2021-08-01 ENCOUNTER — Encounter (HOSPITAL_COMMUNITY)
Admission: RE | Admit: 2021-08-01 | Discharge: 2021-08-01 | Disposition: A | Discharge status: Home / Self Care | Payer: Medicare HMO | Source: Ambulatory Visit | Attending: Nephrology | Admitting: Nephrology

## 2021-08-01 ENCOUNTER — Other Ambulatory Visit: Payer: Self-pay

## 2021-08-01 VITALS — BP 148/57 | HR 55 | Temp 97.6°F | Resp 20

## 2021-08-01 DIAGNOSIS — D631 Anemia in chronic kidney disease: Secondary | ICD-10-CM

## 2021-08-01 DIAGNOSIS — N184 Chronic kidney disease, stage 4 (severe): Secondary | ICD-10-CM

## 2021-08-01 LAB — POCT HEMOGLOBIN-HEMACUE: Hemoglobin: 11.5 g/dL — ABNORMAL LOW (ref 12.0–15.0)

## 2021-08-01 MED ORDER — EPOETIN ALFA 20000 UNIT/ML IJ SOLN
20000.0000 [IU] | INTRAMUSCULAR | Status: DC
  Administered 2021-08-01: 20000 [IU] via SUBCUTANEOUS

## 2021-08-01 MED ORDER — EPOETIN ALFA 20000 UNIT/ML IJ SOLN
INTRAMUSCULAR | Status: AC
  Filled 2021-08-01: qty 1

## 2021-08-06 ENCOUNTER — Other Ambulatory Visit: Payer: Self-pay | Admitting: *Deleted

## 2021-08-06 NOTE — Patient Outreach (Signed)
[place] [facility]) Care Management  08/06/2021  Alicia Sellers 01/10/1929 [id number]   [facility] unsuccessful follow up attempt for post hospital/complex care patient   Alicia Sellers was referred to [facility] on 03/25/21 by [facility] hospital liaison for post hospital stay/complex care services     Insurance Aetna Medicare   Last admission: 03/22/21-03/24/21 Acute on chronic systolic heart failure, Dyspnea, Chronic Kidney disease (CKD) stage 5   Last successful outreach on 06/05/21   Outreach to [phone]  Alicia Sellers's daughter in law, Alicia Sellers answered and reported Alicia Sellers was napping. Daughter in law reported she was doing okay, denies concerns.  RN CM left a HIPAA compliant message for patient   With brief review of EPIC encounters/notes, RN CM notes pt with 06/18/21 ED visit for bilateral lower quadrant abdominal pain with a history of diverticulosis She also had a visit on 06/23/21 to her podiatrist, Alicia Sellers for bilateral debridement of toenails for pain due to onychomycosis [facility] Chronic Kidney disease (CKD) orderd by Alicia Sellers  Plans RN CM will follow up within the next 7-10 business days, if no return call from patient Sent EMMI education via her documented e-mail address  Information sent on Chronic Kidney disease (CKD), diverticulosis and high fiber diet   Anmol Paschen L. Lavina Hamman, RN, BSN, [place] Coordinator Office number [phone] Main [facility] number [phone] Fax number [phone]

## 2021-08-08 ENCOUNTER — Encounter (HOSPITAL_COMMUNITY)
Admission: RE | Admit: 2021-08-08 | Discharge: 2021-08-08 | Disposition: A | Discharge status: Home / Self Care | Payer: Medicare HMO | Source: Ambulatory Visit | Attending: Nephrology | Admitting: Nephrology

## 2021-08-08 ENCOUNTER — Other Ambulatory Visit: Payer: Self-pay

## 2021-08-08 VITALS — BP 158/62 | HR 57 | Temp 98.2°F | Resp 19

## 2021-08-08 DIAGNOSIS — N184 Chronic kidney disease, stage 4 (severe): Secondary | ICD-10-CM | POA: Insufficient documentation

## 2021-08-08 DIAGNOSIS — D631 Anemia in chronic kidney disease: Secondary | ICD-10-CM | POA: Insufficient documentation

## 2021-08-08 LAB — POCT HEMOGLOBIN-HEMACUE: Hemoglobin: 12.2 g/dL (ref 12.0–15.0)

## 2021-08-08 MED ORDER — EPOETIN ALFA 20000 UNIT/ML IJ SOLN: 20000.0000 [IU] | INTRAMUSCULAR | Status: DC

## 2021-08-15 ENCOUNTER — Other Ambulatory Visit: Payer: Self-pay | Admitting: *Deleted

## 2021-08-15 ENCOUNTER — Encounter (HOSPITAL_COMMUNITY): Payer: Medicare HMO

## 2021-08-15 DIAGNOSIS — Z9889 Other specified postprocedural states: Secondary | ICD-10-CM | POA: Diagnosis not present

## 2021-08-15 DIAGNOSIS — H6122 Impacted cerumen, left ear: Secondary | ICD-10-CM | POA: Diagnosis not present

## 2021-08-15 NOTE — Patient Outreach (Signed)
[facility]) Care Management  08/15/2021  Quenisha Lovins 1929-08-25 [id number]   [facility] Care coordination  Outreach from [place] of patient's daughter call to [facility] main line to cancel 08/15/21 1600 appointment  Left a message for pt/daughter requesting a return call to reschedule as requested    Plan [facility] RN CM will follow up with patient within the 14-21 business days   Poet Hineman L. Lavina Hamman, RN, BSN, [place] Coordinator Office number [phone] Mobile number [phone]  Main [facility] number [phone] Fax number [phone]

## 2021-08-18 ENCOUNTER — Encounter (HOSPITAL_COMMUNITY): Payer: Self-pay

## 2021-08-22 ENCOUNTER — Encounter (HOSPITAL_COMMUNITY)
Admission: RE | Admit: 2021-08-22 | Discharge: 2021-08-22 | Disposition: A | Discharge status: Home / Self Care | Payer: Medicare HMO | Source: Ambulatory Visit | Attending: Nephrology | Admitting: Nephrology

## 2021-08-22 VITALS — BP 139/71 | HR 60 | Temp 98.2°F | Resp 18

## 2021-08-22 DIAGNOSIS — D631 Anemia in chronic kidney disease: Secondary | ICD-10-CM | POA: Diagnosis not present

## 2021-08-22 DIAGNOSIS — N184 Chronic kidney disease, stage 4 (severe): Secondary | ICD-10-CM | POA: Diagnosis not present

## 2021-08-22 LAB — IRON AND TIBC
Iron: 130 ug/dL (ref 28–170)
Saturation Ratios: 44 % — ABNORMAL HIGH (ref 10.4–31.8)
TIBC: 298 ug/dL (ref 250–450)
UIBC: 168 ug/dL

## 2021-08-22 LAB — FERRITIN: Ferritin: 217 ng/mL (ref 11–307)

## 2021-08-22 LAB — POCT HEMOGLOBIN-HEMACUE: Hemoglobin: 10.7 g/dL — ABNORMAL LOW (ref 12.0–15.0)

## 2021-08-22 MED ORDER — EPOETIN ALFA 20000 UNIT/ML IJ SOLN
20000.0000 [IU] | INTRAMUSCULAR | Status: DC
  Administered 2021-08-22: 20000 [IU] via SUBCUTANEOUS

## 2021-08-22 MED ORDER — EPOETIN ALFA 20000 UNIT/ML IJ SOLN
INTRAMUSCULAR | Status: AC
  Filled 2021-08-22: qty 1

## 2021-08-29 ENCOUNTER — Other Ambulatory Visit: Payer: Self-pay | Admitting: *Deleted

## 2021-08-29 ENCOUNTER — Other Ambulatory Visit: Payer: Self-pay

## 2021-08-29 ENCOUNTER — Encounter (HOSPITAL_COMMUNITY)
Admission: RE | Admit: 2021-08-29 | Discharge: 2021-08-29 | Disposition: A | Discharge status: Home / Self Care | Payer: Medicare HMO | Source: Ambulatory Visit | Attending: Nephrology | Admitting: Nephrology

## 2021-08-29 VITALS — BP 153/54 | HR 60 | Temp 97.2°F | Resp 20

## 2021-08-29 DIAGNOSIS — D631 Anemia in chronic kidney disease: Secondary | ICD-10-CM

## 2021-08-29 DIAGNOSIS — N184 Chronic kidney disease, stage 4 (severe): Secondary | ICD-10-CM

## 2021-08-29 LAB — RENAL FUNCTION PANEL
Albumin: 3.1 g/dL — ABNORMAL LOW (ref 3.5–5.0)
Anion gap: 15 (ref 5–15)
BUN: 81 mg/dL — ABNORMAL HIGH (ref 8–23)
CO2: 24 mmol/L (ref 22–32)
Calcium: 9.7 mg/dL (ref 8.9–10.3)
Chloride: 99 mmol/L (ref 98–111)
Creatinine, Ser: 5.36 mg/dL — ABNORMAL HIGH (ref 0.44–1.00)
GFR, Estimated: 7 mL/min — ABNORMAL LOW (ref >60)
Glucose, Bld: 122 mg/dL — ABNORMAL HIGH (ref 70–99)
Phosphorus: 5.7 mg/dL — ABNORMAL HIGH (ref 2.5–4.6)
Potassium: 4.2 mmol/L (ref 3.5–5.1)
Sodium: 138 mmol/L (ref 135–145)

## 2021-08-29 MED ORDER — EPOETIN ALFA 20000 UNIT/ML IJ SOLN
INTRAMUSCULAR | Status: AC
  Filled 2021-08-29: qty 1

## 2021-08-29 MED ORDER — EPOETIN ALFA 20000 UNIT/ML IJ SOLN
20000.0000 [IU] | INTRAMUSCULAR | Status: DC
  Administered 2021-08-29: 20000 [IU] via SUBCUTANEOUS

## 2021-08-29 NOTE — Patient Outreach (Signed)
[place] [facility]) Care Management  08/29/2021  Alicia Sellers 04-Nov-1929 [id number]   [facility] Unsuccessful outreach   Outreach attempt to the home number [phone] No answer. [facility] RN CM left HIPAA [facility] Portability and Accountability Act) compliant voicemail message along with CM's contact info.   Plan: [facility] RN CM scheduled this patient for another call attempt within 4-7 business days Unsuccessful outreach letter sent on 08/29/21 Unsuccessful outreach on 08/15/21 08/29/21   Joelene Millin L. Lavina Hamman, RN, BSN, [place] Coordinator Office number [phone] Mobile number [phone]  Main [facility] number [phone] Fax number [phone]

## 2021-08-30 LAB — PTH, INTACT AND CALCIUM
Calcium, Total (PTH): 9.7 mg/dL (ref 8.7–10.3)
PTH: 33 pg/mL (ref 15–65)

## 2021-09-01 LAB — POCT HEMOGLOBIN-HEMACUE: Hemoglobin: 8.8 g/dL — ABNORMAL LOW (ref 12.0–15.0)

## 2021-09-03 ENCOUNTER — Other Ambulatory Visit: Payer: Self-pay | Admitting: *Deleted

## 2021-09-03 NOTE — Patient Outreach (Signed)
[facility]) Care Management  09/03/2021  [name] [birth year] [id number]   [facility]N third Unsuccessful outreach     Outreach attempt to the home number [phone] Her daughter [name] answered and reporting the patient was sleeping. [facility] RN CM left HIPAA [facility] Portability and Accountability Act) compliant voicemail message along with CM's contact info.   [name] shared with RN CM that she did not know if Alicia Sellers would be able to return RN CM outreach as she "is not doing well"  [name] reports the patient "stays tired", does have an appointment on 09/04/21 with Dr Justin Mend, nephrology but not sure she will be able to get out of the home to the appointment PMH of Chronic Kidney disease (CKD) stage 5/End Stage Renal Disease (ESRD)and anemia with outpatient treatments/Epogen injections  Hemoglobin 08/29/21 8.8,  08/22/21 10.7, 08/08/21 12.2 (range 12-15)  RN CM offered her empathy and offered to assist in anyway needed. Reviewed [facility] care coordination and disease management services  [name] voiced understanding and appreciation for the offer and outreach    Plan: [facility] RN CM scheduled this patient for case closure  Unsuccessful outreach letter sent on 08/29/21 Unsuccessful outreach on 08/15/21 08/29/21, 09/03/21     Joelene Millin L. Lavina Hamman, RN, BSN, [place] Coordinator Office number [phone] Mobile number [phone]  Main [facility] number [phone] Fax number [phone]

## 2021-09-04 DIAGNOSIS — E785 Hyperlipidemia, unspecified: Secondary | ICD-10-CM | POA: Diagnosis not present

## 2021-09-04 DIAGNOSIS — N2581 Secondary hyperparathyroidism of renal origin: Secondary | ICD-10-CM | POA: Diagnosis not present

## 2021-09-04 DIAGNOSIS — E039 Hypothyroidism, unspecified: Secondary | ICD-10-CM | POA: Diagnosis not present

## 2021-09-04 DIAGNOSIS — N185 Chronic kidney disease, stage 5: Secondary | ICD-10-CM | POA: Diagnosis not present

## 2021-09-04 DIAGNOSIS — I120 Hypertensive chronic kidney disease with stage 5 chronic kidney disease or end stage renal disease: Secondary | ICD-10-CM | POA: Diagnosis not present

## 2021-09-04 DIAGNOSIS — D519 Vitamin B12 deficiency anemia, unspecified: Secondary | ICD-10-CM | POA: Diagnosis not present

## 2021-09-05 ENCOUNTER — Encounter (HOSPITAL_COMMUNITY)
Admission: RE | Admit: 2021-09-05 | Discharge: 2021-09-05 | Disposition: A | Discharge status: Home / Self Care | Payer: Medicare HMO | Source: Ambulatory Visit | Attending: Nephrology | Admitting: Nephrology

## 2021-09-05 ENCOUNTER — Encounter (HOSPITAL_COMMUNITY): Payer: Medicare HMO

## 2021-09-05 ENCOUNTER — Other Ambulatory Visit: Payer: Self-pay

## 2021-09-05 VITALS — BP 144/52 | HR 61 | Temp 97.0°F | Resp 18

## 2021-09-05 DIAGNOSIS — N184 Chronic kidney disease, stage 4 (severe): Secondary | ICD-10-CM

## 2021-09-05 DIAGNOSIS — D631 Anemia in chronic kidney disease: Secondary | ICD-10-CM

## 2021-09-05 LAB — POCT HEMOGLOBIN-HEMACUE: Hemoglobin: 10.1 g/dL — ABNORMAL LOW (ref 12.0–15.0)

## 2021-09-05 MED ORDER — EPOETIN ALFA 20000 UNIT/ML IJ SOLN
INTRAMUSCULAR | Status: AC
  Filled 2021-09-05: qty 1

## 2021-09-05 MED ORDER — EPOETIN ALFA 20000 UNIT/ML IJ SOLN
20000.0000 [IU] | INTRAMUSCULAR | Status: DC
  Administered 2021-09-05: 20000 [IU] via SUBCUTANEOUS

## 2021-09-09 ENCOUNTER — Emergency Department (HOSPITAL_COMMUNITY): Payer: Medicare HMO

## 2021-09-09 ENCOUNTER — Other Ambulatory Visit: Payer: Self-pay

## 2021-09-09 ENCOUNTER — Emergency Department (HOSPITAL_COMMUNITY)
Admission: EM | Admit: 2021-09-09 | Discharge: 2021-09-09 | Disposition: A | Discharge status: Home / Self Care | Payer: Medicare HMO | Attending: Emergency Medicine | Admitting: Emergency Medicine

## 2021-09-09 ENCOUNTER — Encounter (HOSPITAL_COMMUNITY): Payer: Self-pay

## 2021-09-09 DIAGNOSIS — I10 Essential (primary) hypertension: Secondary | ICD-10-CM | POA: Diagnosis not present

## 2021-09-09 DIAGNOSIS — N184 Chronic kidney disease, stage 4 (severe): Secondary | ICD-10-CM | POA: Insufficient documentation

## 2021-09-09 DIAGNOSIS — E039 Hypothyroidism, unspecified: Secondary | ICD-10-CM | POA: Insufficient documentation

## 2021-09-09 DIAGNOSIS — I440 Atrioventricular block, first degree: Secondary | ICD-10-CM | POA: Diagnosis not present

## 2021-09-09 DIAGNOSIS — S0003XA Contusion of scalp, initial encounter: Secondary | ICD-10-CM | POA: Insufficient documentation

## 2021-09-09 DIAGNOSIS — I130 Hypertensive heart and chronic kidney disease with heart failure and stage 1 through stage 4 chronic kidney disease, or unspecified chronic kidney disease: Secondary | ICD-10-CM | POA: Diagnosis not present

## 2021-09-09 DIAGNOSIS — I444 Left anterior fascicular block: Secondary | ICD-10-CM | POA: Insufficient documentation

## 2021-09-09 DIAGNOSIS — I452 Bifascicular block: Secondary | ICD-10-CM | POA: Insufficient documentation

## 2021-09-09 DIAGNOSIS — T07XXXA Unspecified multiple injuries, initial encounter: Secondary | ICD-10-CM | POA: Diagnosis not present

## 2021-09-09 DIAGNOSIS — I110 Hypertensive heart disease with heart failure: Secondary | ICD-10-CM | POA: Diagnosis not present

## 2021-09-09 DIAGNOSIS — I5023 Acute on chronic systolic (congestive) heart failure: Secondary | ICD-10-CM | POA: Diagnosis not present

## 2021-09-09 DIAGNOSIS — I4510 Unspecified right bundle-branch block: Secondary | ICD-10-CM | POA: Diagnosis not present

## 2021-09-09 DIAGNOSIS — Z7982 Long term (current) use of aspirin: Secondary | ICD-10-CM | POA: Diagnosis not present

## 2021-09-09 DIAGNOSIS — Z23 Encounter for immunization: Secondary | ICD-10-CM | POA: Insufficient documentation

## 2021-09-09 DIAGNOSIS — Z91040 Latex allergy status: Secondary | ICD-10-CM | POA: Diagnosis not present

## 2021-09-09 DIAGNOSIS — Y92002 Bathroom of unspecified non-institutional (private) residence single-family (private) house as the place of occurrence of the external cause: Secondary | ICD-10-CM | POA: Diagnosis not present

## 2021-09-09 DIAGNOSIS — I509 Heart failure, unspecified: Secondary | ICD-10-CM

## 2021-09-09 DIAGNOSIS — W0110XA Fall on same level from slipping, tripping and stumbling with subsequent striking against unspecified object, initial encounter: Secondary | ICD-10-CM | POA: Diagnosis not present

## 2021-09-09 DIAGNOSIS — Z743 Need for continuous supervision: Secondary | ICD-10-CM | POA: Diagnosis not present

## 2021-09-09 DIAGNOSIS — S0990XA Unspecified injury of head, initial encounter: Secondary | ICD-10-CM | POA: Diagnosis not present

## 2021-09-09 DIAGNOSIS — Z79899 Other long term (current) drug therapy: Secondary | ICD-10-CM | POA: Diagnosis not present

## 2021-09-09 DIAGNOSIS — R0781 Pleurodynia: Secondary | ICD-10-CM | POA: Diagnosis not present

## 2021-09-09 DIAGNOSIS — R0902 Hypoxemia: Secondary | ICD-10-CM | POA: Diagnosis not present

## 2021-09-09 LAB — BASIC METABOLIC PANEL
Anion gap: 16 — ABNORMAL HIGH (ref 5–15)
BUN: 88 mg/dL — ABNORMAL HIGH (ref 8–23)
CO2: 22 mmol/L (ref 22–32)
Calcium: 9.3 mg/dL (ref 8.9–10.3)
Chloride: 101 mmol/L (ref 98–111)
Creatinine, Ser: 5.77 mg/dL — ABNORMAL HIGH (ref 0.44–1.00)
GFR, Estimated: 7 mL/min — ABNORMAL LOW (ref >60)
Glucose, Bld: 93 mg/dL (ref 70–99)
Potassium: 4.8 mmol/L (ref 3.5–5.1)
Sodium: 139 mmol/L (ref 135–145)

## 2021-09-09 LAB — BRAIN NATRIURETIC PEPTIDE: B Natriuretic Peptide: >4500 pg/mL — ABNORMAL HIGH (ref 0.0–100.0)

## 2021-09-09 LAB — CBC WITH DIFFERENTIAL/PLATELET
Abs Immature Granulocytes: 0.04 10*3/uL (ref 0.00–0.07)
Basophils Absolute: 0.1 10*3/uL (ref 0.0–0.1)
Basophils Relative: 1 %
Eosinophils Absolute: 0.1 10*3/uL (ref 0.0–0.5)
Eosinophils Relative: 2 %
HCT: 35.2 % — ABNORMAL LOW (ref 36.0–46.0)
Hemoglobin: 11 g/dL — ABNORMAL LOW (ref 12.0–15.0)
Immature Granulocytes: 1 %
Lymphocytes Relative: 7 %
Lymphs Abs: 0.5 10*3/uL — ABNORMAL LOW (ref 0.7–4.0)
MCH: 30.7 pg (ref 26.0–34.0)
MCHC: 31.3 g/dL (ref 30.0–36.0)
MCV: 98.3 fL (ref 80.0–100.0)
Monocytes Absolute: 0.4 10*3/uL (ref 0.1–1.0)
Monocytes Relative: 6 %
Neutro Abs: 6 10*3/uL (ref 1.7–7.7)
Neutrophils Relative %: 83 %
Platelets: 252 10*3/uL (ref 150–400)
RBC: 3.58 MIL/uL — ABNORMAL LOW (ref 3.87–5.11)
RDW: 18.6 % — ABNORMAL HIGH (ref 11.5–15.5)
WBC: 7.1 10*3/uL (ref 4.0–10.5)
nRBC: 0 % (ref 0.0–0.2)

## 2021-09-09 MED ORDER — TETANUS-DIPHTH-ACELL PERTUSSIS 5-2.5-18.5 LF-MCG/0.5 IM SUSY
0.5000 mL | PREFILLED_SYRINGE | Freq: Once | INTRAMUSCULAR | Status: AC
  Administered 2021-09-09: 0.5 mL via INTRAMUSCULAR
  Filled 2021-09-09: qty 0.5

## 2021-09-09 MED ORDER — ACETAMINOPHEN 500 MG PO TABS
500.0000 mg | ORAL_TABLET | Freq: Once | ORAL | Status: AC
  Administered 2021-09-09: 500 mg via ORAL
  Filled 2021-09-09: qty 1

## 2021-09-09 MED ORDER — ACETAMINOPHEN 325 MG PO TABS: 325.0000 mg | ORAL_TABLET | Freq: Once | ORAL | Status: DC

## 2021-09-09 NOTE — ED Provider Notes (Signed)
[facility] EMERGENCY DEPARTMENT Provider Note   CSN: [id number] Arrival date & time: 09/09/21  1355     History Chief Complaint  Patient presents with   Lytle Michaels    Alicia Sellers is a 87 y.o. female with a past medical history of chronic systolic heart failure, CKD 4, hypertension, hypothyroidism, who presents today after experiencing a fall where she struck her head.  Patient states that she was finishing up using the restroom and standing when she lost her balance resulting in a fall where she struck the back of her head.  Patient normally uses a walker for ambulation however this was left outside the restroom due to it not fitting.  She denies any dizziness however her daughter who is present reports that she has seemed weaker over the last several weeks to months.  Around one year ago, the patient had a fall comparable to this episode resulting from loss of balance.  Otherwise she denies major falls.  She also notes that the patient has been sleeping more and more.  They do raise concern regarding the patient feeling that her pants have been feeling more tightly lately with worsening of baseline shortness of breath.  She does have orthopnea at baseline and sleeps with her head elevated at night.     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 coli) ([place]) 05/08/2020   Transaminitis 76/28/3151   Chronic systolic CHF (congestive heart failure) ([place]) 05/08/2020   Sepsis ([place]) 05/08/2020   Acute on chronic left systolic heart failure ([place]) 10/26/2019   Otorrhea of left ear 76/16/0737   Metabolic acidosis, normal anion gap (NAG) 12/02/2018   Dyspnea 12/01/2018   HTN (hypertension) 12/01/2018   Chronic kidney disease, stage 4 (severe) ([place]) 12/01/2018   Hypothyroidism 12/01/2018   Anxiety 12/01/2018   Anemia due to chronic kidney disease 12/01/2018   Left chronic serous otitis media 03/31/2018   Presbycusis of both ears 03/17/2018   Arthritis 01/06/2012    Past Surgical History:  Procedure Laterality Date   APPENDECTOMY     BREAST BIOPSY Left 2015   benign   EYE SURGERY     TONSILLECTOMY       OB History   No obstetric history on file.     Family History  Problem Relation Age of Onset   Breast cancer Neg Hx     Social History   Tobacco Use   Smoking status: Never   Smokeless tobacco: Never  Vaping Use   Vaping Use: Never used  Substance Use Topics   Alcohol use: Never   Drug use: Never    Home Medications Prior to Admission medications   Medication Sig Start Date End Date Taking? Authorizing Provider  acetaminophen (TYLENOL) 500 MG tablet Take 1,000 mg by mouth every 6 (six) hours as needed for mild pain.    [provider]  amLODipine (NORVASC) 10 MG tablet Take 5 mg by mouth 2 (two) times daily. 10/30/19   [provider]  amLODipine (NORVASC) 5 MG tablet Take by mouth.    [provider]  aspirin 81 MG EC tablet Take 81 mg by mouth daily.     [provider]  ciprofloxacin (CIPRO) 500 MG tablet Take by mouth.    [provider]  citalopram (CELEXA) 10 MG tablet Take 10 mg by mouth daily.    [provider]  diclofenac Sodium (VOLTAREN) 1 % GEL Apply 2 g topically 4 (four) times daily. Patient taking differently: Apply 2 g topically 2 (two) times daily as needed (pain). 01/10/21   Wieters, Hallie C, PA-C  epoetin alfa (PROCRIT) 4000 UNIT/ML injection     [provider]  fexofenadine (ALLEGRA) 180 MG tablet Take 180 mg by mouth daily as needed for allergies.    [provider]  fluticasone (FLONASE) 50 MCG/ACT nasal spray Place 2 sprays into both nostrils daily as needed for allergies.    [provider]  furosemide (LASIX) 20 MG tablet Take 20 mg by mouth daily. 05/11/20   [provider]  furosemide (LASIX) 40 MG tablet Take 40 mg by mouth daily. 06/28/21   [provider]  gemfibrozil (LOPID) 600 MG tablet Take 600 mg by mouth 2 (two) times daily before a meal.    [provider]  hydrALAZINE (APRESOLINE) 25 MG tablet Take 50 mg by mouth in the morning and at bedtime.     [provider]  levothyroxine (SYNTHROID) 50 MCG tablet Take 50 mcg by mouth daily before breakfast.    [provider]  LORazepam (ATIVAN) 0.5 MG tablet Take 0.25 mg by mouth at bedtime.     [provider]  meclizine (ANTIVERT) 25 MG tablet Take 25 mg by mouth 3 (three) times daily as needed for dizziness (vertigo). 02/09/11   [provider]  metaxalone (SKELAXIN) 800 MG tablet Take by mouth.    [provider]  metoprolol succinate (TOPROL-XL) 25 MG 24 hr tablet Take 12.5 mg by mouth every evening.     [provider]  Omega-3 Fatty Acids (FISH OIL) 1200 MG CAPS Take 1,200 mg by mouth in the morning and at bedtime.    [provider]  ondansetron (ZOFRAN ODT) 4 MG disintegrating tablet Take 1 tablet (4 mg total) by mouth every  8 (eight) hours as needed for nausea or vomiting. Take 30 minutes before taking Cipro 03/31/21   Lamptey, Myrene Galas, MD  ondansetron (ZOFRAN) 4 MG tablet Take by mouth.    [provider]  ondansetron (ZOFRAN) 8 MG tablet Take 1 tablet (8 mg total) by mouth every 8 (eight) hours as needed for nausea or vomiting. 05/11/20   Rai, Vernelle Emerald, MD  PFIZER-BIONT COVID-19 VAC-TRIS SUSP injection  04/17/21   [provider]  polyethylene glycol (MIRALAX / GLYCOLAX) 17 g packet Take 17 g by mouth daily as needed for mild constipation or moderate constipation. 05/11/20   Rai, Ripudeep Raliegh Ip, MD  Red Yeast Rice Extract 600 MG CAPS Take 600 mg by mouth 2 (  two) times daily.    [provider]  sodium bicarbonate 650 MG tablet Take 650 mg by mouth in the morning, at noon, and at bedtime.    [provider]  sodium bicarbonate 650 MG tablet Take by mouth.    [provider]  traMADol (ULTRAM) 50 MG tablet Take 50 mg by mouth every 12 (twelve) hours as needed for moderate pain. 10/18/13   [provider]  ursodiol (ACTIGALL) 300 MG capsule Take 300 mg by mouth 2 (two) times daily. 03/07/21   [provider]  vitamin E 180 MG (400 UNITS) capsule Take 400 Units by mouth daily.    [provider]  Wheat Dextrin (BENEFIBER DRINK MIX PO) Take 1 Scoop by mouth daily as needed (constipation).    [provider]    Allergies    Latex, Ace inhibitors, Amoxicillin, Atorvastatin, Chlorthalidone, Ciprofloxacin, Fosamax [alendronate], Other, Pravastatin, and Penicillins  Review of Systems   Review of Systems  Constitutional:  Positive for fatigue.  Respiratory:  Positive for shortness of breath.   Cardiovascular:  Negative for chest pain and leg swelling.  Gastrointestinal:  Positive for abdominal distention.  Musculoskeletal:  Positive for back pain. Negative for neck pain and neck stiffness.  Neurological:  Positive for weakness. Negative for  dizziness, syncope, facial asymmetry and light-headedness.   Physical Exam Updated Vital Signs BP (!) 183/93   Pulse 62   Temp 98.4 F (36.9 C) (Oral)   Resp 18   Ht 5\' 1"  (1.549 m)   Wt 55.3 kg   SpO2 94%   BMI 23.04 kg/m   Constitutional: Elderly female laying in bed.  No acute distress noted. HENT: Head is atraumatic without bleeding.  There is a hematoma on the left posterior aspect of the scalp. Neck: Full passive range of motion without pain.  No focal tenderness over cervical spine. Cardio: Regular rate and rhythm.  No murmurs, rubs, gallops.  JVD to angle of mandible. Pulm: Mild bibasilar crackles. Abdomen: Soft, nontender, nondistended. MSK: Decreased bulk and tone. Skin: Skin is warm and dry.  No evidence of lower extremity edema. Neuro: Mental Status: Patient is awake, alert, oriented x4 No signs of aphasia or neglect Cranial Nerves: II: Pupils equal, round, and reactive to light.   III,IV, VI: EOMI without ptosis or diploplia.  V: Facial sensation is symmetric to light touch and  Temperature. VII: Facial movement is symmetric.  VIII: hearing is intact to voice X: Uvula elevates symmetrically XI: Shoulder shrug is symmetric. XII: tongue is midline without atrophy or fasciculations.  Motor: Normal effort thorughout, at Least 5/5 bilateral UE, 5/5 bilateral lower extremitiy  Sensory: Sensation is grossly intact equally in bilateral UEs & LEs Cerebellar: Finger-Nose and Heel-Shin intact bilalat Psych: Appropriate mood and affect.  ED Results / Procedures / Treatments   Labs (all labs ordered are listed, but only abnormal results are displayed) Labs Reviewed  CBC WITH DIFFERENTIAL/PLATELET - Abnormal; Notable for the following components:      Result Value   RBC 3.58 (*)    Hemoglobin 11.0 (*)    HCT 35.2 (*)    RDW 18.6 (*)    Lymphs Abs 0.5 (*)    All other components within normal limits  BASIC METABOLIC PANEL - Abnormal; Notable for the following  components:   BUN 88 (*)    Creatinine, Ser 5.77 (*)    GFR, Estimated 7 (*)    Anion gap 16 (*)    All other components  within normal limits  BRAIN NATRIURETIC PEPTIDE - Abnormal; Notable for the following components:   B Natriuretic Peptide >4,500.0 (*)    All other components within normal limits    EKG EKG Interpretation  Date/Time:  Tuesday September 09 2021 20:32:28 EDT Ventricular Rate:  67 PR Interval:  226 QRS Duration: 160 QT Interval:  478 QTC Calculation: 505 R Axis:   -63 Text Interpretation: Sinus rhythm with 1st degree A-V block Right bundle branch block Left anterior fascicular block Bifascicular block Minimal voltage criteria for LVH, may be normal variant ( R in aVL ) Abnormal ECG When compared to prior, similar appearance. No STEMI Confirmed by Antony Blackbird [phone]) on 09/09/2021 8:45:58 PM  Radiology DG Chest 2 View  Result Date: 09/09/2021 CLINICAL DATA:  Rib pain EXAM: CHEST - 2 VIEW COMPARISON:  Radiograph 07/14/2021 FINDINGS: Unchanged cardiomediastinal silhouette. There are airspace opacities in the right lower lung, with sharp demarcation along the periphery, possibly representing a skin fold. No large pleural effusion or visible pneumothorax. No visible displaced rib fracture. Bilateral shoulder degenerative changes. Thoracic spondylosis with scoliotic curvature. Unchanged chronic midthoracic spine compression deformities. IMPRESSION: Opacities in the right lower lung with sharp demarcation peripherally, possibly a skin fold versus airspace disease, correlate for symptoms of pneumonia. No visible acute osseous abnormality. Unchanged chronic midthoracic spine compression deformities. Electronically Signed   By: Maurine Simmering M.D.   On: 09/09/2021 15:13   CT Head Wo Contrast  Result Date: 09/09/2021 CLINICAL DATA:  Head injury after fall. EXAM: CT HEAD WITHOUT CONTRAST TECHNIQUE: Contiguous axial images were obtained from the base of the skull through the vertex  without intravenous contrast. COMPARISON:  None. FINDINGS: Brain: Mild chronic ischemic white matter disease is noted. No mass effect or midline shift is noted. Ventricular size is within normal limits. There is no evidence of mass lesion, hemorrhage or acute infarction. Vascular: No hyperdense vessel or unexpected calcification. Skull: Normal. Negative for fracture or focal lesion. Sinuses/Orbits: No acute finding. Other: Small left posterior scalp hematoma is noted. IMPRESSION: Small left posterior scalp hematoma. No acute intracranial abnormality seen. Electronically Signed   By: Marijo Conception M.D.   On: 09/09/2021 15:40    Procedures None  Medications Ordered in ED Medications  acetaminophen (TYLENOL) tablet 500 mg (500 mg Oral Given 09/09/21 2009)  Tdap (BOOSTRIX) injection 0.5 mL (0.5 mLs Intramuscular Given 09/09/21 2009)    ED Course  I have reviewed the triage vital signs and the nursing notes.  Pertinent labs & imaging results that were available during my care of the patient were reviewed by me and considered in my medical decision making (see chart for details).    MDM Rules/Calculators/A&P                           Patient reported after a fall resulting in head strike.  Initial work-up including CT head was negative for acute intracranial processes, however did show left hematoma.  Neuro exam was normal at this time.  Given reports of worsening dyspnea, fatigue, renal function, abdominal distention, there is concern for acute on chronic heart failure exacerbation.  Repeat EKG was done and showed no significant changes from prior tracings.  BNP was elevated at> 4500, however this is unchanged from previous values.  Patient was ambulated and maintain oxygen saturations above 90%, however was limited by leg weakness which is baseline for her.  Unfortunately, patient is in CKD stage IV with BUN/creatinine  88/5.77 and her daughter states that her care team has been working diligently to  find a balance between her kidney disease and heart failure.  Patient has been recommended to have close follow-up with her care team including primary care, cardiology, and nephrology.  Final Clinical Impression(s) / ED Diagnoses Final diagnoses:  Hematoma of scalp, initial encounter  Acute on chronic congestive heart failure, unspecified heart failure type [facility])    Rx / DC Orders ED Discharge Orders     None        Fredda, Clarida, DO 09/09/21 2216    Tegeler, Gwenyth Allegra, MD 09/10/21 [phone]

## 2021-09-09 NOTE — ED Notes (Signed)
Patient verbalizes understanding of discharge instructions. Opportunity for questioning and answers were provided. Armband removed by staff, pt discharged from ED. Wheeled out to lobby  

## 2021-09-09 NOTE — ED Provider Notes (Signed)
Emergency Medicine Provider Triage Evaluation Note  Alicia Sellers , a 87 y.o. female  was evaluated in triage.  Pt complains of fall in bathroom today. No thinners, afib hx, seizures  Review of Systems  Positive: Headache, rib pain Negative: SOB  Physical Exam  BP (!) 178/75   Pulse 64   Temp 98.4 F (36.9 C) (Oral)   Resp 18   Ht 5\' 1"  (1.549 m)   Wt 55.3 kg   SpO2 97%   BMI 23.04 kg/m  Gen:   Awake, no distress   Resp:  Normal effort  MSK:   Moves extremities without difficulty  Other:  Left posterior skull laceration tenderness to right lateral rib cage.  Nontender belly.  Medical Decision Making  Medically screening exam initiated at 2:10 PM.  Appropriate orders placed.  Alicia Sellers was informed that the remainder of the evaluation will be completed by another provider, this initial triage assessment does not replace that evaluation, and the importance of remaining in the ED until their evaluation is complete.     Rhae Hammock, PA-C 09/09/21 1412    Lorelle Gibbs, DO 09/09/21 1427

## 2021-09-09 NOTE — ED Triage Notes (Signed)
Pt from home with ems for mechanical fall. Pt was in the bathroom, she hit her left flank area on the counter and hit the back of her head, hematoma noted to her head. Pt is NOT on a blood thinner. EMS reports diminished lung sounds on the left and pt c.o some sob. 93% on room air. Pt a.o, no LOC during fall.   Family also reports pt has gained a lot of weight in her stomach lately. Hx of CHF.

## 2021-09-09 NOTE — Discharge Instructions (Addendum)
Today you were evaluated in the ED after losing her balance and falling causing you to hit your head.  At that time, a CT of your head was done which showed no acute intracranial abnormality but did show a small left scalp hematoma on the left posterior aspect of your head. During this time you also expressed concerns regarding progressive abdominal extension and worsening of fatigue and shortness of breath.  Given your history of heart failure and kidney disease, a series of tests were ordered including a metabolic panel which showed mild worsening in your kidney function.  We also checked a BNP level which is often elevated in the heart failure.  Your level was elevated, however it was unchanged from previous levels.  We are reassured that you are maintaining your oxygen levels at rest and when you get up to walk.  We recommend that you return to the ED for reevaluation if you note progressive worsening of your shortness of breath.  We also recommend that you return if you have changes in your mentation, severe headache, fainting.  We advise close follow-up with your cardiologist, nephrologist, and primary care physician.

## 2021-09-09 NOTE — ED Notes (Signed)
Pt could only ambulate about 61ft d/t leg weakness, sats dipped to 92% on RA

## 2021-09-10 ENCOUNTER — Ambulatory Visit (INDEPENDENT_AMBULATORY_CARE_PROVIDER_SITE_OTHER): Payer: Medicare HMO | Admitting: Otolaryngology

## 2021-09-12 ENCOUNTER — Other Ambulatory Visit: Payer: Self-pay

## 2021-09-12 ENCOUNTER — Encounter (HOSPITAL_COMMUNITY)
Admission: RE | Admit: 2021-09-12 | Discharge: 2021-09-12 | Disposition: A | Discharge status: Home / Self Care | Source: Ambulatory Visit | Attending: Nephrology | Admitting: Nephrology

## 2021-09-12 DIAGNOSIS — N184 Chronic kidney disease, stage 4 (severe): Secondary | ICD-10-CM | POA: Insufficient documentation

## 2021-09-12 DIAGNOSIS — D631 Anemia in chronic kidney disease: Secondary | ICD-10-CM | POA: Insufficient documentation

## 2021-09-12 LAB — POCT HEMOGLOBIN-HEMACUE: Hemoglobin: 10.7 g/dL — ABNORMAL LOW (ref 12.0–15.0)

## 2021-09-12 MED ORDER — EPOETIN ALFA 20000 UNIT/ML IJ SOLN
INTRAMUSCULAR | Status: AC
  Administered 2021-09-12: 20000 [IU] via SUBCUTANEOUS
  Filled 2021-09-12: qty 1

## 2021-09-17 ENCOUNTER — Other Ambulatory Visit: Payer: Self-pay | Admitting: *Deleted

## 2021-09-17 NOTE — Patient Outreach (Signed)
[facility]) Care Management  09/17/2021  Alicia Sellers 06-Jan-1929 [id number]   [facility] Case closure    [name] was referred to [facility] on 03/25/21 by [facility] hospital liaison for post hospital stay/complex care services     Insurance [employer] Last admission: 03/22/21-03/24/21 Acute on chronic systolic heart failure, Dyspnea, Chronic Kidney disease (CKD) stage 5 ED visits 06/18/21 for lower abdominal pain and 09/09/21 for hematoma of scalp after a fall   Unsuccessful outreach on 08/15/21 08/29/21, 09/03/21 Unsuccessful outreach letter sent on 08/29/21   Plan [facility] RN CM will close case after no response from patient within 10 business days. Case closure letters sent to patient and MD    Joelene Millin L. Lavina Hamman, RN, BSN, [place] Coordinator Office number [phone] Mobile number [phone]  Main [facility] number [phone] Fax number [phone]

## 2021-09-19 ENCOUNTER — Encounter (HOSPITAL_COMMUNITY): Payer: Medicare HMO

## 2021-09-19 ENCOUNTER — Other Ambulatory Visit: Payer: Self-pay

## 2021-09-19 ENCOUNTER — Encounter (HOSPITAL_COMMUNITY)
Admission: RE | Admit: 2021-09-19 | Discharge: 2021-09-19 | Disposition: A | Discharge status: Home / Self Care | Source: Ambulatory Visit | Attending: Nephrology | Admitting: Nephrology

## 2021-09-19 VITALS — BP 127/72 | HR 103 | Temp 97.5°F

## 2021-09-19 DIAGNOSIS — N184 Chronic kidney disease, stage 4 (severe): Secondary | ICD-10-CM | POA: Diagnosis not present

## 2021-09-19 DIAGNOSIS — D631 Anemia in chronic kidney disease: Secondary | ICD-10-CM

## 2021-09-19 LAB — FERRITIN: Ferritin: 185 ng/mL (ref 11–307)

## 2021-09-19 LAB — IRON AND TIBC
Iron: 77 ug/dL (ref 28–170)
Saturation Ratios: 28 % (ref 10.4–31.8)
TIBC: 280 ug/dL (ref 250–450)
UIBC: 203 ug/dL

## 2021-09-19 LAB — POCT HEMOGLOBIN-HEMACUE: Hemoglobin: 10.5 g/dL — ABNORMAL LOW (ref 12.0–15.0)

## 2021-09-19 MED ORDER — EPOETIN ALFA 20000 UNIT/ML IJ SOLN
INTRAMUSCULAR | Status: AC
  Filled 2021-09-19: qty 1

## 2021-09-19 MED ORDER — EPOETIN ALFA 20000 UNIT/ML IJ SOLN
20000.0000 [IU] | INTRAMUSCULAR | Status: DC
  Administered 2021-09-19: 20000 [IU] via SUBCUTANEOUS

## 2021-09-23 ENCOUNTER — Ambulatory Visit: Payer: Medicare HMO | Admitting: Podiatry

## 2021-09-26 ENCOUNTER — Encounter (HOSPITAL_COMMUNITY): Payer: Medicare HMO

## 2021-10-07 DEATH — deceased

## 2022-04-20 IMAGING — CR DG CHEST 2V
2 series · 2 of 2 positions shown · non-contrast
Comparison: Chest x-rays dated 01/10/2021 and 05/08/2020

CLINICAL DATA: Shortness of breath.

EXAM:
CHEST - 2 VIEW

[w chest lat]
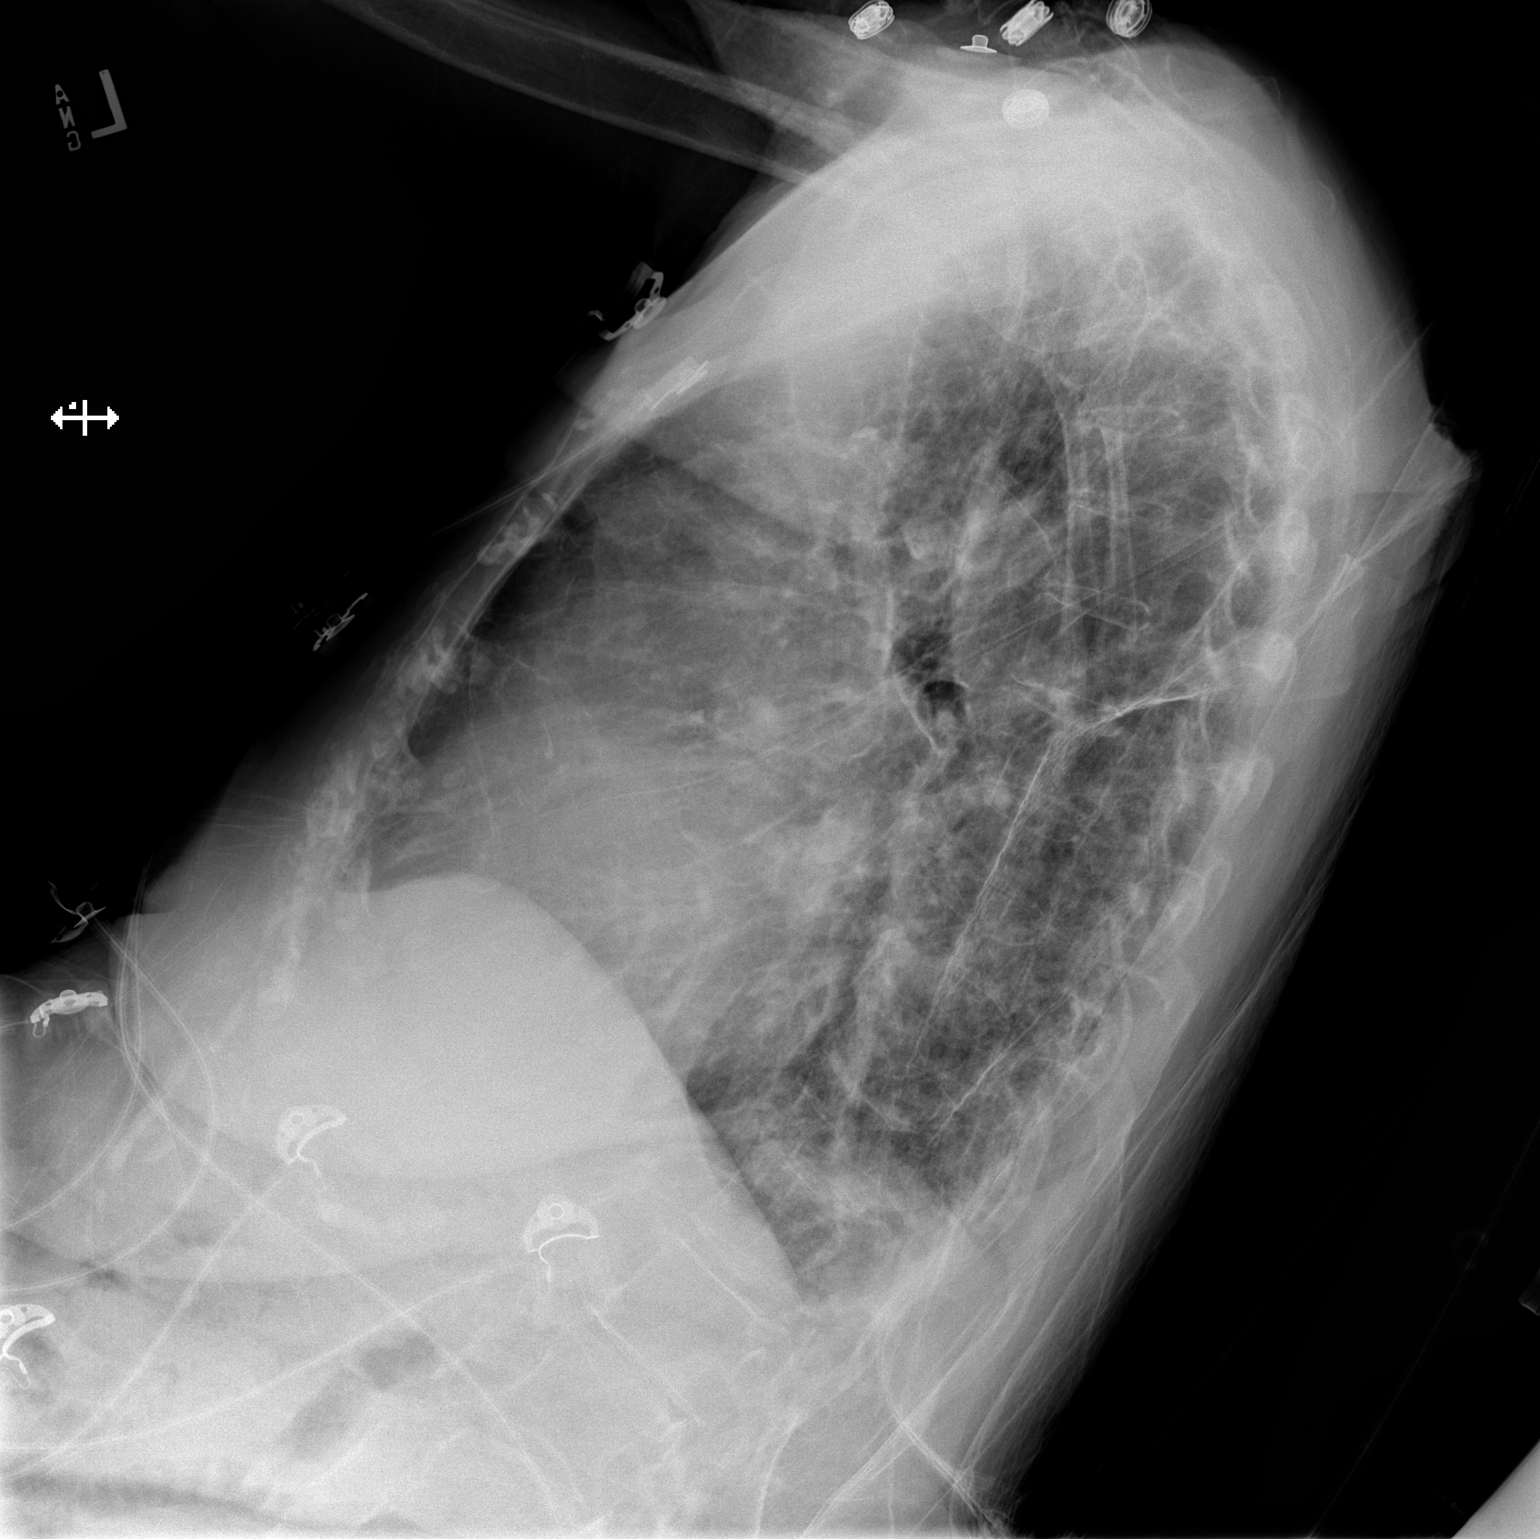

[x chest ap]
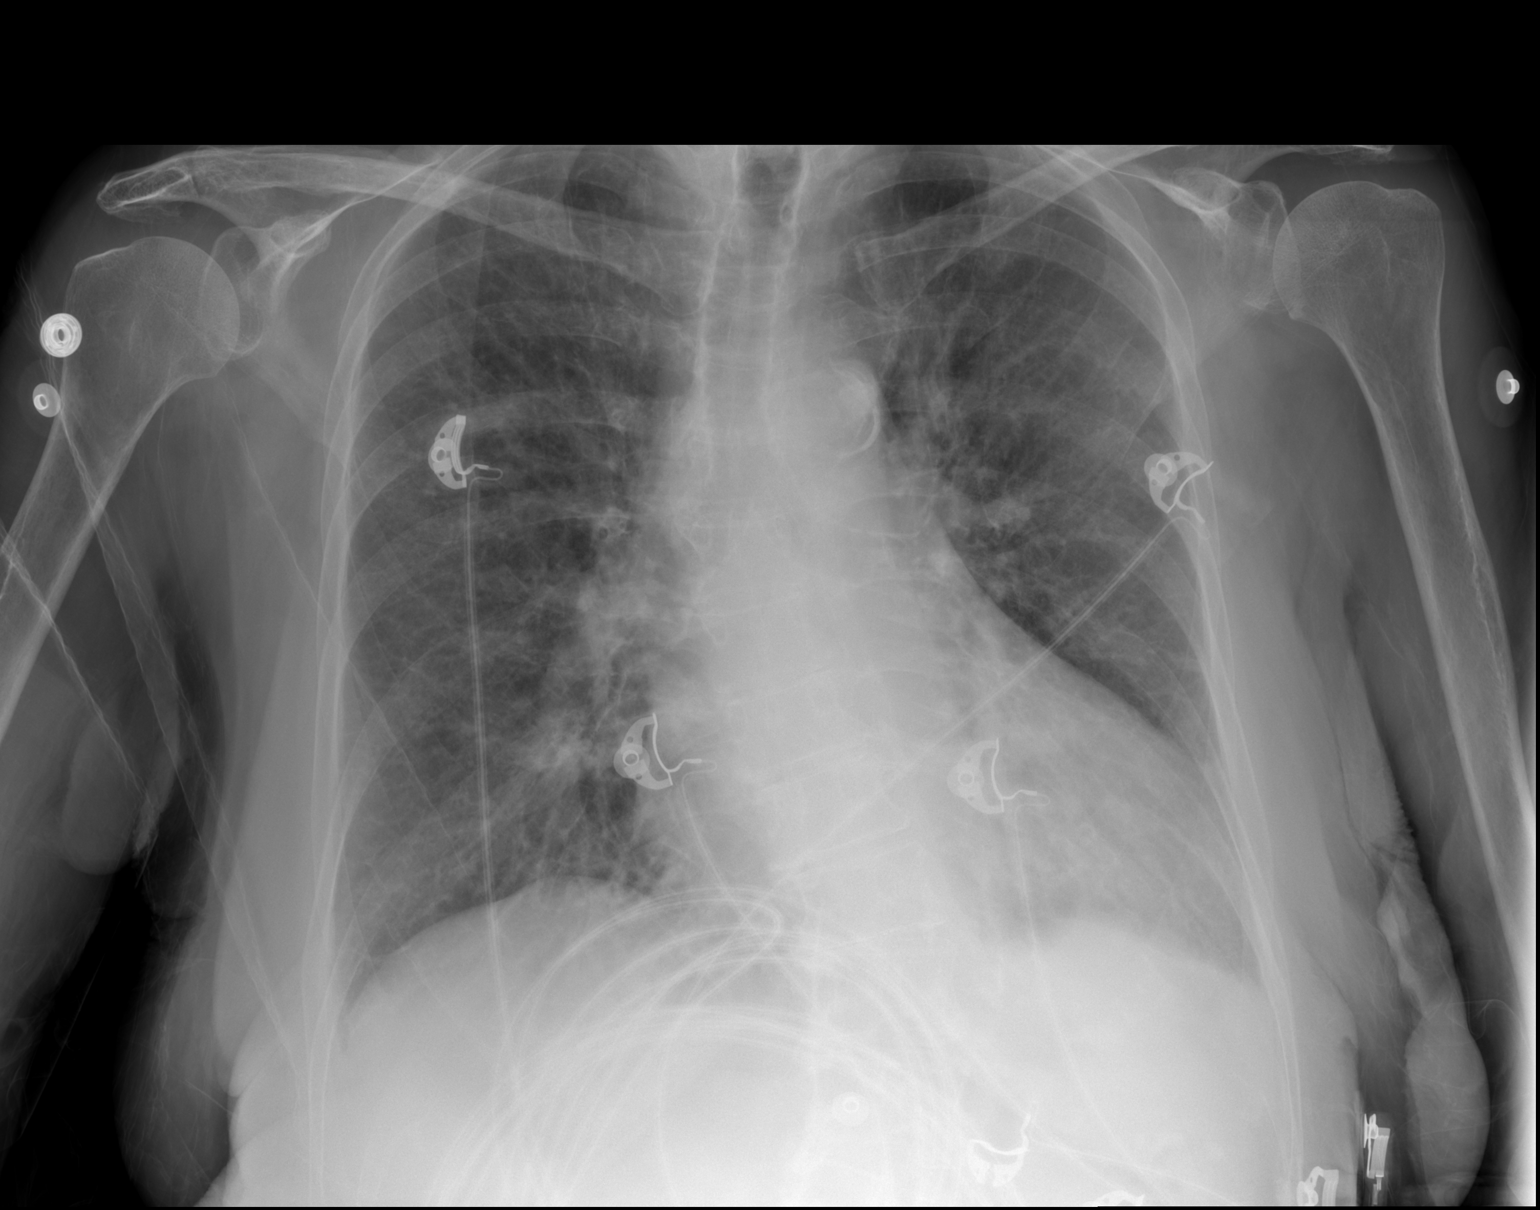

[2 of 2 positions shown; findings below may reference images not displayed]

FINDINGS: Borderline cardiomegaly, stable. Chronic bronchitic changes
centrally. No confluent opacity to suggest a developing pneumonia.
No pleural effusion or pneumothorax is seen. Compression deformity
of a vertebral body in the mid/lower thoracic spine, of uncertain
age but not convincingly seen on previous study suggesting that it
is new.
IMPRESSION: 1. No active cardiopulmonary disease. No evidence of pneumonia or
pulmonary edema.
2. Chronic bronchitic changes.
3. Questionable new compression deformity of a vertebral body in the
mid/lower thoracic spine, of uncertain age and possibly artifactual
due to patient's pronounced scoliosis at the thoracolumbar junction.
If any recent trauma or midline back pain, consider dedicated plain
film of the thoracic spine for further characterization.

## 2022-04-20 IMAGING — CR DG THORACIC SPINE 2V
3 series · 3 of 3 positions shown · non-contrast
Comparison: Chest x-ray March 22, 2021, chest x-ray May 08, 2020

CLINICAL DATA: Shortness of breath on exertion. Assess for possible
compression deformity of vertebral body in the midthoracic spine.

EXAM:
THORACIC SPINE 2 VIEWS

[t thoracic spine ap]
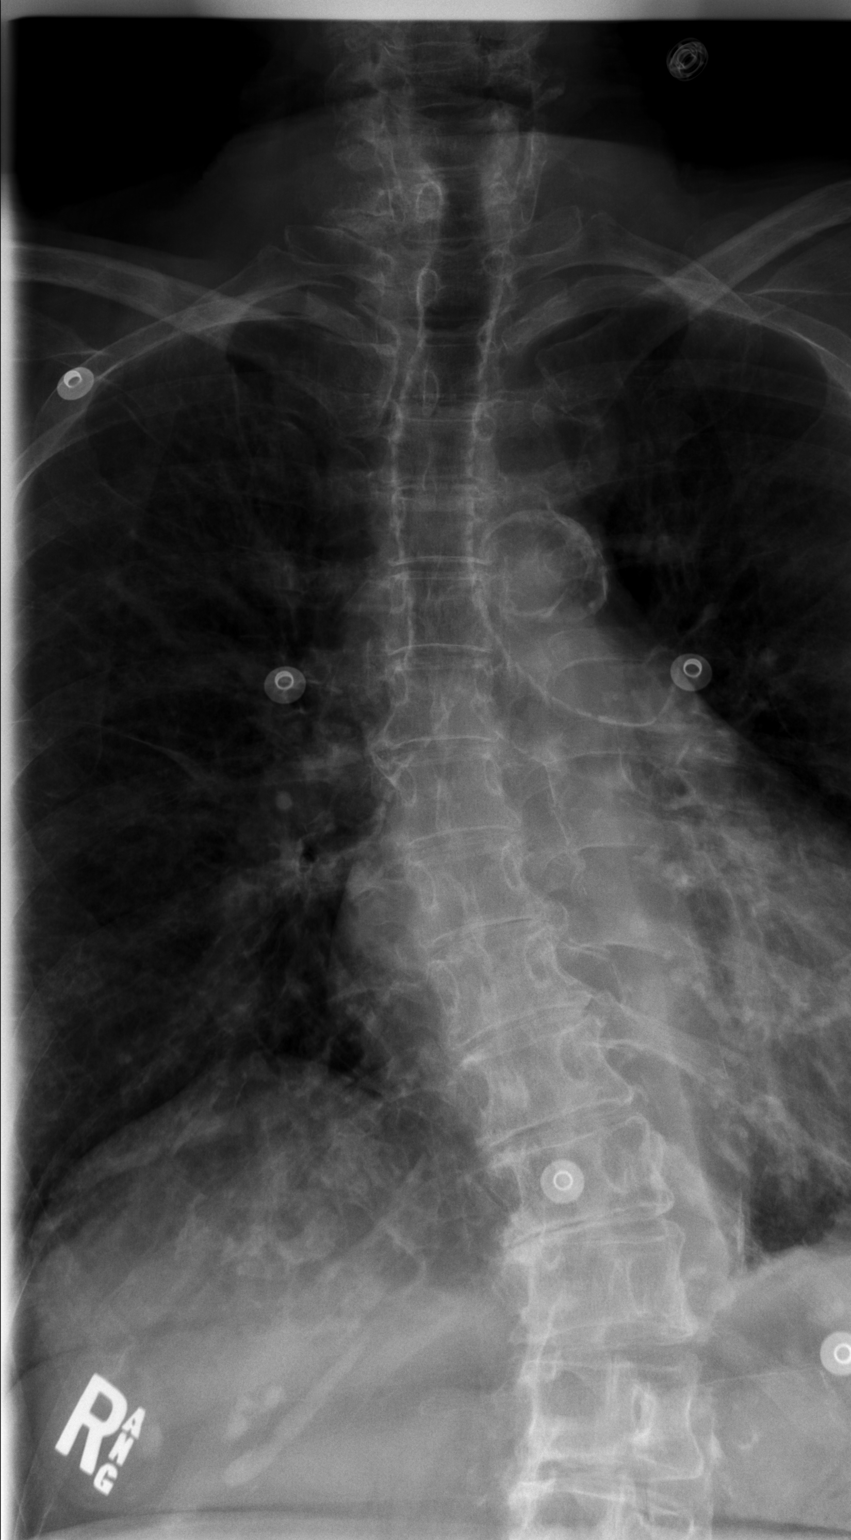

[t thoracic spine lat]
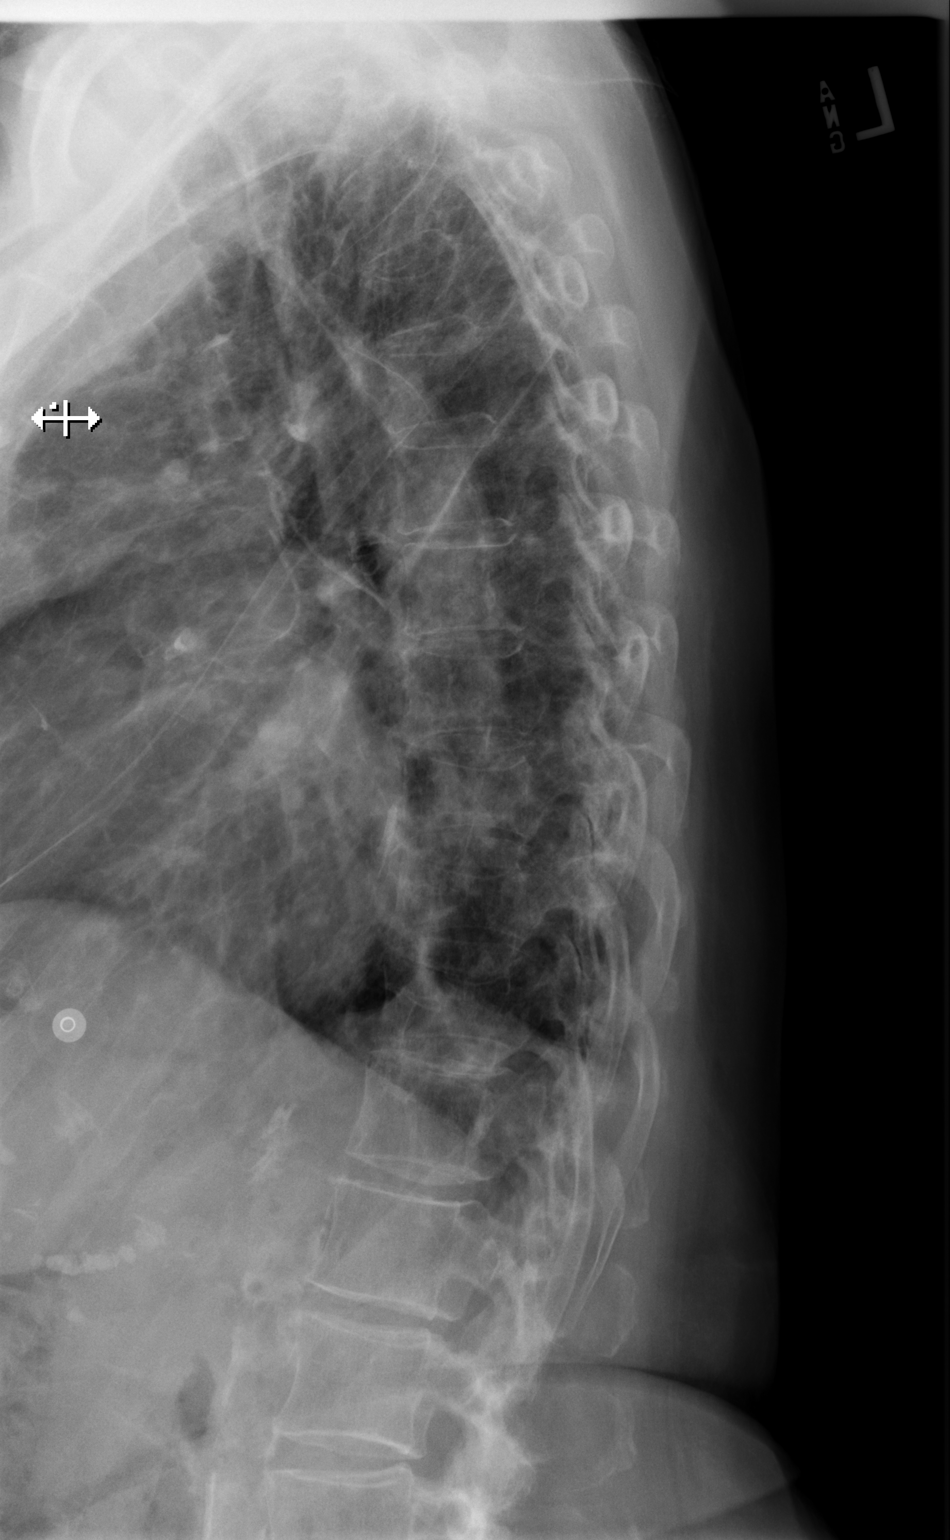

[t thoracic swimmers]
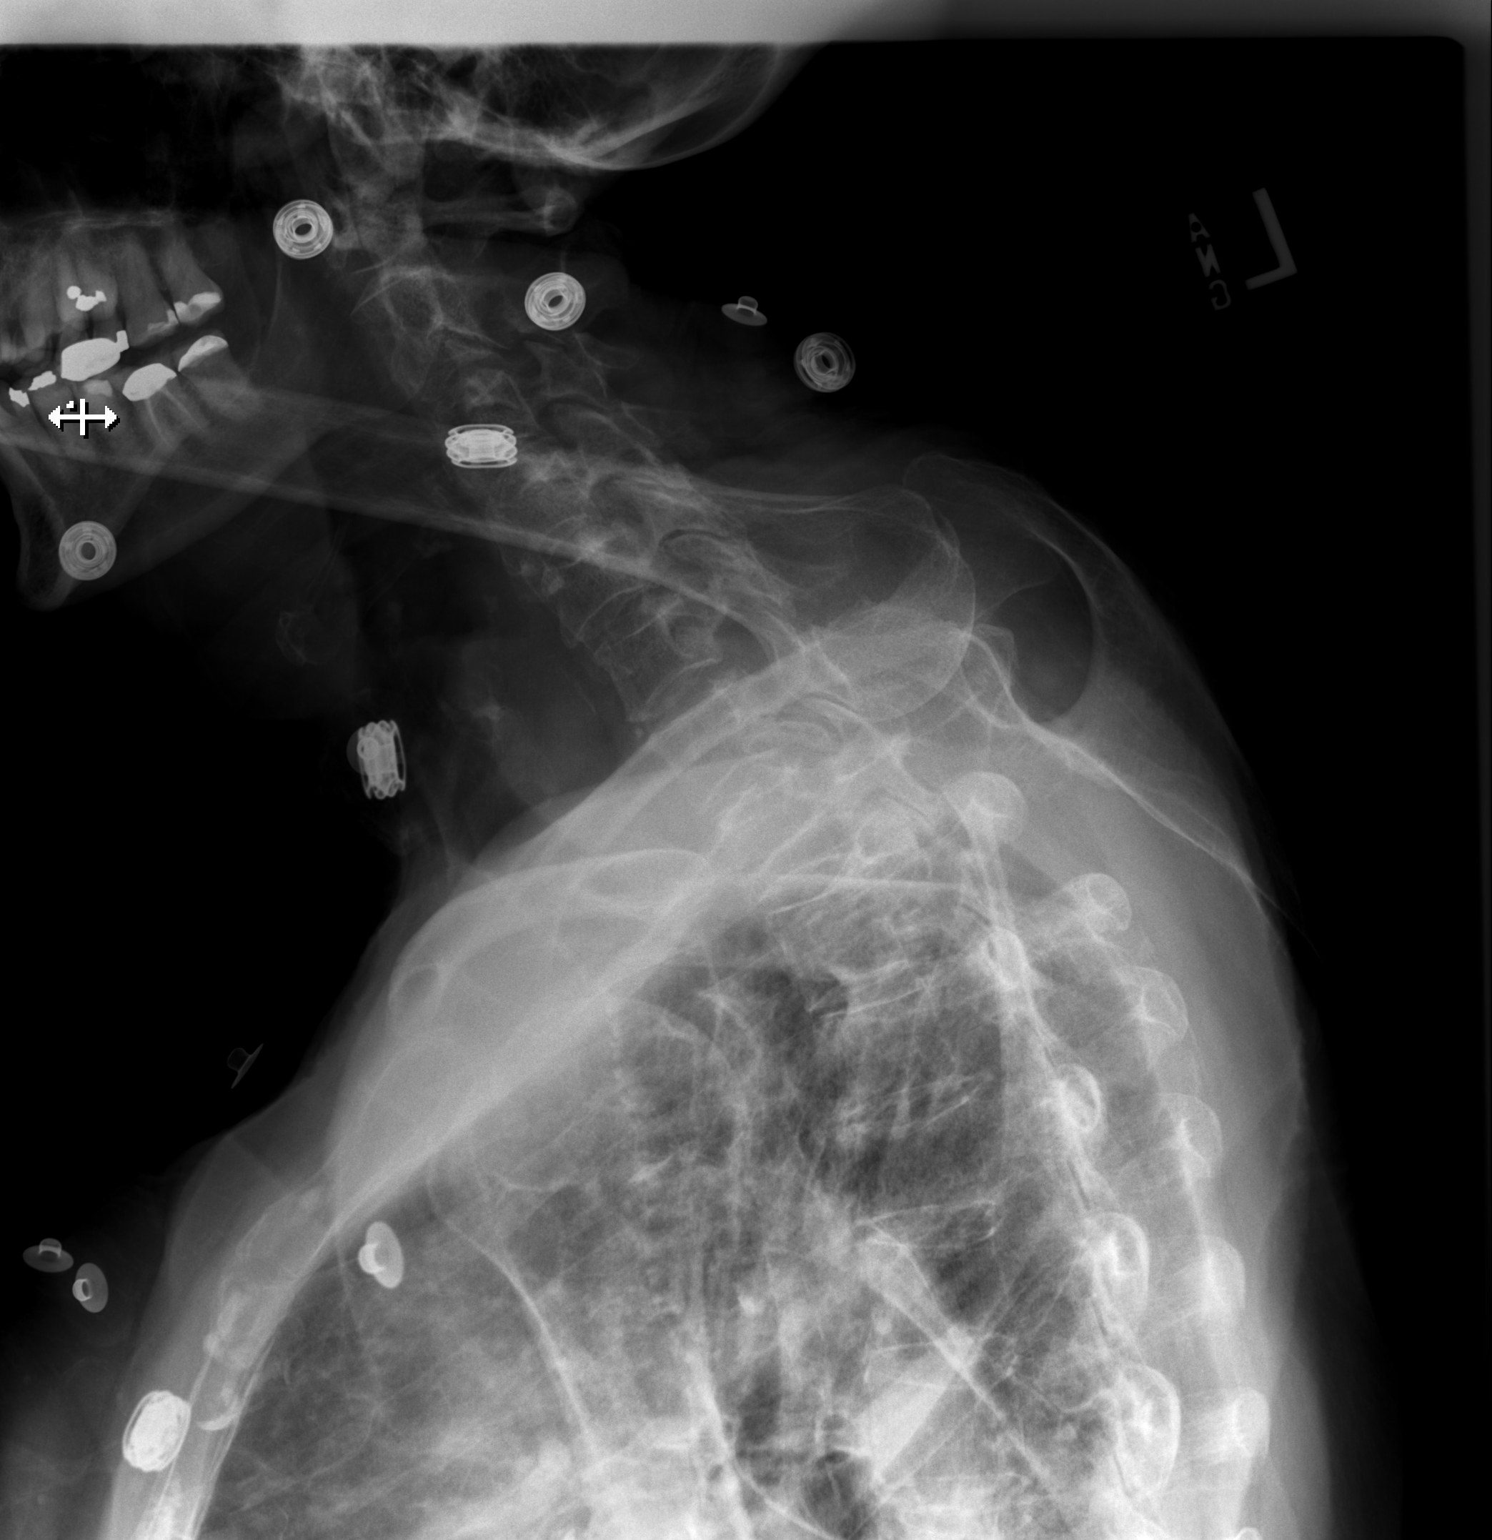

[3 of 3 positions shown; findings below may reference images not displayed]

FINDINGS: Probable chronic compression deformities of at least 3 mid to lower
thoracic vertebral bodies are identified without cortical
displacement. These findings are minimally worsened compared to
prior chest x-ray May 08, 2020. There is no dislocation.
Scoliosis of the spine is identified.
IMPRESSION: Probable chronic compression deformities of at least 3 mid to lower
thoracic vertebral bodies are identified without cortical
displacement. These findings are minimally worsened compared to
prior chest x-ray May 08, 2020.
